# Patient Record
Sex: Male | Born: 1945 | Race: White | Hispanic: No | Marital: Married | Smoking: Former smoker
Health system: Southern US, Community
[De-identification: ages and names within clinical notes are randomized; demographics above are authoritative.]

## PROBLEM LIST (undated history)

## (undated) DIAGNOSIS — J45909 Unspecified asthma, uncomplicated: Secondary | ICD-10-CM

## (undated) DIAGNOSIS — I351 Nonrheumatic aortic (valve) insufficiency: Secondary | ICD-10-CM

## (undated) DIAGNOSIS — I38 Endocarditis, valve unspecified: Secondary | ICD-10-CM

## (undated) DIAGNOSIS — I471 Supraventricular tachycardia, unspecified: Secondary | ICD-10-CM

## (undated) DIAGNOSIS — I340 Nonrheumatic mitral (valve) insufficiency: Secondary | ICD-10-CM

## (undated) DIAGNOSIS — J309 Allergic rhinitis, unspecified: Secondary | ICD-10-CM

## (undated) DIAGNOSIS — K219 Gastro-esophageal reflux disease without esophagitis: Secondary | ICD-10-CM

## (undated) DIAGNOSIS — D649 Anemia, unspecified: Secondary | ICD-10-CM

## (undated) DIAGNOSIS — R011 Cardiac murmur, unspecified: Secondary | ICD-10-CM

## (undated) DIAGNOSIS — I2510 Atherosclerotic heart disease of native coronary artery without angina pectoris: Secondary | ICD-10-CM

## (undated) DIAGNOSIS — N529 Male erectile dysfunction, unspecified: Secondary | ICD-10-CM

## (undated) DIAGNOSIS — I10 Essential (primary) hypertension: Secondary | ICD-10-CM

## (undated) DIAGNOSIS — Z8614 Personal history of Methicillin resistant Staphylococcus aureus infection: Secondary | ICD-10-CM

## (undated) HISTORY — DX: Essential (primary) hypertension: I10

## (undated) HISTORY — DX: Nonrheumatic aortic (valve) insufficiency: I35.1

## (undated) HISTORY — DX: Endocarditis, valve unspecified: I38

## (undated) HISTORY — DX: Anemia, unspecified: D64.9

## (undated) HISTORY — DX: Male erectile dysfunction, unspecified: N52.9

## (undated) HISTORY — PX: CATARACT EXTRACTION W/PHACO: SHX586

## (undated) HISTORY — PX: CARDIAC CATHETERIZATION: SHX172

## (undated) HISTORY — DX: Atherosclerotic heart disease of native coronary artery without angina pectoris: I25.10

## (undated) HISTORY — DX: Supraventricular tachycardia, unspecified: I47.10

## (undated) HISTORY — DX: Allergic rhinitis, unspecified: J30.9

## (undated) HISTORY — PX: ANAL FISSURE REPAIR: SHX2312

## (undated) HISTORY — DX: Unspecified asthma, uncomplicated: J45.909

## (undated) HISTORY — PX: COLONOSCOPY: SHX174

## (undated) HISTORY — PX: TONSILLECTOMY: SUR1361

## (undated) HISTORY — DX: Personal history of Methicillin resistant Staphylococcus aureus infection: Z86.14

## (undated) HISTORY — DX: Nonrheumatic mitral (valve) insufficiency: I34.0

## (undated) HISTORY — PX: OTHER SURGICAL HISTORY: SHX169

---

## 2005-10-03 ENCOUNTER — Emergency Department: Payer: Self-pay | Admitting: Emergency Medicine

## 2010-06-18 ENCOUNTER — Ambulatory Visit: Payer: Self-pay | Admitting: Cardiology

## 2010-07-08 DIAGNOSIS — I2510 Atherosclerotic heart disease of native coronary artery without angina pectoris: Secondary | ICD-10-CM

## 2010-07-08 HISTORY — DX: Atherosclerotic heart disease of native coronary artery without angina pectoris: I25.10

## 2014-01-25 ENCOUNTER — Ambulatory Visit: Payer: Self-pay | Admitting: Unknown Physician Specialty

## 2015-05-28 ENCOUNTER — Telehealth: Payer: Self-pay

## 2015-05-28 NOTE — Telephone Encounter (Signed)
Received notes from Dr. Daniel Nones, pt needs NP appt. .L MOM to return the call.

## 2015-07-12 ENCOUNTER — Ambulatory Visit (INDEPENDENT_AMBULATORY_CARE_PROVIDER_SITE_OTHER): Payer: Medicare Other | Admitting: Cardiovascular Disease

## 2015-07-12 ENCOUNTER — Encounter: Payer: Self-pay | Admitting: Cardiovascular Disease

## 2015-07-12 VITALS — BP 120/70 | HR 66 | Ht 68.0 in | Wt 197.5 lb

## 2015-07-12 DIAGNOSIS — I10 Essential (primary) hypertension: Secondary | ICD-10-CM

## 2015-07-12 DIAGNOSIS — I341 Nonrheumatic mitral (valve) prolapse: Secondary | ICD-10-CM

## 2015-07-12 DIAGNOSIS — I2510 Atherosclerotic heart disease of native coronary artery without angina pectoris: Secondary | ICD-10-CM

## 2015-07-12 DIAGNOSIS — E785 Hyperlipidemia, unspecified: Secondary | ICD-10-CM

## 2015-07-12 DIAGNOSIS — I340 Nonrheumatic mitral (valve) insufficiency: Secondary | ICD-10-CM

## 2015-07-12 NOTE — Assessment & Plan Note (Signed)
Given the presence of mild nonobstructive coronary artery disease, I agree with treatment with a statin with a target LDL of less than 70.

## 2015-07-12 NOTE — Progress Notes (Signed)
Primary care physician: Dr. Daniel Nones  HPI  This is a pleasant 69 year old male who is here today to establish cardiovascular care regarding moderate regurgitation due to mitral valve prolapse. He used to be followed by Dr. Gwen Pounds. He reports prolonged history of mitral regurgitation and a heart murmur. He was evaluated in 2011 at the [facility] clinic. It was concluded that his mitral regurgitation was moderate and not severe and thus surgery was not needed. Cardiac catheterization showed mild three-vessel coronary artery disease at that time. Other medical problems include hypertension. Most recent noninvasive evaluation was in April 2016 which included a stress echocardiogram. The patient exercised for 5 minutes and 41 seconds with no evidence of ischemia and normal ejection fraction. Mitral regurgitation was described as moderate but this was not a full echocardiogram study. The patient overall denies any chest pain, shortness of breath or fatigue. He is physically very active and plays golf on a regular basis. He can walk 5 miles without significant limitations. Family history reveals no premature coronary artery disease. He is not a smoker. 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  No Known Allergies   Current Outpatient Prescriptions on File Prior to Visit  Medication Sig Dispense Refill  . albuterol (PROAIR HFA) 108 (90 BASE) MCG/ACT inhaler Inhale 1 puff into the lungs every 4 (four) hours as needed.     Marland Kitchen amLODipine (NORVASC) 5 MG tablet TAKE 1 TABLET ONE TIME DAILY    . aspirin EC 81 MG tablet Take 81 mg by mouth daily.     . bisoprolol (ZEBETA) 10 MG tablet TAKE 1 TABLET EVERY DAY    . Cetirizine HCl 10 MG CAPS Take 10 mg by mouth daily.     . Coenzyme Q10 Liposomal 100 MG/ML LIQD Take by mouth.    Marland Kitchen lisinopril (PRINIVIL,ZESTRIL) 40 MG tablet TAKE 1 TABLET ONE TIME DAILY    . lovastatin (MEVACOR) 40 MG tablet TAKE 1 TABLET EVERY NIGHT    . mometasone (ASMANEX 60 METERED  DOSES) 220 MCG/INH inhaler Inhale 2 puffs into the lungs daily.     . sodium chloride (OCEAN) 0.65 % nasal spray by Nasal route twice daily.    . tadalafil (CIALIS) 5 MG tablet Take 5 mg by mouth daily as needed.      No current facility-administered medications on file prior to visit.     Past Medical History  Diagnosis Date  . Allergic rhinitis   . Anemia   . Asthma   . ED (erectile dysfunction)   . History of MRSA infection   . Hypertension   . Mitral regurgitation   . Valvular heart disease   . Coronary artery disease 11/11    Diffuse mild 3 vessell CAD by cardiac cath @ the [facility]     Past Surgical History  Procedure Laterality Date  . Colonoscopy    . Anal fissure repair    . Wisdom teeth resected    . Tonsillectomy    . Cardiac catheterization      [facility]     Family History  Problem Relation Age of Onset  . Stroke Mother   . Dementia Mother   . AAA (abdominal aortic aneurysm) Father   . Heart attack Father      Social History   Social History  . Marital Status: Married    Spouse Name: N/A  . Number of Children: N/A  . Years of Education: N/A   Occupational History  . Not on file.  Social History Main Topics  . Smoking status: Former Smoker -- 13 years    Types: Cigarettes  . Smokeless tobacco: Not on file  . Alcohol Use: Yes  . Drug Use: No  . Sexual Activity: Not on file   Other Topics Concern  . Not on file   Social History Narrative     ROS A 10 point review of system was performed. It is negative other than that mentioned in the history of present illness.   PHYSICAL EXAM   BP 120/70 mmHg  Pulse 66  Ht 5\' 8"  (1.727 m)  Wt 197 lb 8 oz (89.585 kg)  BMI 30.04 kg/m2 Constitutional: He is oriented to person, place, and time. He appears well-developed and well-nourished. No distress.  HENT: No nasal discharge.  Head: Normocephalic and atraumatic.  Eyes: Pupils are equal and round.  No discharge. Neck:  Normal range of motion. Neck supple. No JVD present. No thyromegaly present.  Cardiovascular: Normal rate, regular rhythm, normal heart sounds. Exam reveals no gallop and no friction rub. There is a 3/6 holosystolic murmur at the apex radiating to the axilla and the base of the heart.  Pulmonary/Chest: Effort normal and breath sounds normal. No stridor. No respiratory distress. He has no wheezes. He has no rales. He exhibits no tenderness.  Abdominal: Soft. Bowel sounds are normal. He exhibits no distension. There is no tenderness. There is no rebound and no guarding.  Musculoskeletal: Normal range of motion. He exhibits no edema and no tenderness.  Neurological: He is alert and oriented to person, place, and time. Coordination normal.  Skin: Skin is warm and dry. No rash noted. He is not diaphoretic. No erythema. No pallor.  Psychiatric: He has a normal mood and affect. His behavior is normal. Judgment and thought content normal.       EKG: Normal sinus rhythm with PACs.   ASSESSMENT AND PLAN

## 2015-07-12 NOTE — Assessment & Plan Note (Signed)
Blood pressure is controlled

## 2015-07-12 NOTE — Assessment & Plan Note (Signed)
This is due to known mitral valve prolapse. The patient is completely asymptomatic. I recommend an echocardiogram in 6 months to establish a baseline in our office. Infective endocarditis prophylaxis is not recommended in this condition.

## 2015-07-12 NOTE — Patient Instructions (Addendum)
Medication Instructions:  Your physician recommends that you continue on your current medications as directed. Please refer to the Current Medication list given to you today.   Labwork: none  Testing/Procedures: Your physician has requested that you have an echocardiogram in six months. Echocardiography is a painless test that uses sound waves to create images of your heart. It provides your doctor with information about the size and shape of your heart and how well your heart's chambers and valves are working. This procedure takes approximately one hour. There are no restrictions for this procedure.    Follow-Up: Your physician wants you to follow-up in: six months with Dr. Kirke Corin.  You will receive a reminder letter in the mail two months in advance. If you don't receive a letter, please call our office to schedule the follow-up appointment.   Any Other Special Instructions Will Be Listed Below (If Applicable).     If you need a refill on your cardiac medications before your next appointment, please call your pharmacy.  Echocardiogram An echocardiogram, or echocardiography, uses sound waves (ultrasound) to produce an image of your heart. The echocardiogram is simple, painless, obtained within a short period of time, and offers valuable information to your health care provider. The images from an echocardiogram can provide information such as:  Evidence of coronary artery disease (CAD).  Heart size.  Heart muscle function.  Heart valve function.  Aneurysm detection.  Evidence of a past heart attack.  Fluid buildup around the heart.  Heart muscle thickening.  Assess heart valve function. LET [facility] CARE PROVIDER KNOW ABOUT:  Any allergies you have.  All medicines you are taking, including vitamins, herbs, eye drops, creams, and over-the-counter medicines.  Previous problems you or members of your family have had with the use of anesthetics.  Any blood disorders  you have.  Previous surgeries you have had.  Medical conditions you have.  Possibility of pregnancy, if this applies. BEFORE THE PROCEDURE  No special preparation is needed. Eat and drink normally.  PROCEDURE   In order to produce an image of your heart, gel will be applied to your chest and a wand-like tool (transducer) will be moved over your chest. The gel will help transmit the sound waves from the transducer. The sound waves will harmlessly bounce off your heart to allow the heart images to be captured in real-time motion. These images will then be recorded.  You may need an IV to receive a medicine that improves the quality of the pictures. AFTER THE PROCEDURE You may return to your normal schedule including diet, activities, and medicines, unless your health care provider tells you otherwise.   This information is not intended to replace advice given to you by your health care provider. Make sure you discuss any questions you have with your health care provider.   Document Released: 08/21/2000 Document Revised: 09/14/2014 Document Reviewed: 05/01/2013 Elsevier Interactive Patient Education [employer]

## 2015-07-12 NOTE — Assessment & Plan Note (Signed)
He has no anginal symptoms and I agree with current medical therapy.

## 2016-02-11 ENCOUNTER — Other Ambulatory Visit: Payer: Self-pay

## 2016-02-11 ENCOUNTER — Ambulatory Visit (INDEPENDENT_AMBULATORY_CARE_PROVIDER_SITE_OTHER): Payer: Medicare Other

## 2016-02-11 DIAGNOSIS — I341 Nonrheumatic mitral (valve) prolapse: Secondary | ICD-10-CM

## 2016-03-06 DIAGNOSIS — E538 Deficiency of other specified B group vitamins: Secondary | ICD-10-CM | POA: Insufficient documentation

## 2016-03-26 ENCOUNTER — Encounter: Payer: Self-pay | Admitting: Cardiovascular Disease

## 2016-03-26 ENCOUNTER — Ambulatory Visit (INDEPENDENT_AMBULATORY_CARE_PROVIDER_SITE_OTHER): Payer: Medicare Other | Admitting: Cardiovascular Disease

## 2016-03-26 ENCOUNTER — Encounter (INDEPENDENT_AMBULATORY_CARE_PROVIDER_SITE_OTHER): Payer: Self-pay

## 2016-03-26 VITALS — BP 140/80 | HR 61 | Ht 68.0 in | Wt 195.4 lb

## 2016-03-26 DIAGNOSIS — I341 Nonrheumatic mitral (valve) prolapse: Secondary | ICD-10-CM | POA: Diagnosis not present

## 2016-03-26 DIAGNOSIS — E785 Hyperlipidemia, unspecified: Secondary | ICD-10-CM | POA: Diagnosis not present

## 2016-03-26 DIAGNOSIS — I10 Essential (primary) hypertension: Secondary | ICD-10-CM

## 2016-03-26 DIAGNOSIS — I2510 Atherosclerotic heart disease of native coronary artery without angina pectoris: Secondary | ICD-10-CM | POA: Diagnosis not present

## 2016-03-26 NOTE — Patient Instructions (Signed)
Medication Instructions: Continue same medications.   Labwork: None.   Procedures/Testing: None.   Follow-Up: 1 year with Dr. Payton Prinsen.   Any Additional Special Instructions Will Be Listed Below (If Applicable).     If you need a refill on your cardiac medications before your next appointment, please call your pharmacy.   

## 2016-03-26 NOTE — Progress Notes (Signed)
Cardiology Office Note   Date:  03/26/2016   ID:  Connor Tyler, DOB 09-04-1946, MRN [id number]  PCP:  Lynnea Ferrier, MD  Cardiologist:   Lorine Bears, MD   Chief Complaint  Patient presents with  . Follow-up    no sx      History of Present Illness: Connor Tyler is a 70 y.o. male who presents for a follow up visit regarding moderate regurgitation due to mitral valve prolapse.  He reports prolonged history of mitral regurgitation and a heart murmur. He was evaluated in 2011 at the [facility] clinic. It was concluded that his mitral regurgitation was moderate and not severe and thus surgery was not needed. Cardiac catheterization showed mild three-vessel coronary artery disease at that time. Other medical problems include hypertension.  Most recent stress echocardiogram in April 2016 showed no evidence of ischemia.He exercised for 5 minutes and 41 seconds . Most recent echocardiogram in June 2017 showed an ejection fraction of 50-55%, mild mitral and aortic regurgitation with mild pulmonary hypertension. Estimated pulmonary artery systolic pressure was 43 mmHg. He is doing well overall with no chest pain or shortness of breath. He continues to work in Research officer, political party.   Past Medical History  Diagnosis Date  . Allergic rhinitis   . Anemia   . Asthma   . ED (erectile dysfunction)   . History of MRSA infection   . Hypertension   . Mitral regurgitation   . Valvular heart disease   . Coronary artery disease 11/11    Diffuse mild 3 vessell CAD by cardiac cath @ the [facility]    Past Surgical History  Procedure Laterality Date  . Colonoscopy    . Anal fissure repair    . Wisdom teeth resected    . Tonsillectomy    . Cardiac catheterization      [facility]     Current Outpatient Prescriptions  Medication Sig Dispense Refill  . albuterol (PROAIR HFA) 108 (90 BASE) MCG/ACT inhaler Inhale 1 puff into the lungs every 4 (four) hours as needed.     Marland Kitchen  amLODipine (NORVASC) 5 MG tablet TAKE 1 TABLET ONE TIME DAILY    . aspirin EC 81 MG tablet Take 81 mg by mouth daily.     . bisoprolol (ZEBETA) 10 MG tablet TAKE 1 TABLET EVERY DAY    . calcium carbonate (TUMS - DOSED IN MG ELEMENTAL CALCIUM) 500 MG chewable tablet Chew 1 tablet by mouth as needed for indigestion or heartburn.    . Cetirizine HCl 10 MG CAPS Take 10 mg by mouth daily.     . Coenzyme Q10 Liposomal 100 MG/ML LIQD Take by mouth.    Marland Kitchen lisinopril (PRINIVIL,ZESTRIL) 40 MG tablet TAKE 1 TABLET ONE TIME DAILY    . lovastatin (MEVACOR) 40 MG tablet TAKE 1 TABLET EVERY NIGHT    . mometasone (ASMANEX 60 METERED DOSES) 220 MCG/INH inhaler Inhale 2 puffs into the lungs daily.     . sodium chloride (OCEAN) 0.65 % nasal spray by Nasal route twice daily.    . tadalafil (CIALIS) 5 MG tablet Take 5 mg by mouth daily as needed.     . vitamin C (ASCORBIC ACID) 500 MG tablet Take 500 mg by mouth daily.     No current facility-administered medications for this visit.    Allergies:   Review of patient's allergies indicates no known allergies.    Social History:  The patient  reports that he has  quit smoking. His smoking use included Cigarettes. He quit after 13 years of use. He does not have any smokeless tobacco history on file. He reports that he drinks alcohol. He reports that he does not use illicit drugs.   Family History:  The patient's family history includes AAA (abdominal aortic aneurysm) in his father; Dementia in his mother; Heart attack in his father; Stroke in his mother.    ROS:  Please see the history of present illness.   Otherwise, review of systems are positive for none.   All other systems are reviewed and negative.    PHYSICAL EXAM: VS:  BP 140/80 mmHg  Pulse 61  Ht  (1.727 m)  Wt 195 lb 6.4 oz (88.633 kg)  BMI 29.72 kg/m2  SpO2 97% , BMI Body mass index is 29.72 kg/(m^2). GEN: Well nourished, well developed, in no acute distress HEENT: normal Neck: no JVD,  carotid bruits, or masses Cardiac: RRR; no  rubs, or gallops,no edema . There is 2/6 holosystolic murmur at the apex radiating to the axilla Respiratory:  clear to auscultation bilaterally, normal work of breathing GI: soft, nontender, nondistended, + BS MS: no deformity or atrophy Skin: warm and dry, no rash Neuro:  Strength and sensation are intact Psych: euthymic mood, full affect   EKG:  EKG is ordered today. The ekg ordered today demonstrates  normal sinus rhythm with no significant ST or T wave changes.   Recent Labs: No results found for requested labs within last 365 days.    Lipid Panel No results found for: CHOL, TRIG, HDL, CHOLHDL, VLDL, LDLCALC, LDLDIRECT    Wt Readings from Last 3 Encounters:  03/26/16 195 lb 6.4 oz (88.633 kg)  07/12/15 197 lb 8 oz (89.585 kg)        ASSESSMENT AND PLAN:  1.  Mitral regurgitation due to mitral valve prolapse: This has ranged in severity from mild to moderate and most recent echocardiogram was overall reassuring. He is asymptomatic. I am going to follow him on a yearly basis with echocardiogram every 2 years unless there is worsening.  2. Coronary artery disease involving native coronary arteries without angina: Previous cardiac catheterization showed mild nonobstructive coronary artery disease. Continue treatment of risk factors.  3. Essential hypertension: Blood pressure is borderline elevated. Continue treatment with amlodipine, bisoprolol and lisinopril.  5. Hyperlipidemia: Continue treatment with lovastatin. I reviewed most recent lipid profile with him which was done in June 2017. It showed an HDL of 56, triglyceride of 68 and an LDL of 71. No need to change therapy.    Disposition:   FU with me in 1 year  Signed,  Lorine Bears, MD  03/26/2016 10:56 AM    [facility]

## 2017-07-13 ENCOUNTER — Emergency Department
Admission: EM | Admit: 2017-07-13 | Discharge: 2017-07-14 | Disposition: A | Discharge status: Home / Self Care | Payer: Medicare Other | Attending: Emergency Medicine | Admitting: Emergency Medicine

## 2017-07-13 ENCOUNTER — Other Ambulatory Visit: Payer: Self-pay

## 2017-07-13 ENCOUNTER — Encounter: Payer: Self-pay | Admitting: Emergency Medicine

## 2017-07-13 DIAGNOSIS — I10 Essential (primary) hypertension: Secondary | ICD-10-CM | POA: Diagnosis not present

## 2017-07-13 DIAGNOSIS — J45909 Unspecified asthma, uncomplicated: Secondary | ICD-10-CM | POA: Insufficient documentation

## 2017-07-13 DIAGNOSIS — Z87891 Personal history of nicotine dependence: Secondary | ICD-10-CM | POA: Insufficient documentation

## 2017-07-13 DIAGNOSIS — K649 Unspecified hemorrhoids: Secondary | ICD-10-CM | POA: Insufficient documentation

## 2017-07-13 DIAGNOSIS — Z7982 Long term (current) use of aspirin: Secondary | ICD-10-CM | POA: Diagnosis not present

## 2017-07-13 DIAGNOSIS — Z79899 Other long term (current) drug therapy: Secondary | ICD-10-CM | POA: Insufficient documentation

## 2017-07-13 DIAGNOSIS — I2510 Atherosclerotic heart disease of native coronary artery without angina pectoris: Secondary | ICD-10-CM | POA: Diagnosis not present

## 2017-07-13 NOTE — ED Triage Notes (Signed)
Pt states he has a hx of hemorrhoids and has had some recently. States this am had slight bleeding after BM which is not abnormal for him. He has been working the polls all day standing and on the way home he noted heavier bleeding from hemorrhoid. Pt is not on blood thinners other then low dose aspirin. Denies any pain at this time.

## 2017-07-14 DIAGNOSIS — K649 Unspecified hemorrhoids: Secondary | ICD-10-CM | POA: Diagnosis not present

## 2017-07-14 MED ORDER — HYDROCORTISONE ACETATE 25 MG RE SUPP
25.0000 mg | Freq: Once | RECTAL | Status: AC
  Administered 2017-07-14: 25 mg via RECTAL
  Filled 2017-07-14: qty 1

## 2017-07-14 MED ORDER — HYDROCORTISONE ACETATE 25 MG RE SUPP: 25.0000 mg | Freq: Two times a day (BID) | RECTAL | 1 refills | Status: AC | PRN

## 2017-07-14 NOTE — ED Provider Notes (Signed)
[facility] Emergency Department Provider Note    First MD Initiated Contact with Patient 07/13/17 2330     (approximate)  I have reviewed the triage vital signs and the nursing notes.   HISTORY  Chief Complaint Hemorrhoids    HPI Connor Tyler is a 71 y.o. male with below list of medical conditions including hemorrhoids presents emergency department with hemorrhoidal bleeding today. Patient states last BM was this morning at which point he noted some bleeding. Patient became alarmed after noted bleeding tonight. Patient states he took a shower and noted some blood dripping from his anus at that time. Patient denies any pain abdominal or rectal.   Past Medical History:  Diagnosis Date  . Allergic rhinitis   . Anemia   . Asthma   . Coronary artery disease 11/11   Diffuse mild 3 vessell CAD [facility]  . ED (erectile dysfunction)   . History of MRSA infection   . Hypertension   . Mitral regurgitation   . Valvular heart disease     Patient Active Problem List   Diagnosis Date Noted  . Moderate mitral regurgitation 07/12/2015  . Coronary artery disease involving native heart without angina pectoris 07/12/2015  . Essential hypertension 07/12/2015  . Hyperlipidemia 07/12/2015    Past Surgical History:  Procedure Laterality Date  . ANAL FISSURE REPAIR    . CARDIAC CATHETERIZATION     [facility]  . COLONOSCOPY    . TONSILLECTOMY    . wisdom teeth resected      Prior to Admission medications   Medication Sig Start Date End Date Taking? Authorizing Provider  albuterol (PROAIR HFA) 108 (90 BASE) MCG/ACT inhaler Inhale 1 puff into the lungs every 4 (four) hours as needed.     [provider]  amLODipine (NORVASC) 5 MG tablet TAKE 1 TABLET ONE TIME DAILY 07/05/15   [provider]  aspirin EC 81 MG tablet Take 81 mg by mouth daily.     [provider]  bisoprolol (ZEBETA) 10 MG  tablet TAKE 1 TABLET EVERY DAY 07/05/15   [provider]  calcium carbonate (TUMS - DOSED IN MG ELEMENTAL CALCIUM) 500 MG chewable tablet Chew 1 tablet by mouth as needed for indigestion or heartburn.    [provider]  Cetirizine HCl 10 MG CAPS Take 10 mg by mouth daily.     [provider]  Coenzyme Q10 Liposomal 100 MG/ML LIQD Take by mouth.    [provider]  lisinopril (PRINIVIL,ZESTRIL) 40 MG tablet TAKE 1 TABLET ONE TIME DAILY 07/05/15   [provider]  lovastatin (MEVACOR) 40 MG tablet TAKE 1 TABLET EVERY NIGHT 07/05/15   [provider]  mometasone (ASMANEX 60 METERED DOSES) 220 MCG/INH inhaler Inhale 2 puffs into the lungs daily.  03/25/15   [provider]  sodium chloride (OCEAN) 0.65 % nasal spray by Nasal route twice daily.    [provider]  tadalafil (CIALIS) 5 MG tablet Take 5 mg by mouth daily as needed.  05/23/15 05/22/16  [provider]  vitamin C (ASCORBIC ACID) 500 MG tablet Take 500 mg by mouth daily.    [provider]    Allergies No known drug allergies  Family History  Problem Relation Age of Onset  . Stroke Mother   . Dementia Mother   . AAA (abdominal aortic aneurysm) Father   . Heart attack Father     Social History Social  History   Tobacco Use  . Smoking status: Former Smoker    Years: 13.00    Types: Cigarettes  Substance Use Topics  . Alcohol use: Yes  . Drug use: No    Review of Systems Constitutional: No fever/chills Eyes: No visual changes. ENT: No sore throat. Cardiovascular: Denies chest pain. Respiratory: Denies shortness of breath. Gastrointestinal: No abdominal pain.  No nausea, no vomiting.  No diarrhea.  No constipation. Positive for rectal bleeding Genitourinary: Negative for dysuria. Musculoskeletal: Negative for neck pain.  Negative for back pain. Integumentary: Negative for rash. Neurological: Negative for headaches, focal weakness or  numbness.   ____________________________________________   PHYSICAL EXAM:  VITAL SIGNS: ED Triage Vitals  Enc Vitals Group     BP 07/13/17 2016 (!) 170/83     Pulse Rate 07/13/17 2016 81     Resp 07/13/17 2016 20     Temp 07/13/17 2016 98.4 F (36.9 C)     Temp Source 07/13/17 2016 Oral     SpO2 07/13/17 2016 96 %     Weight 07/13/17 2016 86.2 kg (190 lb)     Height 07/13/17 2016 1.727 m (5\' 8" )     Head Circumference --      Peak Flow --      Pain Score 07/13/17 2015 0     Pain Loc --      Pain Edu? --      Excl. in GC? --     Constitutional: Alert and oriented. Well appearing and in no acute distress. Eyes: Conjunctivae are normal.  Head: Atraumatic. Mouth/Throat: Mucous membranes are moist. Neck: No stridor.   Cardiovascular: Normal rate, regular rhythm. Good peripheral circulation. Grossly normal heart sounds. Respiratory: Normal respiratory effort.  No retractions. Lungs CTAB. Gastrointestinal: Soft and nontender. No distention.   Rectal: External hemorrhoid noted with recent evidence of bleeding however no active bleeding at this time. No evidence of thrombosis. Musculoskeletal: No lower extremity tenderness nor edema. No gross deformities of extremities. Neurologic:  Normal speech and language. No gross focal neurologic deficits are appreciated.  Skin:  Skin is warm, dry and intact. No rash noted. Psychiatric: Mood and affect are normal. Speech and behavior are normal.    Procedures   ____________________________________________   INITIAL IMPRESSION / ASSESSMENT AND PLAN / ED COURSE  As part of my medical decision making, I reviewed the following data within the electronic MEDICAL RECORD [id number] year old male presenting with above stated history and physical exam consistent with recent sternal hemorrhoid bleeding which is since resolved. Patient given [facility] emergency department will be prescribed the same for home. Spoke with the patient length regarding  hemorrhoids.     ____________________________________________  FINAL CLINICAL IMPRESSION(S) / ED DIAGNOSES  Final diagnoses:  Hemorrhoids, unspecified hemorrhoid type     MEDICATIONS GIVEN DURING THIS VISIT:  Medications  hydrocortisone (ANUSOL-HC) suppository 25 mg (not administered)     ED Discharge Orders    None       Note:  This document was prepared using Dragon voice recognition software and may include unintentional dictation errors.    Darci Current, MD 07/14/17 Moses Manners

## 2017-07-14 NOTE — ED Notes (Signed)
Pt reports he has hemorrhoids.  Today, pt was on his feet all day at the polls.  This evening pt noticed moisture in his pants and states it got worse.  When he got home from the polls, pt took a shower and noticed more bleeding.  No pain  No abd pain.  Pt uses preparation h with good results daily.  No dizziness.  Pt alert.  Family with pt.

## 2018-01-12 ENCOUNTER — Encounter: Payer: Self-pay | Admitting: *Deleted

## 2018-01-25 ENCOUNTER — Ambulatory Visit: Payer: Medicare Other | Admitting: Certified Registered Nurse Anesthetist

## 2018-01-25 ENCOUNTER — Encounter
Admission: RE | Disposition: A | Discharge status: Home / Self Care | Payer: Self-pay | Source: Ambulatory Visit | Attending: Ophthalmology

## 2018-01-25 ENCOUNTER — Encounter: Payer: Self-pay | Admitting: *Deleted

## 2018-01-25 ENCOUNTER — Ambulatory Visit
Admission: RE | Admit: 2018-01-25 | Discharge: 2018-01-25 | Disposition: A | Discharge status: Home / Self Care | Payer: Medicare Other | Source: Ambulatory Visit | Attending: Ophthalmology | Admitting: Ophthalmology

## 2018-01-25 ENCOUNTER — Other Ambulatory Visit: Payer: Self-pay

## 2018-01-25 DIAGNOSIS — Z87891 Personal history of nicotine dependence: Secondary | ICD-10-CM | POA: Diagnosis not present

## 2018-01-25 DIAGNOSIS — H2512 Age-related nuclear cataract, left eye: Secondary | ICD-10-CM | POA: Insufficient documentation

## 2018-01-25 DIAGNOSIS — I2510 Atherosclerotic heart disease of native coronary artery without angina pectoris: Secondary | ICD-10-CM | POA: Insufficient documentation

## 2018-01-25 DIAGNOSIS — Z79899 Other long term (current) drug therapy: Secondary | ICD-10-CM | POA: Insufficient documentation

## 2018-01-25 DIAGNOSIS — Z7984 Long term (current) use of oral hypoglycemic drugs: Secondary | ICD-10-CM | POA: Diagnosis not present

## 2018-01-25 DIAGNOSIS — E7800 Pure hypercholesterolemia, unspecified: Secondary | ICD-10-CM | POA: Insufficient documentation

## 2018-01-25 DIAGNOSIS — E119 Type 2 diabetes mellitus without complications: Secondary | ICD-10-CM | POA: Diagnosis not present

## 2018-01-25 DIAGNOSIS — I10 Essential (primary) hypertension: Secondary | ICD-10-CM | POA: Insufficient documentation

## 2018-01-25 DIAGNOSIS — J45909 Unspecified asthma, uncomplicated: Secondary | ICD-10-CM | POA: Diagnosis not present

## 2018-01-25 HISTORY — PX: CATARACT EXTRACTION W/PHACO: SHX586

## 2018-01-25 HISTORY — DX: Gastro-esophageal reflux disease without esophagitis: K21.9

## 2018-01-25 SURGERY — PHACOEMULSIFICATION, CATARACT, WITH IOL INSERTION
Anesthesia: Monitor Anesthesia Care | Site: Eye | Laterality: Left | Wound class: "Clean "

## 2018-01-25 MED ORDER — ONDANSETRON HCL 4 MG/2ML IJ SOLN: 4.0000 mg | Freq: Once | INTRAMUSCULAR | Status: DC | PRN

## 2018-01-25 MED ORDER — CARBACHOL 0.01 % IO SOLN
INTRAOCULAR | Status: DC | PRN
  Administered 2018-01-25: .5 mL via INTRAOCULAR

## 2018-01-25 MED ORDER — POVIDONE-IODINE 5 % OP SOLN
OPHTHALMIC | Status: DC | PRN
  Administered 2018-01-25: 1 via OPHTHALMIC

## 2018-01-25 MED ORDER — MOXIFLOXACIN HCL 0.5 % OP SOLN
OPHTHALMIC | Status: DC | PRN
  Administered 2018-01-25: .2 mL via OPHTHALMIC

## 2018-01-25 MED ORDER — SODIUM CHLORIDE 0.9 % IV SOLN
INTRAVENOUS | Status: DC
  Administered 2018-01-25: 07:00:00 via INTRAVENOUS

## 2018-01-25 MED ORDER — MIDAZOLAM HCL 2 MG/2ML IJ SOLN
INTRAMUSCULAR | Status: AC
  Filled 2018-01-25: qty 2

## 2018-01-25 MED ORDER — MIDAZOLAM HCL 2 MG/2ML IJ SOLN
INTRAMUSCULAR | Status: DC | PRN
  Administered 2018-01-25 (×2): 1 mg via INTRAVENOUS

## 2018-01-25 MED ORDER — EPINEPHRINE PF 1 MG/ML IJ SOLN
INTRAOCULAR | Status: DC | PRN
  Administered 2018-01-25: 1 mL via OPHTHALMIC

## 2018-01-25 MED ORDER — LIDOCAINE HCL (PF) 4 % IJ SOLN
INTRAOCULAR | Status: DC | PRN
  Administered 2018-01-25: 2 mL via OPHTHALMIC

## 2018-01-25 MED ORDER — FENTANYL CITRATE (PF) 100 MCG/2ML IJ SOLN: 25.0000 ug | INTRAMUSCULAR | Status: DC | PRN

## 2018-01-25 MED ORDER — MOXIFLOXACIN HCL 0.5 % OP SOLN: 1.0000 [drp] | OPHTHALMIC | Status: DC | PRN

## 2018-01-25 MED ORDER — NA CHONDROIT SULF-NA HYALURON 40-17 MG/ML IO SOLN
INTRAOCULAR | Status: DC | PRN
  Administered 2018-01-25: 1 mL via INTRAOCULAR

## 2018-01-25 MED ORDER — ARMC OPHTHALMIC DILATING DROPS
1.0000 "application " | OPHTHALMIC | Status: AC
  Administered 2018-01-25 (×3): 1 via OPHTHALMIC

## 2018-01-25 SURGICAL SUPPLY — 16 items
GLOVE BIO SURGEON STRL SZ8 (GLOVE) ×3 IMPLANT
GLOVE BIOGEL M 6.5 STRL (GLOVE) ×3 IMPLANT
GLOVE SURG LX 8.0 MICRO (GLOVE) ×2
GLOVE SURG LX STRL 8.0 MICRO (GLOVE) ×1 IMPLANT
GOWN STRL REUS W/ TWL LRG LVL3 (GOWN DISPOSABLE) ×2 IMPLANT
GOWN STRL REUS W/TWL LRG LVL3 (GOWN DISPOSABLE) ×4
LABEL CATARACT MEDS ST (LABEL) ×3 IMPLANT
LENS IOL TECNIS ITEC 17.5 (Intraocular Lens) ×2 IMPLANT
PACK CATARACT (MISCELLANEOUS) ×3 IMPLANT
PACK CATARACT BRASINGTON LX (MISCELLANEOUS) ×3 IMPLANT
PACK EYE AFTER SURG (MISCELLANEOUS) ×3 IMPLANT
SOL BSS BAG (MISCELLANEOUS) ×3
SOLUTION BSS BAG (MISCELLANEOUS) ×1 IMPLANT
SYR 5ML LL (SYRINGE) ×3 IMPLANT
WATER STERILE IRR 250ML POUR (IV SOLUTION) ×3 IMPLANT
WIPE NON LINTING 3.25X3.25 (MISCELLANEOUS) ×3 IMPLANT

## 2018-01-25 NOTE — Anesthesia Preprocedure Evaluation (Signed)
Anesthesia Evaluation  Patient identified by MRN, date of birth, ID band Patient awake    Reviewed: Allergy & Precautions, H&P , NPO status , Patient's Chart, lab work & pertinent test results, reviewed documented beta blocker date and time   Airway Mallampati: II  TM Distance: >3 FB Neck ROM: full    Dental no notable dental hx. (+) Teeth Intact   Pulmonary neg pulmonary ROS, asthma , former smoker,    Pulmonary exam normal breath sounds clear to auscultation       Cardiovascular Exercise Tolerance: Good hypertension, On Medications + CAD  negative cardio ROS   Rhythm:regular Rate:Normal     Neuro/Psych negative neurological ROS  negative psych ROS   GI/Hepatic negative GI ROS, Neg liver ROS, GERD  ,  Endo/Other  negative endocrine ROSdiabetes, Well Controlled, Type 2, Oral Hypoglycemic Agents  Renal/GU      Musculoskeletal   Abdominal   Peds  Hematology negative hematology ROS (+) anemia ,   Anesthesia Other Findings   Reproductive/Obstetrics negative OB ROS                             Anesthesia Physical Anesthesia Plan  ASA: III  Anesthesia Plan: MAC   Post-op Pain Management:    Induction:   PONV Risk Score and Plan:   Airway Management Planned:   Additional Equipment:   Intra-op Plan:   Post-operative Plan:   Informed Consent: I have reviewed the patients History and Physical, chart, labs and discussed the procedure including the risks, benefits and alternatives for the proposed anesthesia with the patient or authorized representative who has indicated his/her understanding and acceptance.     Plan Discussed with: CRNA  Anesthesia Plan Comments:         Anesthesia Quick Evaluation

## 2018-01-25 NOTE — Op Note (Signed)
PREOPERATIVE DIAGNOSIS:  Nuclear sclerotic cataract of the left eye.   POSTOPERATIVE DIAGNOSIS:  Nuclear sclerotic cataract of the left eye.   OPERATIVE PROCEDURE: Procedure(s): CATARACT EXTRACTION PHACO AND INTRAOCULAR LENS PLACEMENT (IOC)   SURGEON:  Galen Manila, MD.   ANESTHESIA:  Anesthesiologist: Yevette Edwards, MD CRNA: Dava Najjar, CRNA; Casey Burkitt, CRNA  1.      Managed anesthesia care. 2.     0.50ml of Shugarcaine was instilled following the paracentesis   COMPLICATIONS:  None.   TECHNIQUE:   Stop and chop   DESCRIPTION OF PROCEDURE:  The patient was examined and consented in the preoperative holding area where the aforementioned topical anesthesia was applied to the left eye and then brought back to the Operating Room where the left eye was prepped and draped in the usual sterile ophthalmic fashion and a lid speculum was placed. A paracentesis was created with the side port blade and the anterior chamber was filled with viscoelastic. A near clear corneal incision was performed with the steel keratome. A continuous curvilinear capsulorrhexis was performed with a cystotome followed by the capsulorrhexis forceps. Hydrodissection and hydrodelineation were carried out with BSS on a blunt cannula. The lens was removed in a stop and chop  technique and the remaining cortical material was removed with the irrigation-aspiration handpiece. The capsular bag was inflated with viscoelastic and the Technis [id number] lens was placed in the capsular bag without complication. The remaining viscoelastic was removed from the eye with the irrigation-aspiration handpiece. The wounds were hydrated. The anterior chamber was flushed with Miostat and the eye was inflated to physiologic pressure. 0.65ml Vigamox was placed in the anterior chamber. The wounds were found to be water tight. The eye was dressed with Vigamox. The patient was given protective glasses to wear throughout the day and a shield with  which to sleep tonight. The patient was also given drops with which to begin a drop regimen today and will follow-up with me in one day. Implant Name Type Inv. Item Serial No. Manufacturer Lot No. LRB No. Used  LENS IOL DIOP 17.5 - [id number] Intraocular Lens LENS IOL DIOP 17.5 [id number]  Left 1    Procedure(s) with comments: CATARACT EXTRACTION PHACO AND INTRAOCULAR LENS PLACEMENT (IOC) (Left) - [place] 00:49 AP% 13.3 CDE 6.60 Fluid pack lot # [id number]  Electronically signed: Galen Manila 01/25/2018 8:50 AM

## 2018-01-25 NOTE — Discharge Instructions (Signed)
Eye Surgery Discharge Instructions  Expect mild scratchy sensation or mild soreness. DO NOT RUB YOUR EYE!  The day of surgery:  Minimal physical activity, but bed rest is not required  No reading, computer work, or close hand work  No bending, lifting, or straining.  May watch TV  For 24 hours:  No driving, legal decisions, or alcoholic beverages  Safety precautions  Eat anything you prefer: It is better to start with liquids, then soup then solid foods.  _____ Eye patch should be worn until postoperative exam tomorrow.  ____ Solar shield eyeglasses should be worn for comfort in the sunlight/patch while sleeping  Resume all regular medications including aspirin or Coumadin if these were discontinued prior to surgery. You may shower, bathe, shave, or wash your hair. Tylenol may be taken for mild discomfort.  Call your doctor if you experience significant pain, nausea, or vomiting, fever > 101 or other signs of infection. [phone] or [phone] Specific instructions:  Follow-up Information    Galen Manila, MD Follow up.   Specialty:  Ophthalmology Why:  May 22 at 1:20pm Contact information: [address] [place] [address] [phone]

## 2018-01-25 NOTE — Anesthesia Procedure Notes (Signed)
Procedure Name: MAC Performed by: Eben Burow, CRNA Pre-anesthesia Checklist: Emergency Drugs available, Suction available, Patient being monitored, Timeout performed and Patient identified Oxygen Delivery Method: Nasal cannula

## 2018-01-25 NOTE — Transfer of Care (Signed)
Immediate Anesthesia Transfer of Care Note  Patient: Connor Tyler  Procedure(s) Performed: CATARACT EXTRACTION PHACO AND INTRAOCULAR LENS PLACEMENT (IOC) (Left Eye)  Patient Location: PACU  Anesthesia Type:MAC  Level of Consciousness: awake, alert , oriented and patient cooperative  Airway & Oxygen Therapy: Patient Spontanous Breathing  Post-op Assessment: Report given to RN and Post -op Vital signs reviewed and stable  Post vital signs: Reviewed and stable  Last Vitals:  Vitals Value Taken Time  BP    Temp    Pulse    Resp    SpO2      Last Pain:  Vitals:   01/25/18 0703  TempSrc: Oral  PainSc: 4          Complications: No apparent anesthesia complications

## 2018-01-25 NOTE — H&P (Signed)
All labs reviewed. Abnormal studies sent to patients PCP when indicated.  Previous H&P reviewed, patient examined, there are NO CHANGES.  Connor Bruss Porfilio5/21/20198:26 AM

## 2018-01-25 NOTE — Anesthesia Post-op Follow-up Note (Signed)
Anesthesia QCDR form completed.        

## 2018-01-25 NOTE — Anesthesia Postprocedure Evaluation (Signed)
Anesthesia Post Note  Patient: Connor Tyler  Procedure(s) Performed: CATARACT EXTRACTION PHACO AND INTRAOCULAR LENS PLACEMENT ([place]) (Left Eye)  Patient location during evaluation: Short Stay Anesthesia Type: MAC Level of consciousness: awake and alert and patient cooperative Pain management: pain level controlled Vital Signs Assessment: post-procedure vital signs reviewed and stable Respiratory status: spontaneous breathing and respiratory function stable Cardiovascular status: blood pressure returned to baseline Postop Assessment: no headache and no apparent nausea or vomiting Anesthetic complications: no     Last Vitals:  Vitals:   01/25/18 0849 01/25/18 0855  BP: 137/79 140/80  Pulse: (!) 59   Resp: 16   Temp: (!) 36.1 C   SpO2: 97%     Last Pain:  Vitals:   01/25/18 0703  TempSrc: Oral  PainSc: 4                  Eben Burow

## 2018-02-23 ENCOUNTER — Encounter: Payer: Self-pay | Admitting: *Deleted

## 2018-03-01 ENCOUNTER — Ambulatory Visit: Admit: 2018-03-01 | Payer: No Typology Code available for payment source | Admitting: Ophthalmology

## 2018-03-01 HISTORY — DX: Cardiac murmur, unspecified: R01.1

## 2018-03-01 SURGERY — PHACOEMULSIFICATION, CATARACT, WITH IOL INSERTION
Anesthesia: Choice | Laterality: Right

## 2018-06-16 ENCOUNTER — Telehealth: Payer: Self-pay | Admitting: Cardiovascular Disease

## 2018-06-16 NOTE — Telephone Encounter (Signed)
Spoke with Dr Kirke Corin who said ok for patient to remain on his schedule for tomorrow.

## 2018-06-16 NOTE — Telephone Encounter (Signed)
Patient has not been seen in our office since 2017. Patient scheduled to see Dr Kirke Corin tomorrow, 06/17/18. Routing to Dr Kirke Corin for review for clearance.

## 2018-06-16 NOTE — Telephone Encounter (Signed)
° °  [facility] Pre-operative Risk Assessment    Request for surgical clearance:  1. What type of surgery is being performed? Colonoscopy and Upper Endoscopy    2. When is this surgery scheduled?   3. What type of clearance is required (medical clearance vs. Pharmacy clearance to hold med vs. Both)?   4. Are there any medications that need to be held prior to surgery and how long?  5. Practice name and name of physician performing surgery?  [facility]   6. What is your office phone number [phone]   7.   What is your office fax number [phone]  8.   Anesthesia type (None, local, MAC, general) ? Monitored   (Patient is scheduled to see Dr Fletcher Anon on 08/25/18)

## 2018-06-16 NOTE — Telephone Encounter (Signed)
He needs repeat echo for mitral regurgitation.

## 2018-06-17 ENCOUNTER — Ambulatory Visit (INDEPENDENT_AMBULATORY_CARE_PROVIDER_SITE_OTHER): Payer: Medicare Other | Admitting: Cardiovascular Disease

## 2018-06-17 VITALS — BP 148/60 | HR 61 | Ht 67.5 in | Wt 197.0 lb

## 2018-06-17 DIAGNOSIS — I10 Essential (primary) hypertension: Secondary | ICD-10-CM | POA: Diagnosis not present

## 2018-06-17 DIAGNOSIS — E785 Hyperlipidemia, unspecified: Secondary | ICD-10-CM | POA: Diagnosis not present

## 2018-06-17 DIAGNOSIS — I340 Nonrheumatic mitral (valve) insufficiency: Secondary | ICD-10-CM | POA: Diagnosis not present

## 2018-06-17 DIAGNOSIS — I2510 Atherosclerotic heart disease of native coronary artery without angina pectoris: Secondary | ICD-10-CM | POA: Diagnosis not present

## 2018-06-17 NOTE — Patient Instructions (Signed)
Medication Instructions:  No changes If you need a refill on your cardiac medications before your next appointment, please call your pharmacy.   Lab work: None at this time. If you have labs (blood work) drawn today and your tests are completely normal, you will receive your results only by: Marland Kitchen MyChart Message (if you have MyChart) OR . A paper copy in the mail If you have any lab test that is abnormal or we need to change your treatment, we will call you to review the results.  Testing/Procedures: Your physician has requested that you have an echocardiogram. Echocardiography is a painless test that uses sound waves to create images of your heart. It provides your doctor with information about the size and shape of your heart and how well your heart's chambers and valves are working. This procedure takes approximately one hour. There are no restrictions for this procedure.    Follow-Up: At [facility], you and your health needs are our priority.  As part of our continuing mission to provide you with exceptional heart care, we have created designated Provider Care Teams.  These Care Teams include your primary Cardiologist (physician) and Advanced Practice Providers (APPs -  Physician Assistants and Nurse Practitioners) who all work together to provide you with the care you need, when you need it. You will need a follow up appointment in 12 months.  Please call our office 2 months in advance to schedule this appointment.  You may see Dr. Kirke Corin or one of the following Advanced Practice Providers on your designated Care Team:   Nicolasa Ducking, NP Eula Listen, PA-C . Marisue Ivan, PA-C  Any Other Special Instructions Will Be Listed Below (If Applicable).

## 2018-06-17 NOTE — Progress Notes (Signed)
Cardiology Office Note   Date:  06/17/2018   ID:  Connor Tyler, DOB [birth year], MRN [id number]  PCP:  Lynnea Ferrier, MD  Cardiologist:   Lorine Bears, MD   Chief Complaint  Patient presents with  . other    1 yr follow up. Medications reviewed verbally.       History of Present Illness: Connor Tyler is a 72 y.o. male who presents for a follow up visit regarding moderate regurgitation due to mitral valve prolapse.  He reports prolonged history of mitral regurgitation and a heart murmur. He was evaluated in 2011 at the [facility] clinic. It was concluded that his mitral regurgitation was moderate and not severe and thus surgery was not needed. Cardiac catheterization showed mild three-vessel coronary artery disease at that time. Other medical problems include hypertension.  Most recent stress echocardiogram in April 2016 showed no evidence of ischemia. He exercised for 5 minutes and 41 seconds . Most recent echocardiogram in June 2017 showed an ejection fraction of 50-55%, mild mitral and aortic regurgitation with mild pulmonary hypertension. Estimated pulmonary artery systolic pressure was 43 mmHg.  He has been doing well with no recent chest pain, shortness of breath or palpitations.  He continues to be very active.  He walked 5 miles yesterday without limitations. 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    Past Medical History:  Diagnosis Date  . Allergic rhinitis   . Anemia   . Asthma   . Coronary artery disease 11/11   Diffuse mild 3 vessell CAD by cardiac cath @ the [facility]  . ED (erectile dysfunction)   . GERD (gastroesophageal reflux disease)   . Heart murmur   . History of MRSA infection   . Hypertension   . Mitral regurgitation   . Valvular heart disease     Past Surgical History:  Procedure Laterality Date  . ANAL FISSURE REPAIR    . CARDIAC CATHETERIZATION     [facility]  . CATARACT EXTRACTION W/PHACO Left  01/25/2018   Procedure: CATARACT EXTRACTION PHACO AND INTRAOCULAR LENS PLACEMENT (IOC);  Surgeon: Galen Manila, MD;  Location: [facility] ORS;  Service: Ophthalmology;  Laterality: Left;  [place] 00:49 AP% 13.3 CDE 6.60 Fluid pack lot # [id number]  . COLONOSCOPY    . TONSILLECTOMY    . wisdom teeth resected       Current Outpatient Medications  Medication Sig Dispense Refill  . albuterol (PROAIR HFA) 108 (90 BASE) MCG/ACT inhaler Inhale 1 puff into the lungs every 4 (four) hours as needed for wheezing or shortness of breath.     Marland Kitchen amLODipine (NORVASC) 5 MG tablet Take 2.5 mg by mouth once daily    . aspirin EC 81 MG tablet Take 81 mg by mouth daily.     . bisoprolol (ZEBETA) 10 MG tablet TAKE 1 TABLET EVERY DAY    . cetirizine (ZYRTEC) 10 MG tablet Take 10 mg by mouth daily.     Marland Kitchen lisinopril (PRINIVIL,ZESTRIL) 40 MG tablet TAKE 1 TABLET ONE TIME DAILY    . lovastatin (MEVACOR) 40 MG tablet TAKE 1 TABLET EVERY NIGHT    . Melatonin 5 MG CAPS Take 5 mg by mouth at bedtime as needed (for sleep).    . mometasone (ASMANEX 60 METERED DOSES) 220 MCG/INH inhaler Inhale 1 puff into the lungs at bedtime.     Marland Kitchen omeprazole (PRILOSEC) 20 MG capsule Take 20 mg by mouth daily.    . vitamin B-12 (  CYANOCOBALAMIN) 1000 MCG tablet Take 1,000 mcg by mouth daily.    . vitamin C (ASCORBIC ACID) 500 MG tablet Take 500 mg by mouth daily.     No current facility-administered medications for this visit.     Allergies:   Patient has no known allergies.    Social History:  The patient  reports that he has quit smoking. His smoking use included cigarettes. He quit after 13.00 years of use. He has never used smokeless tobacco. He reports that he drinks alcohol. He reports that he does not use drugs.   Family History:  The patient's family history includes AAA (abdominal aortic aneurysm) in his father; Dementia in his mother; Heart attack in his father; Stroke in his mother.    ROS:  Please see the history of present  illness.   Otherwise, review of systems are positive for none.   All other systems are reviewed and negative.    PHYSICAL EXAM: VS:  BP (!) 148/60 (BP Location: Left Arm, Patient Position: Sitting, Cuff Size: Normal)   Pulse 61   Ht 5' 7.5" (1.715 m)   Wt 197 lb (89.4 kg)   BMI 30.40 kg/m  , BMI Body mass index is 30.4 kg/m. GEN: Well nourished, well developed, in no acute distress  HEENT: normal  Neck: no JVD, carotid bruits, or masses Cardiac: RRR; no  rubs, or gallops,no edema . There is 2/6 holosystolic murmur at the apex radiating to the axilla Respiratory:  clear to auscultation bilaterally, normal work of breathing GI: soft, nontender, nondistended, + BS MS: no deformity or atrophy  Skin: warm and dry, no rash Neuro:  Strength and sensation are intact Psych: euthymic mood, full affect   EKG:  EKG is ordered today. The ekg ordered today demonstrates  normal sinus rhythm with no significant ST or T wave changes.   Recent Labs: No results found for requested labs within last 8760 hours.    Lipid Panel No results found for: CHOL, TRIG, HDL, CHOLHDL, VLDL, LDLCALC, LDLDIRECT    Wt Readings from Last 3 Encounters:  06/17/18 197 lb (89.4 kg)  01/12/18 195 lb (88.5 kg)  07/13/17 190 lb (86.2 kg)        ASSESSMENT AND PLAN:  1.  Mitral regurgitation due to mitral valve prolapse: Recommend a follow-up echocardiogram which was ordered today.  2. Coronary artery disease involving native coronary arteries without angina: Previous cardiac catheterization showed mild nonobstructive coronary artery disease. Continue treatment of risk factors.  3. Essential hypertension: Blood pressure is borderline elevated. Continue treatment with amlodipine, bisoprolol and lisinopril.  He reports better blood pressure readings at home and in recent office visit.  4. Hyperlipidemia: Continue treatment with lovastatin.  Most recent lipid profile in September showed an LDL of 73.  5.   Preop cardiovascular evaluation for colonoscopy: The patient has no cardiac symptoms and he is very active.  EKG is unremarkable.  He can proceed at low risk.  No need for further testing before the procedure.   Disposition:   FU with me in 1 year  Signed,  Lorine Bears, MD  06/17/2018 3:20 PM    [facility]

## 2018-06-20 NOTE — Telephone Encounter (Signed)
Patient has echo scheduled for 06/30/18. Patient saw Dr Kirke Corin on 06/17/18. Dr Kirke Corin put clearance in his office visit note. Office visit note and this encounter faxed to number provided.

## 2018-06-20 NOTE — Addendum Note (Signed)
Addended by: Aurelio Jew on: 06/20/2018 12:02 PM   Modules accepted: Orders

## 2018-06-30 ENCOUNTER — Other Ambulatory Visit: Payer: Self-pay

## 2018-06-30 ENCOUNTER — Ambulatory Visit (INDEPENDENT_AMBULATORY_CARE_PROVIDER_SITE_OTHER): Payer: Medicare Other

## 2018-06-30 DIAGNOSIS — I340 Nonrheumatic mitral (valve) insufficiency: Secondary | ICD-10-CM

## 2018-08-25 ENCOUNTER — Ambulatory Visit: Payer: No Typology Code available for payment source | Admitting: Cardiovascular Disease

## 2018-09-14 ENCOUNTER — Ambulatory Visit
Admission: RE | Admit: 2018-09-14 | Discharge: 2018-09-14 | Disposition: A | Discharge status: Home / Self Care | Payer: Medicare Other | Attending: Unknown Physician Specialty | Admitting: Unknown Physician Specialty

## 2018-09-14 ENCOUNTER — Encounter: Payer: Self-pay | Admitting: *Deleted

## 2018-09-14 ENCOUNTER — Ambulatory Visit: Payer: Medicare Other | Admitting: Anesthesiology

## 2018-09-14 ENCOUNTER — Encounter
Admission: RE | Disposition: A | Discharge status: Home / Self Care | Payer: Self-pay | Source: Home / Self Care | Attending: Unknown Physician Specialty

## 2018-09-14 DIAGNOSIS — Z8614 Personal history of Methicillin resistant Staphylococcus aureus infection: Secondary | ICD-10-CM | POA: Insufficient documentation

## 2018-09-14 DIAGNOSIS — K210 Gastro-esophageal reflux disease with esophagitis: Secondary | ICD-10-CM | POA: Insufficient documentation

## 2018-09-14 DIAGNOSIS — K259 Gastric ulcer, unspecified as acute or chronic, without hemorrhage or perforation: Secondary | ICD-10-CM | POA: Diagnosis not present

## 2018-09-14 DIAGNOSIS — Z7982 Long term (current) use of aspirin: Secondary | ICD-10-CM | POA: Diagnosis not present

## 2018-09-14 DIAGNOSIS — K319 Disease of stomach and duodenum, unspecified: Secondary | ICD-10-CM | POA: Diagnosis not present

## 2018-09-14 DIAGNOSIS — K219 Gastro-esophageal reflux disease without esophagitis: Secondary | ICD-10-CM | POA: Diagnosis present

## 2018-09-14 DIAGNOSIS — K640 First degree hemorrhoids: Secondary | ICD-10-CM | POA: Diagnosis not present

## 2018-09-14 DIAGNOSIS — J45909 Unspecified asthma, uncomplicated: Secondary | ICD-10-CM | POA: Insufficient documentation

## 2018-09-14 DIAGNOSIS — I10 Essential (primary) hypertension: Secondary | ICD-10-CM | POA: Diagnosis not present

## 2018-09-14 DIAGNOSIS — Z79899 Other long term (current) drug therapy: Secondary | ICD-10-CM | POA: Diagnosis not present

## 2018-09-14 DIAGNOSIS — Z87891 Personal history of nicotine dependence: Secondary | ICD-10-CM | POA: Insufficient documentation

## 2018-09-14 DIAGNOSIS — K5730 Diverticulosis of large intestine without perforation or abscess without bleeding: Secondary | ICD-10-CM | POA: Diagnosis not present

## 2018-09-14 DIAGNOSIS — K625 Hemorrhage of anus and rectum: Secondary | ICD-10-CM | POA: Diagnosis not present

## 2018-09-14 DIAGNOSIS — I2510 Atherosclerotic heart disease of native coronary artery without angina pectoris: Secondary | ICD-10-CM | POA: Insufficient documentation

## 2018-09-14 DIAGNOSIS — K2970 Gastritis, unspecified, without bleeding: Secondary | ICD-10-CM | POA: Diagnosis not present

## 2018-09-14 HISTORY — PX: ESOPHAGOGASTRODUODENOSCOPY (EGD) WITH PROPOFOL: SHX5813

## 2018-09-14 HISTORY — PX: COLONOSCOPY WITH PROPOFOL: SHX5780

## 2018-09-14 SURGERY — COLONOSCOPY WITH PROPOFOL
Anesthesia: General

## 2018-09-14 MED ORDER — LIDOCAINE HCL (CARDIAC) PF 100 MG/5ML IV SOSY
PREFILLED_SYRINGE | INTRAVENOUS | Status: DC | PRN
  Administered 2018-09-14: 80 mg via INTRAVENOUS

## 2018-09-14 MED ORDER — FENTANYL CITRATE (PF) 100 MCG/2ML IJ SOLN
INTRAMUSCULAR | Status: AC
  Filled 2018-09-14: qty 2

## 2018-09-14 MED ORDER — PROPOFOL 10 MG/ML IV BOLUS
INTRAVENOUS | Status: DC | PRN
  Administered 2018-09-14: 60 mg via INTRAVENOUS
  Administered 2018-09-14: 40 mg via INTRAVENOUS

## 2018-09-14 MED ORDER — PROPOFOL 10 MG/ML IV BOLUS
INTRAVENOUS | Status: AC
  Filled 2018-09-14: qty 40

## 2018-09-14 MED ORDER — SODIUM CHLORIDE 0.9 % IV SOLN
INTRAVENOUS | Status: DC
  Administered 2018-09-14: 08:00:00 via INTRAVENOUS

## 2018-09-14 MED ORDER — PROPOFOL 500 MG/50ML IV EMUL
INTRAVENOUS | Status: DC | PRN
  Administered 2018-09-14: 100 ug/kg/min via INTRAVENOUS

## 2018-09-14 MED ORDER — SODIUM CHLORIDE 0.9 % IV SOLN: INTRAVENOUS | Status: DC

## 2018-09-14 MED ORDER — PIPERACILLIN-TAZOBACTAM 3.375 G IVPB 30 MIN
3.3750 g | Freq: Once | INTRAVENOUS | Status: AC
  Administered 2018-09-14: 3.375 g via INTRAVENOUS
  Filled 2018-09-14: qty 50

## 2018-09-14 MED ORDER — FENTANYL CITRATE (PF) 100 MCG/2ML IJ SOLN
INTRAMUSCULAR | Status: DC | PRN
  Administered 2018-09-14: 50 ug via INTRAVENOUS

## 2018-09-14 MED ORDER — PIPERACILLIN-TAZOBACTAM 3.375 G IVPB
INTRAVENOUS | Status: AC
  Filled 2018-09-14: qty 50

## 2018-09-14 NOTE — Op Note (Addendum)
[facility] Gastroenterology Patient Name: Connor Tyler Procedure Date: 09/14/2018 8:01 AM MRN: [id number] Account #: [id number] Date of Birth: [birth year] Admit Type: Outpatient Age: 73 Room: [facility] ENDO ROOM 2 Gender: Male Note Status: Finalized Procedure:            Upper GI endoscopy Indications:          Heartburn, Follow-up of gastro-esophageal reflux disease Providers:            Scot Jun, MD Referring MD:         Daniel Nones, MD (Referring MD) Medicines:            Propofol per Anesthesia Complications:        No immediate complications. Procedure:            Pre-Anesthesia Assessment:                       - After reviewing the risks and benefits, the patient                        was deemed in satisfactory condition to undergo the                        procedure.                       After obtaining informed consent, the endoscope was                        passed under direct vision. Throughout the procedure,                        the patient's blood pressure, pulse, and oxygen                        saturations were monitored continuously. The Endoscope                        was introduced through the mouth, and advanced to the                        second part of duodenum. The upper GI endoscopy was                        accomplished without difficulty. The patient tolerated                        the procedure well. Findings:      LA Grade A (one or more mucosal breaks less than 5 mm, not extending       between tops of 2 mucosal folds) esophagitis with no bleeding was found       45 cm from the incisors. Biopsies were taken with a cold forceps for       histology.      Localized mild inflammation characterized by erythema and granularity       was found in the gastric antrum. Biopsies were taken with a cold forceps       for histology. Biopsies were taken with a cold forceps for Helicobacter       pylori testing. The body of the  stomach looked normal.      The examined duodenum  was normal. Impression:           - LA Grade A reflux esophagitis. Rule out Barrett's                        esophagus. Biopsied.                       - Gastritis. Biopsied.                       - Normal examined duodenum. Recommendation:       - Await pathology results. Scot Jun, MD 09/14/2018 8:31:20 AM This report has been signed electronically. Number of Addenda: 0 Note Initiated On: 09/14/2018 8:01 AM      [facility]

## 2018-09-14 NOTE — Anesthesia Postprocedure Evaluation (Signed)
Anesthesia Post Note  Patient: YOSIF PAULES  Procedure(s) Performed: COLONOSCOPY WITH PROPOFOL (N/A ) ESOPHAGOGASTRODUODENOSCOPY (EGD) WITH PROPOFOL (N/A )  Patient location during evaluation: Endoscopy Anesthesia Type: General Level of consciousness: awake and alert and oriented Pain management: pain level controlled Vital Signs Assessment: post-procedure vital signs reviewed and stable Respiratory status: spontaneous breathing, nonlabored ventilation and respiratory function stable Cardiovascular status: blood pressure returned to baseline and stable Postop Assessment: no signs of nausea or vomiting Anesthetic complications: no     Last Vitals:  Vitals:   09/14/18 0900 09/14/18 0910  BP: 118/61 (!) 164/79  Pulse: 62 61  Resp: 16 10  Temp:    SpO2: 97% 97%    Last Pain:  Vitals:   09/14/18 0840  TempSrc: Tympanic  PainSc:                  Khadeeja Elden

## 2018-09-14 NOTE — Op Note (Addendum)
[facility] Gastroenterology Patient Name: Connor Tyler Procedure Date: 09/14/2018 8:00 AM MRN: [id number] Account #: [id number] Date of Birth: [birth year] Admit Type: Outpatient Age: 73 Room: [facility] ENDO ROOM 2 Gender: Male Note Status: Finalized Procedure:            Colonoscopy Indications:          Rectal bleeding Providers:            Scot Jun, MD Referring MD:         Daniel Nones, MD (Referring MD) Medicines:            Propofol per Anesthesia Complications:        No immediate complications. Procedure:            Pre-Anesthesia Assessment:                       - After reviewing the risks and benefits, the patient                        was deemed in satisfactory condition to undergo the                        procedure.                       After obtaining informed consent, the colonoscope was                        passed under direct vision. Throughout the procedure,                        the patient's blood pressure, pulse, and oxygen                        saturations were monitored continuously. The                        Colonoscope was introduced through the anus and                        advanced to the the cecum, identified by appendiceal                        orifice and ileocecal valve. The colonoscopy was                        performed without difficulty. The patient tolerated the                        procedure well. The quality of the bowel preparation                        was excellent. Findings:      A few small-mouthed diverticula were found in the sigmoid colon.      Internal hemorrhoids were found during endoscopy. The hemorrhoids were       small and Grade I (internal hemorrhoids that do not prolapse).      The exam was otherwise without abnormality. Impression:           - Diverticulosis in the sigmoid colon.                       -  Internal hemorrhoids.                       - The examination was otherwise  normal.                       - No specimens collected. Recommendation:       - The findings and recommendations were discussed with                        the patient's family. Scot Jun, MD 09/14/2018 8:49:51 AM This report has been signed electronically. Number of Addenda: 0 Note Initiated On: 09/14/2018 8:00 AM Scope Withdrawal Time: 0 hours 7 minutes 15 seconds  Total Procedure Duration: 0 hours 12 minutes 19 seconds       [facility]

## 2018-09-14 NOTE — Transfer of Care (Signed)
Immediate Anesthesia Transfer of Care Note  Patient: Connor Tyler  Procedure(s) Performed: COLONOSCOPY WITH PROPOFOL (N/A ) ESOPHAGOGASTRODUODENOSCOPY (EGD) WITH PROPOFOL (N/A )  Patient Location: PACU  Anesthesia Type:General  Level of Consciousness: awake, alert  and oriented  Airway & Oxygen Therapy: Patient Spontanous Breathing and Patient connected to nasal cannula oxygen  Post-op Assessment: Report given to RN and Post -op Vital signs reviewed and stable  Post vital signs: Reviewed and stable  Last Vitals:  Vitals Value Taken Time  BP 101/60 09/14/2018  8:51 AM  Temp 36.2 C 09/14/2018  8:40 AM  Pulse 65 09/14/2018  8:51 AM  Resp 15 09/14/2018  8:51 AM  SpO2 96 % 09/14/2018  8:51 AM  Vitals shown include unvalidated device data.  Last Pain:  Vitals:   09/14/18 0840  TempSrc: Tympanic  PainSc:          Complications: No apparent anesthesia complications

## 2018-09-14 NOTE — H&P (Signed)
Primary Care Physician:  Lynnea Ferrier, MD Primary Gastroenterologist:  Dr. Mechele Collin  Pre-Procedure History & Physical: HPI:  Connor Tyler is a 73 y.o. male is here for an endoscopy and colonoscopy.   Past Medical History:  Diagnosis Date  . Allergic rhinitis   . Anemia   . Asthma   . Coronary artery disease 11/11   Diffuse mild 3 vessell CAD by cardiac cath @ the [facility]  . ED (erectile dysfunction)   . GERD (gastroesophageal reflux disease)   . Heart murmur   . History of MRSA infection   . Hypertension   . Mitral regurgitation   . Valvular heart disease     Past Surgical History:  Procedure Laterality Date  . ANAL FISSURE REPAIR    . CARDIAC CATHETERIZATION     [facility]  . CATARACT EXTRACTION W/PHACO Left 01/25/2018   Procedure: CATARACT EXTRACTION PHACO AND INTRAOCULAR LENS PLACEMENT (IOC);  Surgeon: Galen Manila, MD;  Location: [facility] ORS;  Service: Ophthalmology;  Laterality: Left;  [place] 00:49 AP% 13.3 CDE 6.60 Fluid pack lot # [id number]  . COLONOSCOPY    . TONSILLECTOMY    . wisdom teeth resected      Prior to Admission medications   Medication Sig Start Date End Date Taking? Authorizing Provider  amLODipine (NORVASC) 5 MG tablet Take 5 mg by mouth daily.  07/05/15  Yes [provider]  bisoprolol (ZEBETA) 10 MG tablet TAKE 1 TABLET EVERY DAY 07/05/15  Yes [provider]  Cholecalciferol (D3 ADULT PO) Take 1 tablet by mouth daily.   Yes [provider]  Coenzyme Q10 10 MG capsule Take 30 mg by mouth 3 (three) times daily.   Yes [provider]  lisinopril (PRINIVIL,ZESTRIL) 40 MG tablet TAKE 1 TABLET ONE TIME DAILY 07/05/15  Yes [provider]  lovastatin (MEVACOR) 40 MG tablet TAKE 1 TABLET EVERY NIGHT 07/05/15  Yes [provider]  phenylephrine-shark liver oil-mineral oil-petrolatum (PREPARATION H) 0.25-3-14-71.9 % rectal ointment Place 1 application rectally 2 (two) times  daily as needed for hemorrhoids.   Yes [provider]  albuterol (PROAIR HFA) 108 (90 BASE) MCG/ACT inhaler Inhale 2 puffs into the lungs every 4 (four) hours as needed for wheezing or shortness of breath.     [provider]  aspirin EC 81 MG tablet Take 81 mg by mouth daily.     [provider]  azelastine (ASTELIN) 0.1 % nasal spray Place into both nostrils 2 (two) times daily. Use in each nostril as directed    [provider]  cetirizine (ZYRTEC) 10 MG tablet Take 10 mg by mouth daily.     [provider]  hydrocortisone (ANUSOL-HC) 25 MG suppository Place 25 mg rectally at bedtime as needed for hemorrhoids or anal itching.    [provider]  Melatonin 5 MG CAPS Take 5 mg by mouth at bedtime as needed (for sleep).    [provider]  mometasone (ASMANEX 60 METERED DOSES) 220 MCG/INH inhaler Inhale 1 puff into the lungs 2 (two) times daily.  03/25/15   [provider]  omeprazole (PRILOSEC) 20 MG capsule Take 20 mg by mouth daily.    [provider]  vitamin B-12 (CYANOCOBALAMIN) 1000 MCG tablet Take 1,000 mcg by mouth daily.    [provider]  vitamin C (ASCORBIC ACID) 500 MG tablet Take 500 mg by mouth daily.    [provider]    Allergies as of 06/23/2018  . (  No Known Allergies)    Family History  Problem Relation Age of Onset  . Stroke Mother   . Dementia Mother   . AAA (abdominal aortic aneurysm) Father   . Heart attack Father     Social History   Socioeconomic History  . Marital status: Married    Spouse name: Not on file  . Number of children: Not on file  . Years of education: Not on file  . Highest education level: Not on file  Occupational History  . Not on file  Social Needs  . Financial resource strain: Not on file  . Food insecurity:    Worry: Not on file    Inability: Not on file  . Transportation needs:    Medical: Not on file    Non-medical: Not on file   Tobacco Use  . Smoking status: Former Smoker    Years: 13.00    Types: Cigarettes  . Smokeless tobacco: Never Used  Substance and Sexual Activity  . Alcohol use: Yes    Comment: occassionally   . Drug use: No  . Sexual activity: Not on file  Lifestyle  . Physical activity:    Days per week: Not on file    Minutes per session: Not on file  . Stress: Not on file  Relationships  . Social connections:    Talks on phone: Not on file    Gets together: Not on file    Attends religious service: Not on file    Active member of club or organization: Not on file    Attends meetings of clubs or organizations: Not on file    Relationship status: Not on file  . Intimate partner violence:    Fear of current or ex partner: Not on file    Emotionally abused: Not on file    Physically abused: Not on file    Forced sexual activity: Not on file  Other Topics Concern  . Not on file  Social History Narrative  . Not on file    Review of Systems: See HPI, otherwise negative ROS  Physical Exam: BP (!) 163/83   Pulse 66   Temp (!) 97.5 F (36.4 C) (Tympanic)   Resp 20   Ht 5\' 7"  (1.702 m)   Wt 85.3 kg   SpO2 97%   BMI 29.44 kg/m  General:   Alert,  pleasant and cooperative in NAD Head:  Normocephalic and atraumatic. Neck:  Supple; no masses or thyromegaly. Lungs:  Clear throughout to auscultation.    Heart:  Regular rate and rhythm. Abdomen:  Soft, nontender and nondistended. Normal bowel sounds, without guarding, and without rebound.   Neurologic:  Alert and  oriented x4;  grossly normal neurologically.  Impression/Plan: Connor Tyler is here for an endoscopy and colonoscopy to be performed for GERD and rectal bleeding.  Risks, benefits, limitations, and alternatives regarding  endoscopy and colonoscopy have been reviewed with the patient.  Questions have been answered.  All parties agreeable.   Lynnae Prude, MD  09/14/2018, 8:18 AM

## 2018-09-14 NOTE — Anesthesia Preprocedure Evaluation (Signed)
Anesthesia Evaluation  Patient identified by MRN, date of birth, ID band Patient awake    Reviewed: Allergy & Precautions, NPO status , Patient's Chart, lab work & pertinent test results  History of Anesthesia Complications Negative for: history of anesthetic complications  Airway Mallampati: II  TM Distance: >3 FB Neck ROM: Full    Dental no notable dental hx.    Pulmonary asthma , former smoker,    breath sounds clear to auscultation- rhonchi (-) wheezing      Cardiovascular hypertension, + CAD (nonocclusive)  + Valvular Problems/Murmurs MR  Rhythm:Regular Rate:Normal - Systolic murmurs and - Diastolic murmurs Echo 06/30/18: - Left ventricle: The cavity size was normal. Systolic function was   normal. The estimated ejection fraction was in the range of 55%   to 60%. Wall motion was normal; there were no regional wall   motion abnormalities. Doppler parameters are consistent with   abnormal left ventricular relaxation (grade 1 diastolic   dysfunction). - Aortic valve: There was mild regurgitation. - Mitral valve: There was mild regurgitation. - Left atrium: The atrium was mildly dilated. - Right ventricle: Systolic function was normal. - Pulmonary arteries: Systolic pressure was within the normal   range.   Neuro/Psych neg Seizures negative neurological ROS  negative psych ROS   GI/Hepatic Neg liver ROS, GERD  ,  Endo/Other  negative endocrine ROSneg diabetes  Renal/GU negative Renal ROS     Musculoskeletal negative musculoskeletal ROS (+)   Abdominal (+) - obese,   Peds  Hematology  (+) anemia ,   Anesthesia Other Findings Past Medical History: No date: Allergic rhinitis No date: Anemia No date: Asthma 11/11: Coronary artery disease     Comment:  Diffuse mild 3 vessell CAD by cardiac cath @ the               [facility] No date: ED (erectile dysfunction) No date: GERD (gastroesophageal reflux  disease) No date: Heart murmur No date: History of MRSA infection No date: Hypertension No date: Mitral regurgitation No date: Valvular heart disease   Reproductive/Obstetrics                             Anesthesia Physical Anesthesia Plan  ASA: III  Anesthesia Plan: General   Post-op Pain Management:    Induction: Intravenous  PONV Risk Score and Plan: 1 and Propofol infusion  Airway Management Planned: Natural Airway  Additional Equipment:   Intra-op Plan:   Post-operative Plan:   Informed Consent: I have reviewed the patients History and Physical, chart, labs and discussed the procedure including the risks, benefits and alternatives for the proposed anesthesia with the patient or authorized representative who has indicated his/her understanding and acceptance.   Dental advisory given  Plan Discussed with: CRNA and Anesthesiologist  Anesthesia Plan Comments:         Anesthesia Quick Evaluation

## 2018-09-14 NOTE — Anesthesia Post-op Follow-up Note (Signed)
Anesthesia QCDR form completed.        

## 2018-09-15 ENCOUNTER — Encounter: Payer: Self-pay | Admitting: Unknown Physician Specialty

## 2018-09-15 LAB — SURGICAL PATHOLOGY

## 2019-01-02 DIAGNOSIS — M150 Primary generalized (osteo)arthritis: Secondary | ICD-10-CM | POA: Insufficient documentation

## 2019-01-16 ENCOUNTER — Other Ambulatory Visit
Admission: RE | Admit: 2019-01-16 | Discharge: 2019-01-16 | Disposition: A | Discharge status: Home / Self Care | Payer: Medicare Other | Source: Ambulatory Visit | Attending: Internal Medicine | Admitting: Internal Medicine

## 2019-01-16 ENCOUNTER — Other Ambulatory Visit: Payer: Self-pay

## 2019-01-16 ENCOUNTER — Ambulatory Visit
Admission: RE | Admit: 2019-01-16 | Discharge: 2019-01-16 | Disposition: A | Discharge status: Home / Self Care | Payer: Medicare Other | Source: Ambulatory Visit | Attending: Internal Medicine | Admitting: Internal Medicine

## 2019-01-16 ENCOUNTER — Other Ambulatory Visit: Payer: Self-pay | Admitting: Internal Medicine

## 2019-01-16 ENCOUNTER — Encounter: Payer: Self-pay | Admitting: Cardiovascular Disease

## 2019-01-16 ENCOUNTER — Encounter: Payer: Self-pay | Admitting: Physician Assistant

## 2019-01-16 DIAGNOSIS — R7989 Other specified abnormal findings of blood chemistry: Secondary | ICD-10-CM | POA: Insufficient documentation

## 2019-01-16 DIAGNOSIS — I2510 Atherosclerotic heart disease of native coronary artery without angina pectoris: Secondary | ICD-10-CM | POA: Diagnosis present

## 2019-01-16 DIAGNOSIS — R079 Chest pain, unspecified: Secondary | ICD-10-CM | POA: Diagnosis not present

## 2019-01-16 DIAGNOSIS — R0600 Dyspnea, unspecified: Secondary | ICD-10-CM | POA: Diagnosis present

## 2019-01-16 DIAGNOSIS — R002 Palpitations: Secondary | ICD-10-CM | POA: Insufficient documentation

## 2019-01-16 LAB — TROPONIN I: Troponin I: <0.03 ng/mL (ref <0.03)

## 2019-01-16 LAB — FIBRIN DERIVATIVES D-DIMER (ARMC ONLY): Fibrin derivatives D-dimer (ARMC): 590.99 ng/mL (FEU) — ABNORMAL HIGH (ref 0.00–499.00)

## 2019-01-16 MED ORDER — IOHEXOL 350 MG/ML SOLN
75.0000 mL | Freq: Once | INTRAVENOUS | Status: AC | PRN
  Administered 2019-01-16: 75 mL via INTRAVENOUS

## 2019-01-16 NOTE — Telephone Encounter (Signed)
This encounter was created in error - please disregard.

## 2019-01-17 ENCOUNTER — Telehealth: Payer: Self-pay

## 2019-01-17 NOTE — Telephone Encounter (Signed)
Pending

## 2019-01-18 ENCOUNTER — Inpatient Hospital Stay: Admission: RE | Admit: 2019-01-18 | Payer: Medicare Other | Source: Ambulatory Visit

## 2019-01-18 ENCOUNTER — Telehealth (INDEPENDENT_AMBULATORY_CARE_PROVIDER_SITE_OTHER): Payer: Medicare Other | Admitting: Physician Assistant

## 2019-01-18 ENCOUNTER — Other Ambulatory Visit: Payer: Self-pay

## 2019-01-18 ENCOUNTER — Telehealth: Payer: Self-pay

## 2019-01-18 VITALS — BP 152/88 | HR 61 | Ht 67.5 in | Wt 199.0 lb

## 2019-01-18 DIAGNOSIS — E785 Hyperlipidemia, unspecified: Secondary | ICD-10-CM

## 2019-01-18 DIAGNOSIS — R079 Chest pain, unspecified: Secondary | ICD-10-CM

## 2019-01-18 DIAGNOSIS — R0602 Shortness of breath: Secondary | ICD-10-CM

## 2019-01-18 DIAGNOSIS — Z01810 Encounter for preprocedural cardiovascular examination: Secondary | ICD-10-CM

## 2019-01-18 DIAGNOSIS — I340 Nonrheumatic mitral (valve) insufficiency: Secondary | ICD-10-CM

## 2019-01-18 DIAGNOSIS — I10 Essential (primary) hypertension: Secondary | ICD-10-CM

## 2019-01-18 DIAGNOSIS — R002 Palpitations: Secondary | ICD-10-CM

## 2019-01-18 MED ORDER — AMLODIPINE BESYLATE 5 MG PO TABS: 5.0000 mg | ORAL_TABLET | Freq: Every day | ORAL | 3 refills | Status: DC

## 2019-01-18 NOTE — Telephone Encounter (Signed)
Call to patient, reviewed new information obtained that no covid testing is needed for myoview per management.   Pt verbalized understanding.   Advised pt to call for any further questions or concerns.

## 2019-01-18 NOTE — Progress Notes (Signed)
Order changed per CV testing staff request

## 2019-01-18 NOTE — Addendum Note (Signed)
Addended by: Efrain Sella on: 01/18/2019 10:56 AM   Modules accepted: Orders

## 2019-01-18 NOTE — Addendum Note (Signed)
Addended by: Efrain Sella on: 01/18/2019 10:52 AM   Modules accepted: Orders

## 2019-01-18 NOTE — Progress Notes (Signed)
Virtual Visit via Video Note   This visit type was conducted due to national recommendations for restrictions regarding the COVID-19 Pandemic (e.g. social distancing) in an effort to limit this patient's exposure and mitigate transmission in our community.  Due to his co-morbid illnesses, this patient is at least at moderate risk for complications without adequate follow up.  This format is felt to be most appropriate for this patient at this time.  All issues noted in this document were discussed and addressed.  A limited physical exam was performed with this format.  Please refer to the patient's chart for his consent to telehealth for [facility].   Date:  01/18/2019   ID:  Connor Tyler, DOB February 09, 1946, MRN [id number]  Patient Location: Home Provider Location: Home  PCP:  Lynnea Ferrier, MD  Cardiologist:  Lorine Bears, MD  Electrophysiologist:  None   Evaluation Performed:  Follow-Up Visit  Chief Complaint:  Fatigue, chest tightness, DOE, palpitations  History of Present Illness:    Connor Tyler is a 73 y.o. male with history of mild nonobstructive 3-vessel CAD, moderate mitral regurgitation due to mitral valve prolapse, HTN, HLD, anemia, and asthma who presents for evaluation of fatigue, chest tightness, DOE, and palpitations.   In 2011, he was evaluated at the [facility] and it was felt his mitral regurgitation was moderate and not severe leading to continued medical management. Cardiac cath at that time showed mild nonobstructive CAD. Stress echo in 2016 showed no evidence of ischemia with the patient exercising for 5 minutes and 41 seconds. Prior echo from 2017 showed an EF of 50-55%, mild AI, mild MR with pulmonary hypertension with an estimated PASP of 43 mmHg.   He was last see in our office in 06/2018 for routine follow up and was doing well. Echo performed at that time to trend with mitral valve disease showed an EF of 55-60%, normal wall motion,  Gr1DD, mild AI, mild MR, mildly dilated left atrium, RVSF normal, PASP normal.    He was seen by his PCP on 01/16/2019 with a 2 week history of fatigue, chest tightness, and dyspnea on exertion. He reported if he was able to be active he actually felt better. He also noted intermittent palpitations, though not associated with tachycardia. BP has been running in the 130-160s/70-80s with heart rates in the 50s-70s. Scanned in EKG from that visit is very difficult to interpret though does appear to show sinus bradycardia, 55 bpm, without any apparent st/t changes. CTA chest on 01/16/2019 showed no evidence of PE, mild cardiomegaly, bibasilar scarring, and aortic atherosclerosis. Labs showed a negative troponin, D dimer elevated at 590.99, WBC 4.7, HGB 14.6, PLT 205, K+ 4.2, SCr 0.9, LFT normal, TSH normal. Holter monitor was placed on patient by PCP at that time. We do not have those results.   At baseline, patient lives a very active lifestyle without significant limitation. Over the past 2 weeks he has noted increased fatigue, leg weakness at time, a "heart pounding sensation", chest tightness, and SOB. He reports the chest tightness will come on at random times and is not associated with activity. He does not SOB when he initially starts an activity, though the SOB improves as he continues on with the activity. For example, he walked 18 holes of golf last week and was SOB for the first two holes, then his SOB resolved and he was able to play the remaining 16 holes without issues. He most recently played 9  holes of golf with his wife on 01/17/2019, though rode a golf cart this time. Following this, while sitting watching TV that night he had an episode of "heart pounding" without any associated symptoms. At times, he does feel like his SOB is associated with this heart pounding. He did note he had increased his caffeine intake leading up to the above onset of symptoms, though has since decreased this and has noted  an improvement in his palpitations, though not resolved. He denies any dizziness, presyncope, syncope, lower extremity swelling, abdominal fullness, orthopnea, PND, or early satiety. He is currently symptom free and has 18 holes of golf scheduled for this afternoon.   The patient does not have symptoms concerning for COVID-19 infection (fever, chills, cough, or new shortness of breath).    Past Medical History:  Diagnosis Date  . Allergic rhinitis   . Anemia   . Asthma   . Coronary artery disease 11/11   Diffuse mild 3 vessell CAD [facility]  . ED (erectile dysfunction)   . GERD (gastroesophageal reflux disease)   . Heart murmur   . History of MRSA infection   . Hypertension   . Mitral regurgitation   . Valvular heart disease    Past Surgical History:  Procedure Laterality Date  . ANAL FISSURE REPAIR    . [facility]     [facility]  . CATARACT EXTRACTION W/PHACO Left 01/25/2018   Procedure: CATARACT EXTRACTION PHACO AND INTRAOCULAR LENS PLACEMENT (IOC);  Surgeon: Galen ManilaPorfilio, William, MD;  Location: [facility] ORS;  Service: Ophthalmology;  Laterality: Left;  US 00:49 AP% 13.3 CDE 6.60 Fluid pack lot # [id number]  . COLONOSCOPY    . COLONOSCOPY WITH PROPOFOL N/A 09/14/2018   Procedure: COLONOSCOPY WITH PROPOFOL;  Surgeon: Scot JunElliott, Robert T, MD;  [facility];  Service: Endoscopy;  Laterality: N/A;  . ESOPHAGOGASTRODUODENOSCOPY (EGD) WITH PROPOFOL N/A 09/14/2018   Procedure: ESOPHAGOGASTRODUODENOSCOPY (EGD) WITH PROPOFOL;  Surgeon: Scot JunElliott, Robert T, MD;  [facility];  Service: Endoscopy;  Laterality: N/A;  IV ZOSYN  . TONSILLECTOMY    . wisdom teeth resected       Current Meds  Medication Sig  . albuterol (PROAIR HFA) 108 (90 BASE) MCG/ACT inhaler Inhale 2 puffs into the lungs every 4 (four) hours as needed for wheezing or shortness of breath.   Marland Kitchen. amLODipine (NORVASC) 5 MG tablet Take 5 mg by mouth daily.   Marland Kitchen. aspirin EC  81 MG tablet Take 81 mg by mouth daily.   . bisoprolol (ZEBETA) 10 MG tablet TAKE 1 TABLET EVERY DAY  . cetirizine (ZYRTEC) 10 MG tablet Take 10 mg by mouth daily.   . Cholecalciferol (D3 ADULT PO) Take 1 tablet by mouth daily.  . Coenzyme Q10 10 MG capsule Take 10 mg by mouth daily.   . hydrocortisone (ANUSOL-HC) 25 MG suppository Place 25 mg rectally at bedtime as needed for hemorrhoids or anal itching.  Marland Kitchen. lisinopril (PRINIVIL,ZESTRIL) 40 MG tablet TAKE 1 TABLET ONE TIME DAILY  . lovastatin (MEVACOR) 40 MG tablet TAKE 1 TABLET EVERY NIGHT  . Melatonin 5 MG CAPS Take 10 mg by mouth at bedtime.   . mometasone (ASMANEX 60 METERED DOSES) 220 MCG/INH inhaler Inhale 1 puff into the lungs daily.   Marland Kitchen. omeprazole (PRILOSEC) 20 MG capsule Take 20 mg by mouth daily.  . phenylephrine-shark liver oil-mineral oil-petrolatum (PREPARATION H) 0.25-3-14-71.9 % rectal ointment Place 1 application rectally 2 (two) times daily as needed for hemorrhoids.  .Marland Kitchen  Probiotic Product (PROBIOTIC DAILY PO) Take 4 capsules by mouth daily. Biocomplete 3  . vitamin B-12 (CYANOCOBALAMIN) 1000 MCG tablet Take 1,000 mcg by mouth daily.  . vitamin C (ASCORBIC ACID) 500 MG tablet Take 500 mg by mouth daily.     Allergies:   Patient has no known allergies.   Social History   Tobacco Use  . Smoking status: Former Smoker    Years: 13.00    Types: Cigarettes  . Smokeless tobacco: Never Used  Substance Use Topics  . Alcohol use: Yes    Comment: occassionally   . Drug use: No     Family Hx: The patient's family history includes AAA (abdominal aortic aneurysm) in his father; Dementia in his mother; Heart attack in his father; Stroke in his mother.  ROS:   Please see the history of present illness.     All other systems reviewed and are negative.   Prior CV studies:   The following studies were reviewed today:  2D Echo 06/2018: - Left ventricle: The cavity size was normal. Systolic function was normal. The estimated  ejection fraction was in the range of 55% to 60%. Wall motion was normal; there were no regional wall motion abnormalities. Doppler parameters are consistent with abnormal left ventricular relaxation (grade 1 diastolic dysfunction). - Aortic valve: There was mild regurgitation. - Mitral valve: There was mild regurgitation. - Left atrium: The atrium was mildly dilated. - Right ventricle: Systolic function was normal. - Pulmonary arteries: Systolic pressure was within the normal   range.  Labs/Other Tests and Data Reviewed:    EKG:  An ECG dated 01/16/2019 was personally reviewed today and demonstrated:  sinus bradycardia, 55 bpm, no acute st/t changes (scanned copy difficult to interpret)  Recent Labs: No results found for requested labs within last 8760 hours.   Recent Lipid Panel No results found for: CHOL, TRIG, HDL, CHOLHDL, LDLCALC, LDLDIRECT  Wt Readings from Last 3 Encounters:  01/18/19 199 lb (90.3 kg)  09/14/18 188 lb (85.3 kg)  06/17/18 197 lb (89.4 kg)     Objective:    Vital Signs:  BP (!) 152/88 (BP Location: Left Arm, Patient Position: Sitting) Comment: taken 01/17/2019  Pulse 61   Ht 5' 7.5" (1.715 m)   Wt 199 lb (90.3 kg)   BMI 30.71 kg/m    VITAL SIGNS:  reviewed GEN:  no acute distress EYES:  sclerae anicteric, EOMI - Extraocular Movements Intact  ASSESSMENT & PLAN:    1. Chest pain with moderate risk for cardiac etiology/dyspnea: Symptoms are overall atypical in that they get better with exertion. CTA chest negative for PE, though does appear to be significant coronary artery calcium on images by my read. Currently chest pain and SOB free. Schedule Lexiscan Myoview to evaluate for high-risk ischemia. Recent echo as above. Continue ASA, bisoprolol, lisinopril, and lovastatin. Aggressive risk factor modification. I have asked him to postpone his golf until his stress test is completed and reviewed.   2. Palpitations: Denies any sensation of tachycardia,  dizziness, presyncope, or syncope. "Just a heart pounding" sensation. He is wearing a Holter ordered by PCP and is due to turn this in today. I request the results of this be sent to [place] for review once PCP has reviewed. He did note the monitor came off several times while playing 9 holes of golf on 5/12. If the monitor is nondiagnostic, we may have to repeat with a Zio patch. Recent electrolytes and thyroid function were unrevealing. Palpitations have  improved, though are not resolved following decrease of caffeine (he reported he had increased consumption of caffeine leading up to his onset of symptoms). Continue bisoprolol.    3. Mitral regurgitation: Mild by echo in 06/2018. Continue to monitor with periodic echo.   4. HTN: Blood pressure has been elevated lately into the 150s to 160s systolic. Increase amlodipine to 5 mg daily (previously taking 2.5 mg daily). Continue current doses of bisoprolol and lisinopril. Low sodium diet and minimize caffeine as above.   5. HLD: LDL of 73 from 06/2018. Remains on lovastatin. If he is found to have significant coronary atherosclerosis, consider changing to high-intensity statin.   COVID-19 Education: The signs and symptoms of COVID-19 were discussed with the patient and how to seek care for testing (follow up with PCP or arrange E-visit).  The importance of social distancing was discussed today.  Time:   Today, I have spent 21 minutes with the patient with telehealth technology discussing the above problems.     Medication Adjustments/Labs and Tests Ordered: Current medicines are reviewed at length with the patient today.  Concerns regarding medicines are outlined above.   Tests Ordered: No orders of the defined types were placed in this encounter.   Medication Changes: No orders of the defined types were placed in this encounter.   Disposition:  Follow up in 1 month(s)  Signed, Eula Listen, PA-C  01/18/2019 8:59 AM    [facility]

## 2019-01-18 NOTE — Telephone Encounter (Signed)
Called pt to review preprocedure guidelines for lexiscan/myoview scheduled for 01/20/19.   Order placed for CV19 at [facility].   Reviewed instructions for testing. Pt verbalized understanding.   Written instructions provided in mychart/AVS.   Pt denies any fevers/cough/illness over the past 14 days. Has been in isolation with his wife for the past month. He denies being in contact with anyone testing positive for CV 19.  Advised pt to call for any further questions or concerns.

## 2019-01-18 NOTE — Patient Instructions (Addendum)
It was a pleasure to speak with you on the phone today! Thank you for allowing [place] to continue taking care of your [facility] needs during this time.   Feel free to call as needed for questions and concerns related to your cardiac needs.   Medication Instructions:  Your physician has recommended you make the following change in your medication:  1- INCREASE Amlodipine to 1 tablet (5 mg total) once daily.  If you need a refill on your cardiac medications before your next appointment, please call your pharmacy.   Lab work: None ordered   If you have labs (blood work) drawn today and your tests are completely normal, you will receive your results only by: Marland Kitchen MyChart Message (if you have MyChart) OR . A paper copy in the mail If you have any lab test that is abnormal or we need to change your treatment, we will call you to review the results.  Testing/Procedures: 1- [facility] MYOVIEW  Your caregiver has ordered a Stress Test with nuclear imaging. The purpose of this test is to evaluate the blood supply to your heart muscle. This procedure is referred to as a "Non-Invasive Stress Test." This is because other than having an IV started in your vein, nothing is inserted or "invades" your body. Cardiac stress tests are done to find areas of poor blood flow to the heart by determining the extent of coronary artery disease (CAD). Some patients exercise on a treadmill, which naturally increases the blood flow to your heart, while others who are  unable to walk on a treadmill due to physical limitations have a pharmacologic/chemical stress agent called Lexiscan . This medicine will mimic walking on a treadmill by temporarily increasing your coronary blood flow.   Please note: these test may take anywhere between 2-4 hours to complete  PLEASE REPORT TO [facility] MEDICAL MALL ENTRANCE  THE VOLUNTEERS AT THE FIRST DESK WILL DIRECT YOU WHERE TO GO  Date of Procedure:______5/15/20_________________________  Arrival  Time for Procedure:____7 am______________________  Instructions regarding medication:   __X__:  Hold betablocker(s) night before procedure and morning of procedure (bisoprolol)  PLEASE NOTIFY THE OFFICE AT LEAST 24 HOURS IN ADVANCE IF YOU ARE UNABLE TO KEEP YOUR APPOINTMENT.  [phone] AND  PLEASE NOTIFY NUCLEAR MEDICINE AT [facility] AT LEAST 24 HOURS IN ADVANCE IF YOU ARE UNABLE TO KEEP YOUR APPOINTMENT. [phone]  How to prepare for your Myoview test:  1. Do not eat or drink after midnight 2. No caffeine for 24 hours prior to test 3. No smoking 24 hours prior to test. 4. Your medication may be taken with water.  If your doctor stopped a medication because of this test, do not take that medication. 5. Ladies, please do not wear dresses.  Skirts or pants are appropriate. Please wear a short sleeve shirt. 6. No perfume, cologne or lotion. 7. Wear comfortable walking shoes. No heels!   Follow-Up: At [facility], you and your health needs are our priority.  As part of our continuing mission to provide you with exceptional heart care, we have created designated Provider Care Teams.  These Care Teams include your primary Cardiologist (physician) and Advanced Practice Providers (APPs -  Physician Assistants and Nurse Practitioners) who all work together to provide you with the care you need, when you need it. You will need a follow up appointment in 1 months. You may see Lorine Bears, MD or Eula Listen, PA-C.

## 2019-01-18 NOTE — Telephone Encounter (Signed)
Virtual Visit Pre-Appointment Phone Call  "Connor Tyler, I am calling you today to discuss your upcoming appointment. We are currently trying to limit exposure to the virus that causes COVID-19 by seeing patients at home rather than in the office."  1. "What is the BEST phone number to call the day of the visit?" - include this in appointment notes  2. "Do you have or have access to (through a family member/friend) a smartphone with video capability that we can use for your visit?" a. If yes - list this number in appt notes as "cell" (if different from BEST phone #) and list the appointment type as a VIDEO visit in appointment notes b. If no - list the appointment type as a PHONE visit in appointment notes  3. Confirm consent - "In the setting of the current Covid19 crisis, you are scheduled for a phone visit with your provider on Jan 18, 2019  at 9:00.  Just as we do with many in-office visits, in order for you to participate in this visit, we must obtain consent.  If you'd like, I can send this to your mychart (if signed up) or email for you to review.  Otherwise, I can obtain your verbal consent now.  All virtual visits are billed to your insurance company just like a normal visit would be.  By agreeing to a virtual visit, we'd like you to understand that the technology does not allow for your provider to perform an examination, and thus may limit your provider's ability to fully assess your condition. If your provider identifies any concerns that need to be evaluated in person, we will make arrangements to do so.  Finally, though the technology is pretty good, we cannot assure that it will always work on either your or our end, and in the setting of a video visit, we may have to convert it to a phone-only visit.  In either situation, we cannot ensure that we have a secure connection.  Are you willing to proceed?" STAFF: Did the patient verbally acknowledge consent to telehealth visit? Document YES/NO  here: YES  4. Advise patient to be prepared - "Two hours prior to your appointment, go ahead and check your blood pressure, pulse, oxygen saturation, and your weight (if you have the equipment to check those) and write them all down. When your visit starts, your provider will ask you for this information. If you have an Apple Watch or Kardia device, please plan to have heart rate information ready on the day of your appointment. Please have a pen and paper handy nearby the day of the visit as well."  5. Give patient instructions for MyChart download to smartphone OR Doximity/Doxy.me as below if video visit (depending on what platform provider is using)  6. Inform patient they will receive a phone call 15 minutes prior to their appointment time (may be from unknown caller ID) so they should be prepared to answer    TELEPHONE CALL NOTE  Connor Tyler has been deemed a candidate for a follow-up tele-health visit to limit community exposure during the Covid-19 pandemic. I spoke with the patient via phone to ensure availability of phone/video source, confirm preferred email & phone number, and discuss instructions and expectations.  I reminded Connor Tyler to be prepared with any vital sign and/or heart rhythm information that could potentially be obtained via home monitoring, at the time of his visit. I reminded Connor Tyler to expect a phone call prior  to his visit.  [name] 01/18/2019 8:55 AM   INSTRUCTIONS FOR DOWNLOADING THE MYCHART APP TO SMARTPHONE  - The patient must first make sure to have activated MyChart and know their login information - If Apple, go to [employer] and type in MyChart in the search bar and download the app. If Android, ask patient to go to [employer] and type in Jag[place] in the search bar and download the app. The app is free but as with any other app downloads, their phone may require them to verify saved payment information or  Apple/Android password.  - The patient will need to then log into the app with their MyChart username and password, and select [facility] as their healthcare provider to link the account. When it is time for your visit, go to the MyChart app, find appointments, and click Begin Video Visit. Be sure to Select Allow for your device to access the Microphone and Camera for your visit. You will then be connected, and your provider will be with you shortly.  **If they have any issues connecting, or need assistance please contact MyChart service desk [phone])**  **If using a computer, in order to ensure the best quality for their visit they will need to use either of the following Internet Browsers: [employer] or Google Chrome**  IF USING DOXIMITY or DOXY.ME - The patient will receive a link just prior to their visit by text.     FULL LENGTH CONSENT FOR TELE-HEALTH VISIT   I hereby voluntarily request, consent and authorize [facility] and its employed or contracted physicians, physician assistants, nurse practitioners or other licensed health care professionals (the Practitioner), to provide me with telemedicine health care services (the "Services") as deemed necessary by the treating Practitioner. I acknowledge and consent to receive the Services by the Practitioner via telemedicine. I understand that the telemedicine visit will involve communicating with the Practitioner through live audiovisual communication technology and the disclosure of certain medical information by electronic transmission. I acknowledge that I have been given the opportunity to request an in-person assessment or other available alternative prior to the telemedicine visit and am voluntarily participating in the telemedicine visit.  I understand that I have the right to withhold or withdraw my consent to the use of telemedicine in the course of my care at any time, without affecting my right to future care  or treatment, and that the Practitioner or I may terminate the telemedicine visit at any time. I understand that I have the right to inspect all information obtained and/or recorded in the course of the telemedicine visit and may receive copies of available information for a reasonable fee.  I understand that some of the potential risks of receiving the Services via telemedicine include:  Marland Kitchen Delay or interruption in medical evaluation due to technological equipment failure or disruption; . Information transmitted may not be sufficient (e.g. poor resolution of images) to allow for appropriate medical decision making by the Practitioner; and/or  . In rare instances, security protocols could fail, causing a breach of personal health information.  Furthermore, I acknowledge that it is my responsibility to provide information about my medical history, conditions and care that is complete and accurate to the best of my ability. I acknowledge that Practitioner's advice, recommendations, and/or decision may be based on factors not within their control, such as incomplete or inaccurate data provided by me or distortions of diagnostic images or specimens that may result from electronic  transmissions. I understand that the practice of medicine is not an exact science and that Practitioner makes no warranties or guarantees regarding treatment outcomes. I acknowledge that I will receive a copy of this consent concurrently upon execution via email to the email address I last provided but may also request a printed copy by calling the office of [facility].    I understand that my insurance will be billed for this visit.   I have read or had this consent read to me. . I understand the contents of this consent, which adequately explains the benefits and risks of the Services being provided via telemedicine.  . I have been provided ample opportunity to ask questions regarding this consent and the Services and have had  my questions answered to my satisfaction. . I give my informed consent for the services to be provided through the use of telemedicine in my medical care  By participating in this telemedicine visit I agree to the above.

## 2019-01-18 NOTE — Progress Notes (Signed)
Per management, no CV 19 testing needed for lexiscan.   Order cancelled.

## 2019-01-18 NOTE — Addendum Note (Signed)
Addended by: Efrain Sella on: 01/18/2019 11:34 AM   Modules accepted: Orders

## 2019-01-20 ENCOUNTER — Encounter
Admission: RE | Admit: 2019-01-20 | Discharge: 2019-01-20 | Disposition: A | Discharge status: Home / Self Care | Payer: Medicare Other | Source: Ambulatory Visit | Attending: Physician Assistant | Admitting: Physician Assistant

## 2019-01-20 ENCOUNTER — Other Ambulatory Visit: Payer: Self-pay

## 2019-01-20 DIAGNOSIS — R079 Chest pain, unspecified: Secondary | ICD-10-CM | POA: Insufficient documentation

## 2019-01-20 LAB — NM MYOCAR MULTI W/SPECT W/WALL MOTION / EF
Estimated workload: 1 METS
Exercise duration (min): 0 min
Exercise duration (sec): 0 s
LV dias vol: 144 mL (ref 62–150)
LV sys vol: 63 mL
MPHR: 149 {beats}/min
Peak HR: 86 {beats}/min
Percent HR: 57 %
Rest HR: 57 {beats}/min
SDS: 4
SRS: 6
SSS: 10
TID: 1.05

## 2019-01-20 MED ORDER — REGADENOSON 0.4 MG/5ML IV SOLN
0.4000 mg | Freq: Once | INTRAVENOUS | Status: AC
  Administered 2019-01-20: 09:00:00 0.4 mg via INTRAVENOUS

## 2019-01-20 MED ORDER — TECHNETIUM TC 99M TETROFOSMIN IV KIT
10.5810 | PACK | Freq: Once | INTRAVENOUS | Status: AC | PRN
  Administered 2019-01-20: 08:00:00 10.581 via INTRAVENOUS

## 2019-01-20 MED ORDER — TECHNETIUM TC 99M TETROFOSMIN IV KIT
30.0000 | PACK | Freq: Once | INTRAVENOUS | Status: AC | PRN
  Administered 2019-01-20: 09:00:00 30.62 via INTRAVENOUS

## 2019-02-16 NOTE — Progress Notes (Signed)
Virtual Visit via Telephone Note   This visit type was conducted due to national recommendations for restrictions regarding the COVID-19 Pandemic (e.g. social distancing) in an effort to limit this patient's exposure and mitigate transmission in our community.  Due to his co-morbid illnesses, this patient is at least at moderate risk for complications without adequate follow up.  This format is felt to be most appropriate for this patient at this time.  The patient did not have access to video technology/had technical difficulties with video requiring transitioning to audio format only (telephone).  All issues noted in this document were discussed and addressed.  No physical exam could be performed with this format.  Please refer to the patient's chart for his  consent to telehealth for [facility].   Date:  02/21/2019   ID:  Connor ChessStephen K Goodloe, DOB 23-Sep-1945, MRN [id number]  Patient Location: Home Provider Location: Office  PCP:  Lynnea FerrierKlein, Bert J III, MD  Cardiologist:  Lorine BearsMuhammad Arida, MD  Electrophysiologist:  None   Evaluation Performed:  Follow-Up Visit  Chief Complaint:  Follow up Myoview   History of Present Illness:    Connor Tyler is a 73 y.o. male with history of mild nonobstructive 3-vessel CAD, moderate mitral regurgitation due to mitral valve prolapse, HTN, HLD, anemia, and asthma who presents for follow up of recent Myoview in 01/2019.    In 2011, [facility] and it was felt his mitral regurgitation was moderate and not severe leading to continued medical management. Cardiac cath at that time showed mild nonobstructive CAD. Stress echo in 2016 showed no evidence of ischemia with the patient exercising for 5 minutes and 41 seconds. Prior echo from 2017 showed an EF of 50-55%, mild AI, mild MR with pulmonary hypertension with an estimated PASP of 43 mmHg.   He was seen in our office in 06/2018 for routine follow up and was doing well. Echo  performed at that time to trend with mitral valve disease showed an EF of 55-60%, normal wall motion, Gr1DD, mild AI, mild MR, mildly dilated left atrium, RVSF normal, PASP normal.    He was seen by his PCP on 01/16/2019 with a 2 week history of fatigue, chest tightness, and dyspnea on exertion. He reported if he was able to be active he actually felt better. He also noted intermittent palpitations, though not associated with tachycardia. BP has been running in the 130-160s/70-80s with heart rates in the 50s-70s. Scanned in EKG from that visit was very difficult to interpret though did appear to show sinus bradycardia, 55 bpm, without any apparent st/t changes. CTA chest on 01/16/2019 showed no evidence of PE, mild cardiomegaly, bibasilar scarring, and aortic atherosclerosis. Labs showed a negative troponin, D dimer elevated at 590.99, WBC 4.7, HGB 14.6, PLT 205, K+ 4.2, SCr 0.9, LFT normal, TSH normal. Holter monitor was placed on patient by PCP at that time with results not on file for our review. He was seen in telemedicine follow up by cardiology in 01/2019 and noted increased fatigue, chest tightness, and SOB. His symptoms seemed to improve as his activity level continued. He underwent Myoview on 01/20/2019 that was low risk with an EF of 55-65%.   Patient is seen in telehealth follow-up this morning and is doing well from a cardiac perspective.  He has not had any further chest discomfort and his energy level has returned back to baseline.  He does report being called by PCP office indicating he  had a short run of a fast heart rate though does not know details.  He continues to note intermittent brief episodes where he feels like he needs to take an isolated deep breath.  These episodes are randomly occurring and quite sporadic.  Blood pressure has been running in the 140s to 150s mostly with an occasional reading in the 761Y systolic.  He is tolerating escalation of amlodipine without issue.  He has played  multiple rounds of golf since we last visited without issue.   The patient does not have symptoms concerning for COVID-19 infection (fever, chills, cough, or new shortness of breath).    Past Medical History:  Diagnosis Date   Allergic rhinitis    Anemia    Asthma    Coronary artery disease 11/11   Diffuse mild 3 vessell CAD by cardiac cath @ the [facility]   ED (erectile dysfunction)    GERD (gastroesophageal reflux disease)    Heart murmur    History of MRSA infection    Hypertension    Mitral regurgitation    Valvular heart disease    Past Surgical History:  Procedure Laterality Date   ANAL [facility]   CATARACT EXTRACTION W/PHACO Left 01/25/2018   Procedure: CATARACT EXTRACTION PHACO AND INTRAOCULAR LENS PLACEMENT ([place]);  Surgeon: Birder Robson, MD;  Location: [facility] ORS;  Service: Ophthalmology;  Laterality: Left;  [place] 00:49 AP% 13.3 CDE 6.60 Fluid pack lot # [id number]   COLONOSCOPY     COLONOSCOPY WITH PROPOFOL N/A 09/14/2018   Procedure: COLONOSCOPY WITH PROPOFOL;  Surgeon: Manya Silvas, MD;  Location: [facility] ENDOSCOPY;  Service: Endoscopy;  Laterality: N/A;   ESOPHAGOGASTRODUODENOSCOPY (EGD) WITH PROPOFOL N/A 09/14/2018   Procedure: ESOPHAGOGASTRODUODENOSCOPY (EGD) WITH PROPOFOL;  Surgeon: Manya Silvas, MD;  Location: [facility] ENDOSCOPY;  Service: Endoscopy;  Laterality: N/A;  IV ZOSYN   TONSILLECTOMY     wisdom teeth resected       Current Meds  Medication Sig   albuterol (PROAIR HFA) 108 (90 BASE) MCG/ACT inhaler Inhale 2 puffs into the lungs every 4 (four) hours as needed for wheezing or shortness of breath.    amLODipine (NORVASC) 5 MG tablet Take 1 tablet (5 mg total) by mouth daily.   aspirin EC 81 MG tablet Take 81 mg by mouth daily.    bisoprolol (ZEBETA) 10 MG tablet TAKE 1 TABLET EVERY DAY   cetirizine (ZYRTEC) 10 MG tablet Take 10 mg by mouth daily.    Cholecalciferol  (D3 ADULT PO) Take 1 tablet by mouth daily.   Coenzyme Q10 10 MG capsule Take 10 mg by mouth daily.    hydrocortisone (ANUSOL-HC) 25 MG suppository Place 25 mg rectally at bedtime as needed for hemorrhoids or anal itching.   lisinopril (PRINIVIL,ZESTRIL) 40 MG tablet TAKE 1 TABLET ONE TIME DAILY   lovastatin (MEVACOR) 40 MG tablet TAKE 1 TABLET EVERY NIGHT   Melatonin 5 MG CAPS Take 10 mg by mouth at bedtime.    mometasone (ASMANEX 60 METERED DOSES) 220 MCG/INH inhaler Inhale 1 puff into the lungs daily.    omeprazole (PRILOSEC) 20 MG capsule Take 20 mg by mouth daily.   phenylephrine-shark liver oil-mineral oil-petrolatum (PREPARATION H) 0.25-3-14-71.9 % rectal ointment Place 1 application rectally 2 (two) times daily as needed for hemorrhoids.     Allergies:   Patient has no known allergies.   Social History   Tobacco Use   Smoking status: Former  Smoker    Years: 13.00    Types: Cigarettes   Smokeless tobacco: Never Used  Substance Use Topics   Alcohol use: Yes    Comment: occassionally    Drug use: No     Family Hx: The patient's family history includes AAA (abdominal aortic aneurysm) in his father; Dementia in his mother; Heart attack in his father; Stroke in his mother.  ROS:   Please see the history of present illness.     All other systems reviewed and are negative.   Prior CV studies:   The following studies were reviewed today:  2D Echo 06/2018: - Left ventricle: The cavity size was normal. Systolic function wasnormal. The estimated ejection fraction was in the range of 55%to 60%. Wall motion was normal; there were no regional wallmotion abnormalities. Doppler parameters are consistent withabnormal left ventricular relaxation (grade 1 diastolicdysfunction). - Aortic valve: There was mild regurgitation. - Mitral valve: There was mild regurgitation. - Left atrium: The atrium was mildly dilated. - Right ventricle: Systolic function was normal. -  Pulmonary arteries: Systolic pressure was within the normal range. __________  Myoview 01/2019:  The study is normal.  This is a low risk study.  The left ventricular ejection fraction is normal (55-65%).  Labs/Other Tests and Data Reviewed:    EKG:  An ECG dated 01/16/2019 was personally reviewed today and demonstrated:  sinus bradycardia, 55 bpm, without any apparent st/t changes  Recent Labs: No results found for requested labs within last 8760 hours.   Recent Lipid Panel No results found for: CHOL, TRIG, HDL, CHOLHDL, LDLCALC, LDLDIRECT  Wt Readings from Last 3 Encounters:  02/21/19 190 lb (86.2 kg)  01/18/19 199 lb (90.3 kg)  09/14/18 188 lb (85.3 kg)     Objective:    Vital Signs:  BP (!) 166/89 (BP Location: Left Arm, Patient Position: Sitting, Cuff Size: Normal)    Pulse (!) 57    Ht 5\' 8"  (1.727 m)    Wt 190 lb (86.2 kg)    BMI 28.89 kg/m    VITAL SIGNS:  reviewed Recheck blood pressure 152 systolic  ASSESSMENT & PLAN:    1. Coronary artery disease involving the native coronary arteries without chest pain: Recent episode of chest pain resolved.  Recent CTA chest negative for PE.  Recent Lexiscan Myoview low risk.  Continue current medications including aspirin, bisoprolol, lisinopril, and lovastatin with aggressive risk factor modification.  2. Dyspnea: Patient notes sporadic intermittent episodes where he feels like he needs to take an isolated deep breath and.  These are not associated with activity/exertion.  Referral to pulmonology for PFT and obtain echocardiogram.  Recent ischemic evaluation low risk as outlined above.  Recent CBC demonstrating hemoglobin of 14.6.  3. Palpitations: Much improved to resolved.  Contact PCP office to obtain recent Holter monitor.  Continue bisoprolol.  4. Mitral regurgitation: Mild by echo in 06/2018.  Check echo given continued intermittent dyspnea.  5. Hypertension: Blood pressure is suboptimally controlled.  I did discuss  with him escalation of amlodipine though he prefers to remain on current medication doses.  Following repeat echo as above we may need to revisit escalation of antihypertensive therapy.  6. Hyperlipidemia: LDL of 73 from 06/2018.  Continue statin.  Followed by PCP.  COVID-19 Education: The signs and symptoms of COVID-19 were discussed with the patient and how to seek care for testing (follow up with PCP or arrange E-visit).  The importance of social distancing was discussed today.  Time:  Today, I have spent 10 minutes with the patient with telehealth technology discussing the above problems.     Medication Adjustments/Labs and Tests Ordered: Current medicines are reviewed at length with the patient today.  Concerns regarding medicines are outlined above.   Tests Ordered: No orders of the defined types were placed in this encounter.   Medication Changes: No orders of the defined types were placed in this encounter.   Disposition:  Follow up in 3 month(s)  Signed, Eula Listen, PA-C  02/21/2019 10:27 AM    [facility]

## 2019-02-21 ENCOUNTER — Other Ambulatory Visit: Payer: Self-pay

## 2019-02-21 ENCOUNTER — Encounter: Payer: Self-pay | Admitting: Physician Assistant

## 2019-02-21 ENCOUNTER — Telehealth (INDEPENDENT_AMBULATORY_CARE_PROVIDER_SITE_OTHER): Payer: Medicare Other | Admitting: Physician Assistant

## 2019-02-21 VITALS — BP 166/89 | HR 57 | Ht 68.0 in | Wt 190.0 lb

## 2019-02-21 DIAGNOSIS — I340 Nonrheumatic mitral (valve) insufficiency: Secondary | ICD-10-CM

## 2019-02-21 DIAGNOSIS — R002 Palpitations: Secondary | ICD-10-CM | POA: Diagnosis not present

## 2019-02-21 DIAGNOSIS — I2510 Atherosclerotic heart disease of native coronary artery without angina pectoris: Secondary | ICD-10-CM | POA: Diagnosis not present

## 2019-02-21 DIAGNOSIS — I10 Essential (primary) hypertension: Secondary | ICD-10-CM

## 2019-02-21 DIAGNOSIS — R0600 Dyspnea, unspecified: Secondary | ICD-10-CM

## 2019-02-21 DIAGNOSIS — E785 Hyperlipidemia, unspecified: Secondary | ICD-10-CM

## 2019-02-21 NOTE — Patient Instructions (Signed)
It was a pleasure to speak with you on the phone today! Thank you for allowing [place] to continue taking care of your [facility] needs during this time.   Feel free to call as needed for questions and concerns related to your cardiac needs.   Medication Instructions:  Your physician recommends that you continue on your current medications as directed. Please refer to the Current Medication list given to you today.  If you need a refill on your cardiac medications before your next appointment, please call your pharmacy.   Lab work: None ordered  If you have labs (blood work) drawn today and your tests are completely normal, you will receive your results only by: Marland Kitchen MyChart Message (if you have MyChart) OR . A paper copy in the mail If you have any lab test that is abnormal or we need to change your treatment, we will call you to review the results.  Testing/Procedures: 1- Echo  Please return to [facility] on ______________ at _______________ AM/PM for an Echocardiogram. Your physician has requested that you have an echocardiogram. Echocardiography is a painless test that uses sound waves to create images of your heart. It provides your doctor with information about the size and shape of your heart and how well your heart's chambers and valves are working. This procedure takes approximately one hour. There are no restrictions for this procedure. Please note; depending on visual quality an IV may need to be placed.    Follow-Up: At [facility], you and your health needs are our priority.  As part of our continuing mission to provide you with exceptional heart care, we have created designated Provider Care Teams.  These Care Teams include your primary Cardiologist (physician) and Advanced Practice Providers (APPs -  Physician Assistants and Nurse Practitioners) who all work together to provide you with the care you need, when you need it. You will need a follow up appointment in 3  months.  You may see Kathlyn Sacramento, MD or Christell Faith, PA-C.   Any Other Special Instructions Will Be Listed Below (If Applicable). 1- Referral to pulmonology

## 2019-02-21 NOTE — Progress Notes (Signed)
Holter monitor from PCP received and reviewed. Monitor showed a predominant rhythm of sinus with an average heart rate of 65 bpm with a range of 49-157 bpm. The patient was noted to be bradycardic 30.1% of the time while wearing the heart monitor with the longest bradycardic episode lasting 22 minutes. There were 3,470 PVCs representing a 3.9% burden, ventricular bigeminy and trigeminy were noted with a rare ventricular couplet. 70 PACs representing a < 1% burden. There were 2 runs of SVT with the fastest interval lasting 13 beats and the longest interval lasting 18 beats. No Afib/flutter was noted.   Recent Myoview low risk with preserved EF.  He is scheduled for repeat echo.  With the above noted ectopy and elevated BP, following his echo results we may titrate bisoprolol to 15 or 20 mg daily.

## 2019-02-22 ENCOUNTER — Telehealth: Payer: Self-pay | Admitting: Internal Medicine

## 2019-02-22 NOTE — Telephone Encounter (Signed)
Called patient for COVID-19 pre-screening for in office visit. ° °Have you recently traveled any where out of the local area in the last 2 weeks? No ° °Have you been in close contact with a person diagnosed with COVID-19 within the last 2 weeks? No °Do you currently have any of the following symptoms? If so, when did they start? °Cough     Diarrhea   Joint Pain °Fever      Muscle Pain   Red eyes °Shortness of breath   Abdominal pain  Vomiting °Loss of smell    Rash    Sore Throat °Headache    Weakness   Bruising or bleeding ° °Okay to proceed with visit 02/24/2019 ° °

## 2019-02-23 NOTE — Progress Notes (Signed)
[facility]      Assessment and Plan:  Asthma, suspect allergic in etiology. Dyspnea on exertion, variable. Allergic rhinitis -Patient has good exercise tolerance on most days, but sometimes can have dyspnea on mild exertion. - We will maximize his asthma therapy, he is currently on Asmanex which is not covered by his insurance, will switch to Flovent 220, 2 puffs daily, rinse mouth after use. - Continue Zyrtec, added Rhinocort nasal spray. - If not improved after 1 month he is asked to call us back, and we can consider sending him for a lung function test and other testing.  Mitral valve prolapse. - Currently following with [facility], and [facility].  Meds ordered this encounter  Medications  . fluticasone (FLOVENT HFA) 220 MCG/ACT inhaler    Sig: Inhale 2 puffs into the lungs daily.    Dispense:  1 Inhaler    Refill:  2   Return if symptoms worsen or fail to improve.    Date: 02/24/2019  MRN# [id number] Connor Tyler 07-03-1946    Connor Tyler is a 73 y.o. old male seen in consultation for chief complaint of:    Chief Complaint  Patient presents with  . Consult    Referred by Eula Listenyan Dunn for dyspnea, Slowly sneaks up on him, walked 18 holes of gold yesterday was okay, sometimes sitting has to remind himself to take deep breaths, has an echo next week    HPI:   He notes that he has been having slowly progressive dyspnea over the past 6-12 months. He played 18 holes of golf yesterday pushing the cart and did ok with that. Other times he will be sitting in his chair and when he does that he might be short winded. Other times when he is sitting he might have to take a deep breath when he is sitting.  He uses asmanex inhaler one puff at night before bedtime, he has albuterol which he uses less then once per month. He was originally on asmanex 1 puff twice daily, then cut back to once daily. He tried  stopping it completely about 3 yrs ago, but then he got central chest pressure. He restarted one puff once nightly and the pain went away.  He was diagnosed with asthma about 15 yrs ago, he has had a flare up when he has a cold.  He has no pets.  He has not been tested for allergies, he take zyrtec all year round. If he stops it he gets runny nose. He still gets runny nose in the am.  He sleeps on his stomach because of continual drainage.  His wife says that he snores at night, no witnessed apneas, he is sleepy during the day, he takes a 15 min power nap in the afternoon, and then he is good, has done that most of his life.  He has not been tested for OSA.  He has reflux, controlled with omeprazole.  He has not smoked since 1972. His dad smoked.  He works in Research officer, [employer]. No occupational exposures.   **CT chest 01/16/2019>> imaging personally reviewed, lungs are unremarkable, cardiomegaly. **Echo 06/30/2018>> EF equals 55%, systolic pressure reported as within normal range. **No recent labs.  PMHX:   Past Medical History:  Diagnosis Date  . Allergic rhinitis   . Anemia   . Asthma   . Coronary artery disease 11/11   Diffuse mild 3 vessell CAD by cardiac cath @  [facility]  . ED (erectile dysfunction)   . GERD (gastroesophageal reflux disease)   . Heart murmur   . History of MRSA infection   . Hypertension   . Mitral regurgitation   . Valvular heart disease    Surgical Hx:  Past Surgical History:  Procedure Laterality Date  . ANAL FISSURE REPAIR    . CARDIAC CATHETERIZATION     [facility]  . CATARACT EXTRACTION W/PHACO Left 01/25/2018   Procedure: CATARACT EXTRACTION PHACO AND INTRAOCULAR LENS PLACEMENT (IOC);  Surgeon: Galen Manila, MD;  Location: [facility] ORS;  Service: Ophthalmology;  Laterality: Left;  [place] 00:49 AP% 13.3 CDE 6.60 Fluid pack lot # [id number]  . COLONOSCOPY    . COLONOSCOPY WITH PROPOFOL N/A 09/14/2018   Procedure: COLONOSCOPY WITH PROPOFOL;   Surgeon: Scot Jun, MD;  Location: [facility] ENDOSCOPY;  Service: Endoscopy;  Laterality: N/A;  . ESOPHAGOGASTRODUODENOSCOPY (EGD) WITH PROPOFOL N/A 09/14/2018   Procedure: ESOPHAGOGASTRODUODENOSCOPY (EGD) WITH PROPOFOL;  Surgeon: Scot Jun, MD;  Location: [facility] ENDOSCOPY;  Service: Endoscopy;  Laterality: N/A;  IV ZOSYN  . TONSILLECTOMY    . wisdom teeth resected     Family Hx:  Family History  Problem Relation Age of Onset  . Stroke Mother   . Dementia Mother   . AAA (abdominal aortic aneurysm) Father   . Heart attack Father    Social Hx:   Social History   Tobacco Use  . Smoking status: Former Smoker    Packs/day: 1.00    Years: 13.00    Pack years: 13.00    Types: Cigarettes    Quit date: 1972    Years since quitting: 48.4  . Smokeless tobacco: Never Used  Substance Use Topics  . Alcohol use: Yes    Comment: occassionally   . Drug use: No   Medication:    Current Outpatient Medications:  .  albuterol (PROAIR HFA) 108 (90 BASE) MCG/ACT inhaler, Inhale 2 puffs into the lungs every 4 (four) hours as needed for wheezing or shortness of breath. , Disp: , Rfl:  .  amLODipine (NORVASC) 5 MG tablet, Take 1 tablet (5 mg total) by mouth daily., Disp: 90 tablet, Rfl: 3 .  aspirin EC 81 MG tablet, Take 81 mg by mouth daily. , Disp: , Rfl:  .  bisoprolol (ZEBETA) 10 MG tablet, TAKE 1 TABLET EVERY DAY, Disp: , Rfl:  .  cetirizine (ZYRTEC) 10 MG tablet, Take 10 mg by mouth daily. , Disp: , Rfl:  .  Coenzyme Q10 10 MG capsule, Take 10 mg by mouth daily. , Disp: , Rfl:  .  hydrocortisone (ANUSOL-HC) 25 MG suppository, Place 25 mg rectally at bedtime as needed for hemorrhoids or anal itching., Disp: , Rfl:  .  lisinopril (PRINIVIL,ZESTRIL) 40 MG tablet, TAKE 1 TABLET ONE TIME DAILY, Disp: , Rfl:  .  lovastatin (MEVACOR) 40 MG tablet, TAKE 1 TABLET EVERY NIGHT, Disp: , Rfl:  .  Melatonin 5 MG CAPS, Take 10 mg by mouth at bedtime. , Disp: , Rfl:  .  mometasone (ASMANEX 60 METERED  DOSES) 220 MCG/INH inhaler, Inhale 1 puff into the lungs daily. , Disp: , Rfl:  .  omeprazole (PRILOSEC) 20 MG capsule, Take 20 mg by mouth daily., Disp: , Rfl:  .  vitamin B-12 (CYANOCOBALAMIN) 500 MCG tablet, Take 500 mcg by mouth daily., Disp: , Rfl:  .  vitamin C (ASCORBIC ACID) 500 MG tablet, Take 500 mg by mouth daily., Disp: , Rfl:  .  Cholecalciferol (D3 ADULT PO), Take 1 tablet by mouth daily., Disp: , Rfl:  .  phenylephrine-shark liver oil-mineral oil-petrolatum (PREPARATION H) 0.25-3-14-71.9 % rectal ointment, Place 1 application rectally 2 (two) times daily as needed for hemorrhoids., Disp: , Rfl:    Allergies:  Patient has no known allergies.  Review of Systems: Gen:  Denies  fever, sweats, chills HEENT: Denies blurred vision, double vision. bleeds, sore throat Cvc:  No dizziness, chest pain. Resp:   Denies cough or sputum production, shortness of breath Gi: Denies swallowing difficulty, stomach pain. Gu:  Denies bladder incontinence, burning urine Ext:   No Joint pain, stiffness. Skin: No skin rash,  hives  Endoc:  No polyuria, polydipsia. Psych: No depression, insomnia. Other:  All other systems were reviewed with the patient and were negative other that what is mentioned in the HPI.   Physical Examination:   VS: BP 140/78 (BP Location: Left Arm, Cuff Size: Normal)   Pulse (!) 57   Temp 98.6 F (37 C) (Temporal)   Ht 5\' 8"  (1.727 m)   Wt 199 lb 12.8 oz (90.6 kg)   SpO2 95%   BMI 30.38 kg/m   General Appearance: No distress  Neuro:without focal findings,  speech normal,  HEENT: PERRLA, EOM intact.   Pulmonary: normal breath sounds, No wheezing.  CardiovascularNormal S1,S2.  No m/r/g.   Abdomen: Benign, Soft, non-tender. Renal:  No costovertebral tenderness  GU:  No performed at this time. Endoc: No evident thyromegaly, no signs of acromegaly. Skin:   warm, no rashes, no ecchymosis  Extremities: normal, no cyanosis, clubbing.  Other findings:     LABORATORY PANEL:   CBC No results for input(s): WBC, HGB, HCT, PLT in the last 168 hours. ------------------------------------------------------------------------------------------------------------------  Chemistries  No results for input(s): NA, K, CL, CO2, GLUCOSE, BUN, CREATININE, CALCIUM, MG, AST, ALT, ALKPHOS, BILITOT in the last 168 hours.  Invalid input(s): GFRCGP ------------------------------------------------------------------------------------------------------------------  Cardiac Enzymes No results for input(s): TROPONINI in the last 168 hours. ------------------------------------------------------------  RADIOLOGY:  No results found.     Thank  you for the consultation and for allowing [place] [facility] to assist in the care of your patient. Our recommendations are noted above.  Please contact us if we can be of further service.   Marda Stalker, M.D., F.C.C.P.  Board Certified in Internal Medicine, Pulmonary Medicine, [address], and Sleep Medicine.  [place] [facility] Office Number: [phone]   02/24/2019

## 2019-02-24 ENCOUNTER — Other Ambulatory Visit: Payer: Self-pay

## 2019-02-24 ENCOUNTER — Encounter: Payer: Self-pay | Admitting: Internal Medicine

## 2019-02-24 ENCOUNTER — Ambulatory Visit (INDEPENDENT_AMBULATORY_CARE_PROVIDER_SITE_OTHER): Payer: Medicare Other | Admitting: Internal Medicine

## 2019-02-24 VITALS — BP 140/78 | HR 57 | Temp 98.6°F | Ht 68.0 in | Wt 199.8 lb

## 2019-02-24 DIAGNOSIS — I2510 Atherosclerotic heart disease of native coronary artery without angina pectoris: Secondary | ICD-10-CM | POA: Diagnosis not present

## 2019-02-24 DIAGNOSIS — J4540 Moderate persistent asthma, uncomplicated: Secondary | ICD-10-CM

## 2019-02-24 MED ORDER — FLUTICASONE PROPIONATE HFA 220 MCG/ACT IN AERO: 2.0000 | INHALATION_SPRAY | Freq: Every day | RESPIRATORY_TRACT | 2 refills | Status: DC

## 2019-02-24 NOTE — Patient Instructions (Addendum)
Change asmanex to Flovent inhaler, 2 puffs every morning. Rinse mouth after use.  Start rhinocort (or Nasacort) over-the-counter one spray in each nostril once daily.  Continue Zyrtec.  Call us back in 1 month with progress.

## 2019-03-21 ENCOUNTER — Telehealth: Payer: Self-pay

## 2019-03-21 NOTE — Telephone Encounter (Signed)

## 2019-03-22 ENCOUNTER — Other Ambulatory Visit: Payer: Self-pay

## 2019-03-22 ENCOUNTER — Ambulatory Visit (INDEPENDENT_AMBULATORY_CARE_PROVIDER_SITE_OTHER): Payer: Medicare Other

## 2019-03-22 DIAGNOSIS — I340 Nonrheumatic mitral (valve) insufficiency: Secondary | ICD-10-CM | POA: Diagnosis not present

## 2019-03-24 ENCOUNTER — Telehealth: Payer: Self-pay | Admitting: Physician Assistant

## 2019-03-24 NOTE — Telephone Encounter (Signed)
I spoke with the patient regarding his echo results.  He is aware of Christell Faith, PA's recommendations that he could take a trial of lasix 20 mg once daily x 7 days to see if this improves his SOB.  The patient states he is ok right now and would prefer not to take lasix. He is agreeable with seeing Dr. Fletcher Anon in 2 months. Appt scheduled for 05/11/19 at 4:20 pm.  He is advised to call back prior to that with any worsening SOB. The patient is agreeable and voices understanding.

## 2019-03-24 NOTE — Telephone Encounter (Signed)
Notes recorded by Rise Mu, PA-C on 03/22/2019 at 5:06 PM EDT  Echo showed normal pump function, slightly stiffened heart, mildly elevated pressures along the right side of the heart, mildly to moderately dilated left atrium, mild to moderately leaky aortic valve, mildly leaky mitral valve.   When compared to echo from 2019 pump function remains normal as does the slightly stiffened heart and mildly leaky mitral valve. His aortic valve is slightly more leaky and can be monitored and follow-up. The pressures along the right side of the heart were slightly elevated which may be playing a role in his shortness of breath. If he would like, we can have him undergo a trial of Lasix 20 mg daily for 7 days and see how his breathing response. I would recommend a follow-up BMET after he has been on Lasix for 7 days to ensure stable renal function and potassium. I would like for the patient to follow-up with Dr. Fletcher Anon in 2 months to reassess his symptoms.

## 2019-04-02 NOTE — Progress Notes (Signed)
[place] [facility] Consultation    Virtual Visit via Video Note I connected with patient on 04/03/19 at  9:30 AM EDT by video and verified that I am speaking with the correct person using two identifiers.   I discussed the limitations, risks of performing an evaluation and management service by video and the availability of in person appointments. I also discussed with the patient that there may be a patient responsible charge related to this service.  In light of current covid-19 pandemic, patient also understands that we are trying to protect them by minimizing in office contact if at all possible.  The patient expressed understanding and agreed to proceed. Please see note below for further detail.    The patient was advised to call back or seek an in-person evaluation if the symptoms worsen or if the condition fails to improve as anticipated.   Connor Hobby, MD   Assessment and Plan:  Asthma, suspect allergic in etiology. Dyspnea on exertion. Allergic rhinitis. -He has increased his exercise, and his exercise tolerance has improved significantly.  Recommended to continue his daily walks. - Continue inhaled steroid, Flovent. - Continue Zyrtec, added Rhinocort nasal spray for chronic rhinitis.  Mitral valve prolapse. - Currently following with [facility], and is followed in the past with [facility].  No orders of the defined types were placed in this encounter.  No follow-ups on file.    Date: 04/02/2019  MRN# [id number] Connor Tyler 1946-09-04    Connor Tyler is a 73 y.o. old male seen in consultation for chief complaint of: dyspnea.      HPI:  Connor Tyler is a 73 y.o. male with a history of MVP. At last visit he was noted to have slowly progressive dyspnea over previous one year, and also variable as some days he could be very active and do fine, other days he would get winded with minimal activity. He had also noted that he had  been having central chest pressure. His asmanex was not covered by his insurance so he was prescribed flovent and asked to use it 2 puffs twice daily. He was having a lot of nasal drainage therefore asked that he take nasacort in addition to zyrtec.   He feels that he is doing well, his breathing has been doing well. He has been walking more, he is walking 3 to 5 miles per day, and tolerates it fairly well over 1.5 hours.  He decided to finish the asmanex so he has not yet started the flonase.  He has never been tested for OSA, he has mild snoring.  He continues zyrtex daily, he takes a nasal spray daily.  He has reflux, controlled with omeprazole.   He has not been tested for allergies, he take zyrtec all year round. If he stops it he gets runny nose. He still gets runny nose in the am.  He sleeps on his stomach because of continual drainage.  His wife says that he snores at night, no witnessed apneas, he is sleepy during the day, he takes a 15 min power nap in the afternoon, and then he is good, has done that most of his life.  He has not been tested for OSA.   He has not smoked since 1972. His dad smoked.  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   **CT chest 01/16/2019>> imaging personally reviewed, lungs are unremarkable, cardiomegaly. **Echo 06/30/2018>> EF equals 62%, systolic pressure reported as within normal range. **No  recent labs.  Medication:    Current Outpatient Medications:  .  albuterol (PROAIR HFA) 108 (90 BASE) MCG/ACT inhaler, Inhale 2 puffs into the lungs every 4 (four) hours as needed for wheezing or shortness of breath. , Disp: , Rfl:  .  amLODipine (NORVASC) 5 MG tablet, Take 1 tablet (5 mg total) by mouth daily., Disp: 90 tablet, Rfl: 3 .  aspirin EC 81 MG tablet, Take 81 mg by mouth daily. , Disp: , Rfl:  .  bisoprolol (ZEBETA) 10 MG tablet, TAKE 1 TABLET EVERY DAY, Disp: , Rfl:  .  cetirizine (ZYRTEC) 10 MG tablet, Take 10 mg by mouth daily. , Disp: ,  Rfl:  .  Cholecalciferol (D3 ADULT PO), Take 1 tablet by mouth daily., Disp: , Rfl:  .  Coenzyme Q10 10 MG capsule, Take 10 mg by mouth daily. , Disp: , Rfl:  .  fluticasone (FLOVENT HFA) 220 MCG/ACT inhaler, Inhale 2 puffs into the lungs daily., Disp: 1 Inhaler, Rfl: 2 .  hydrocortisone (ANUSOL-HC) 25 MG suppository, Place 25 mg rectally at bedtime as needed for hemorrhoids or anal itching., Disp: , Rfl:  .  lisinopril (PRINIVIL,ZESTRIL) 40 MG tablet, TAKE 1 TABLET ONE TIME DAILY, Disp: , Rfl:  .  lovastatin (MEVACOR) 40 MG tablet, TAKE 1 TABLET EVERY NIGHT, Disp: , Rfl:  .  Melatonin 5 MG CAPS, Take 10 mg by mouth at bedtime. , Disp: , Rfl:  .  mometasone (ASMANEX 60 METERED DOSES) 220 MCG/INH inhaler, Inhale 1 puff into the lungs daily. , Disp: , Rfl:  .  omeprazole (PRILOSEC) 20 MG capsule, Take 20 mg by mouth daily., Disp: , Rfl:  .  phenylephrine-shark liver oil-mineral oil-petrolatum (PREPARATION H) 0.25-3-14-71.9 % rectal ointment, Place 1 application rectally 2 (two) times daily as needed for hemorrhoids., Disp: , Rfl:  .  vitamin B-12 (CYANOCOBALAMIN) 500 MCG tablet, Take 500 mcg by mouth daily., Disp: , Rfl:  .  vitamin C (ASCORBIC ACID) 500 MG tablet, Take 500 mg by mouth daily., Disp: , Rfl:    Allergies:  Patient has no known allergies.  Review of Systems:  Constitutional: Feels well. Cardiovascular: Denies chest pain, exertional chest pain.  Pulmonary: Denies hemoptysis, pleuritic chest pain.   The remainder of systems were reviewed and were found to be negative other than what is documented in the HPI.        LABORATORY PANEL:   CBC No results for input(s): WBC, HGB, HCT, PLT in the last 168 hours. ------------------------------------------------------------------------------------------------------------------  Chemistries  No results for input(s): NA, K, CL, CO2, GLUCOSE, BUN, CREATININE, CALCIUM, MG, AST, ALT, ALKPHOS, BILITOT in the last 168 hours.  Invalid  input(s): GFRCGP ------------------------------------------------------------------------------------------------------------------  Cardiac Enzymes No results for input(s): TROPONINI in the last 168 hours. ------------------------------------------------------------  RADIOLOGY:  No results found.     Thank  you for the consultation and for allowing [facility] to assist in the care of your patient. Our recommendations are noted above.  Please contact us if we can be of further service.   Wells Guiles, M.D., F.C.C.P.  Board Certified in Internal Medicine, Pulmonary Medicine, Critical Care Medicine, and Sleep Medicine.  [place] [facility] Office Number: [phone]   04/02/2019

## 2019-04-03 ENCOUNTER — Ambulatory Visit (INDEPENDENT_AMBULATORY_CARE_PROVIDER_SITE_OTHER): Payer: Medicare Other | Admitting: Internal Medicine

## 2019-04-03 DIAGNOSIS — J4540 Moderate persistent asthma, uncomplicated: Secondary | ICD-10-CM

## 2019-04-03 DIAGNOSIS — I2510 Atherosclerotic heart disease of native coronary artery without angina pectoris: Secondary | ICD-10-CM | POA: Diagnosis not present

## 2019-04-03 NOTE — Patient Instructions (Signed)
Glad that your breathing is feeling better. Continue your inhaler. Continue nasal spray and Zyrtec. Continue to walking/exercise is the best thing you can do for your lung health.

## 2019-05-11 ENCOUNTER — Ambulatory Visit: Payer: Medicare Other | Admitting: Cardiovascular Disease

## 2019-05-12 ENCOUNTER — Encounter: Payer: Self-pay | Admitting: Cardiovascular Disease

## 2019-05-12 ENCOUNTER — Ambulatory Visit (INDEPENDENT_AMBULATORY_CARE_PROVIDER_SITE_OTHER): Payer: Medicare Other | Admitting: Cardiovascular Disease

## 2019-05-12 ENCOUNTER — Other Ambulatory Visit: Payer: Self-pay

## 2019-05-12 VITALS — BP 140/70 | HR 55 | Temp 98.4°F | Ht 68.0 in | Wt 200.5 lb

## 2019-05-12 DIAGNOSIS — I341 Nonrheumatic mitral (valve) prolapse: Secondary | ICD-10-CM | POA: Diagnosis not present

## 2019-05-12 DIAGNOSIS — I2510 Atherosclerotic heart disease of native coronary artery without angina pectoris: Secondary | ICD-10-CM

## 2019-05-12 DIAGNOSIS — E785 Hyperlipidemia, unspecified: Secondary | ICD-10-CM | POA: Diagnosis not present

## 2019-05-12 DIAGNOSIS — I493 Ventricular premature depolarization: Secondary | ICD-10-CM

## 2019-05-12 DIAGNOSIS — I10 Essential (primary) hypertension: Secondary | ICD-10-CM | POA: Diagnosis not present

## 2019-05-12 NOTE — Progress Notes (Signed)
Cardiology Office Note   Date:  05/12/2019   ID:  Connor Tyler, DOB 1946/04/12, MRN [id number]  PCP:  Lynnea Ferrier, MD  Cardiologist:   Lorine Bears, MD   Chief Complaint  Patient presents with  . other    2 month follow up. Meds reviewed by the pt. verbally. "doing well."       History of Present Illness: Connor Tyler is a 73 y.o. male who presents for a follow up visit regarding moderate regurgitation due to mitral valve prolapse.  He has prolonged history of mitral regurgitation and a heart murmur. He was evaluated in 2011 at the [facility] clinic. It was concluded that his mitral regurgitation was moderate and not severe and thus surgery was not needed. Cardiac catheterization showed mild three-vessel coronary artery disease at that time. Other medical problems include hypertension.  He was seen recently for palpitations, fatigue and shortness of breath.  He underwent a Lexiscan Myoview which showed no evidence of ischemia with normal ejection fraction.  Echocardiogram showed normal LV systolic function with only mild mitral regurgitation. Holter monitor done with his primary care physician showed PVCs and short runs of SVT.  He cut down on caffeine intake at that time with resolution of symptoms.  He is feeling well at the present time.   Past Medical History:  Diagnosis Date  . Allergic rhinitis   . Anemia   . Asthma   . Coronary artery disease 11/11   Diffuse mild 3 vessell CAD by cardiac cath @ the [facility]  . ED (erectile dysfunction)   . GERD (gastroesophageal reflux disease)   . Heart murmur   . History of MRSA infection   . Hypertension   . Mitral regurgitation   . Valvular heart disease     Past Surgical History:  Procedure Laterality Date  . ANAL FISSURE REPAIR    . CARDIAC CATHETERIZATION     [facility]  . CATARACT EXTRACTION W/PHACO Left 01/25/2018   Procedure: CATARACT EXTRACTION PHACO AND INTRAOCULAR LENS PLACEMENT  (IOC);  Surgeon: Galen Manila, MD;  Location: [facility] ORS;  Service: Ophthalmology;  Laterality: Left;  [place] 00:49 AP% 13.3 CDE 6.60 Fluid pack lot # [id number]  . COLONOSCOPY    . COLONOSCOPY WITH PROPOFOL N/A 09/14/2018   Procedure: COLONOSCOPY WITH PROPOFOL;  Surgeon: Scot Jun, MD;  Location: [facility] ENDOSCOPY;  Service: Endoscopy;  Laterality: N/A;  . ESOPHAGOGASTRODUODENOSCOPY (EGD) WITH PROPOFOL N/A 09/14/2018   Procedure: ESOPHAGOGASTRODUODENOSCOPY (EGD) WITH PROPOFOL;  Surgeon: Scot Jun, MD;  Location: [facility] ENDOSCOPY;  Service: Endoscopy;  Laterality: N/A;  IV ZOSYN  . TONSILLECTOMY    . wisdom teeth resected       Current Outpatient Medications  Medication Sig Dispense Refill  . albuterol (PROAIR HFA) 108 (90 BASE) MCG/ACT inhaler Inhale 2 puffs into the lungs every 4 (four) hours as needed for wheezing or shortness of breath.     Marland Kitchen amLODipine (NORVASC) 5 MG tablet Take 1 tablet (5 mg total) by mouth daily. 90 tablet 3  . aspirin EC 81 MG tablet Take 81 mg by mouth daily.     . bisoprolol (ZEBETA) 10 MG tablet TAKE 1 TABLET EVERY DAY    . cetirizine (ZYRTEC) 10 MG tablet Take 10 mg by mouth daily.     . Cholecalciferol (D3 ADULT PO) Take 1 tablet by mouth daily.    . Coenzyme Q10 10 MG capsule Take 10 mg by mouth daily.     Marland Kitchen  fluticasone (FLOVENT HFA) 220 MCG/ACT inhaler Inhale 2 puffs into the lungs daily. 1 Inhaler 2  . lisinopril (PRINIVIL,ZESTRIL) 40 MG tablet TAKE 1 TABLET ONE TIME DAILY    . lovastatin (MEVACOR) 40 MG tablet TAKE 1 TABLET EVERY NIGHT    . Melatonin 5 MG CAPS Take 10 mg by mouth at bedtime.     . mometasone (ASMANEX 60 METERED DOSES) 220 MCG/INH inhaler Inhale 1 puff into the lungs daily.     Marland Kitchen. omeprazole (PRILOSEC) 20 MG capsule Take 20 mg by mouth daily.    . phenylephrine-shark liver oil-mineral oil-petrolatum (PREPARATION H) 0.25-3-14-71.9 % rectal ointment Place 1 application rectally 2 (two) times daily as needed for hemorrhoids.    .  vitamin B-12 (CYANOCOBALAMIN) 500 MCG tablet Take 500 mcg by mouth daily.    . vitamin C (ASCORBIC ACID) 500 MG tablet Take 500 mg by mouth daily.     No current facility-administered medications for this visit.     Allergies:   Patient has no known allergies.    Social History:  The patient  reports that he quit smoking about 48 years ago. His smoking use included cigarettes. He has a 13.00 pack-year smoking history. He has never used smokeless tobacco. He reports current alcohol use. He reports that he does not use drugs.   Family History:  The patient's family history includes AAA (abdominal aortic aneurysm) in his father; Dementia in his mother; Heart attack in his father; Stroke in his mother.    ROS:  Please see the history of present illness.   Otherwise, review of systems are positive for none.   All other systems are reviewed and negative.    PHYSICAL EXAM: VS:  BP 140/70   Pulse (!) 55   Temp 98.4 F (36.9 C)   Ht 5\' 8"  (1.727 m)   Wt 200 lb 8 oz (90.9 kg)   BMI 30.49 kg/m  , BMI Body mass index is 30.49 kg/m. GEN: Well nourished, well developed, in no acute distress  HEENT: normal  Neck: no JVD, carotid bruits, or masses Cardiac: RRR; no  rubs, or gallops,no edema . There is 2/6 holosystolic murmur at the apex radiating to the axilla Respiratory:  clear to auscultation bilaterally, normal work of breathing GI: soft, nontender, nondistended, + BS MS: no deformity or atrophy  Skin: warm and dry, no rash Neuro:  Strength and sensation are intact Psych: euthymic mood, full affect   EKG:  EKG is ordered today. The ekg ordered today demonstrates  normal sinus rhythm with borderline LVH.   Recent Labs: No results found for requested labs within last 8760 hours.    Lipid Panel No results found for: CHOL, TRIG, HDL, CHOLHDL, VLDL, LDLCALC, LDLDIRECT    Wt Readings from Last 3 Encounters:  05/12/19 200 lb 8 oz (90.9 kg)  02/24/19 199 lb 12.8 oz (90.6 kg)   02/21/19 190 lb (86.2 kg)        ASSESSMENT AND PLAN:  1.  Mitral regurgitation due to mitral valve prolapse: This has been only mild on recent echocardiograms.  He is asymptomatic.  2. Coronary artery disease involving native coronary arteries without angina: Previous cardiac catheterization showed mild nonobstructive coronary artery disease.  Recent Lexiscan Myoview was normal.  3. Essential hypertension: Blood pressure is reasonably controlled.  4. Hyperlipidemia: Continue treatment with lovastatin.  Most recent lipid profile in September showed an LDL of 73.  5.  Recent palpitations due to frequent PVCs: Symptoms resolved after cutting  down on caffeine intake.  Disposition:   FU with me in 1 year  Signed,  Kathlyn Sacramento, MD  05/12/2019 8:26 AM    [place]

## 2019-05-12 NOTE — Patient Instructions (Signed)
Medication Instructions:  Your physician recommends that you continue on your current medications as directed. Please refer to the Current Medication list given to you today.  If you need a refill on your cardiac medications before your next appointment, please call your pharmacy.   Lab work: None ordered If you have labs (blood work) drawn today and your tests are completely normal, you will receive your results only by: . MyChart Message (if you have MyChart) OR . A paper copy in the mail If you have any lab test that is abnormal or we need to change your treatment, we will call you to review the results.  Testing/Procedures: None ordered  Follow-Up: At [facility], you and your health needs are our priority.  As part of our continuing mission to provide you with exceptional heart care, we have created designated Provider Care Teams.  These Care Teams include your primary Cardiologist (physician) and Advanced Practice Providers (APPs -  Physician Assistants and Nurse Practitioners) who all work together to provide you with the care you need, when you need it. You will need a follow up appointment in 12 months.  Please call our office 2 months in advance to schedule this appointment.  You may see Muhammad Arida, MD or one of the following Advanced Practice Providers on your designated Care Team:   Christopher Berge, NP Ryan Dunn, PA-C . Jacquelyn Visser, PA-C  Any Other Special Instructions Will Be Listed Below (If Applicable). N/A   

## 2019-09-26 ENCOUNTER — Ambulatory Visit: Payer: Medicare Other | Attending: Internal Medicine

## 2019-09-26 DIAGNOSIS — Z23 Encounter for immunization: Secondary | ICD-10-CM | POA: Insufficient documentation

## 2019-09-26 NOTE — Progress Notes (Signed)
   Covid-19 Vaccination Clinic  Name:  DRAYCEN LEICHTER    MRN: [id number] DOB: 05-Sep-1946  09/26/2019  Mr. Ruhland was observed post Covid-19 immunization for 15 minutes without incidence. He was provided with Vaccine Information Sheet and instruction to access the V-Safe system.   Mr. Chait was instructed to call 911 with any severe reactions post vaccine: Marland Kitchen Difficulty breathing  . Swelling of your face and throat  . A fast heartbeat  . A bad rash all over your body  . Dizziness and weakness    Immunizations Administered    Name Date Dose VIS Date Route   Pfizer COVID-19 Vaccine 09/26/2019  5:49 PM 0.3 mL 08/18/2019 Intramuscular   Manufacturer: [employer], [employer]   Lot: [id number]   NDC: [address]-8

## 2019-10-16 ENCOUNTER — Ambulatory Visit: Payer: Medicare Other | Attending: Internal Medicine

## 2019-10-16 ENCOUNTER — Ambulatory Visit: Payer: Medicare Other

## 2019-10-16 DIAGNOSIS — Z23 Encounter for immunization: Secondary | ICD-10-CM | POA: Insufficient documentation

## 2019-10-16 NOTE — Progress Notes (Signed)
   Covid-19 Vaccination Clinic  Name:  SPURGEON GANCARZ    MRN: [id number] DOB: 1945-10-09  10/16/2019  Mr. Retherford was observed post Covid-19 immunization for 15 minutes without incidence. He was provided with Vaccine Information Sheet and instruction to access the V-Safe system.   Mr. Doane was instructed to call 911 with any severe reactions post vaccine: Marland Kitchen Difficulty breathing  . Swelling of your face and throat  . A fast heartbeat  . A bad rash all over your body  . Dizziness and weakness    Immunizations Administered    Name Date Dose VIS Date Route   Pfizer COVID-19 Vaccine 10/16/2019 11:52 AM 0.3 mL 08/18/2019 Intramuscular   Manufacturer: [employer], [employer]   Lot: [id number]   NDC: [address]-9

## 2019-11-10 DIAGNOSIS — I219 Acute myocardial infarction, unspecified: Secondary | ICD-10-CM

## 2019-11-10 HISTORY — DX: Acute myocardial infarction, unspecified: I21.9

## 2019-11-13 ENCOUNTER — Encounter: Payer: Self-pay | Admitting: Physician Assistant

## 2019-11-13 ENCOUNTER — Other Ambulatory Visit: Payer: Self-pay

## 2019-11-13 ENCOUNTER — Ambulatory Visit (INDEPENDENT_AMBULATORY_CARE_PROVIDER_SITE_OTHER): Payer: Medicare Other | Admitting: Physician Assistant

## 2019-11-13 VITALS — BP 140/78 | HR 56 | Ht 68.0 in | Wt 202.0 lb

## 2019-11-13 DIAGNOSIS — I2510 Atherosclerotic heart disease of native coronary artery without angina pectoris: Secondary | ICD-10-CM

## 2019-11-13 DIAGNOSIS — E785 Hyperlipidemia, unspecified: Secondary | ICD-10-CM

## 2019-11-13 DIAGNOSIS — I340 Nonrheumatic mitral (valve) insufficiency: Secondary | ICD-10-CM | POA: Diagnosis not present

## 2019-11-13 DIAGNOSIS — I252 Old myocardial infarction: Secondary | ICD-10-CM | POA: Diagnosis not present

## 2019-11-13 DIAGNOSIS — I10 Essential (primary) hypertension: Secondary | ICD-10-CM

## 2019-11-13 DIAGNOSIS — I341 Nonrheumatic mitral (valve) prolapse: Secondary | ICD-10-CM

## 2019-11-13 DIAGNOSIS — Z79899 Other long term (current) drug therapy: Secondary | ICD-10-CM | POA: Diagnosis not present

## 2019-11-13 MED ORDER — ATORVASTATIN CALCIUM 80 MG PO TABS: 80.0000 mg | ORAL_TABLET | Freq: Every day | ORAL | 6 refills | Status: DC

## 2019-11-13 MED ORDER — NITROGLYCERIN 0.4 MG SL SUBL: 0.4000 mg | SUBLINGUAL_TABLET | SUBLINGUAL | 3 refills | Status: AC | PRN

## 2019-11-13 MED ORDER — PANTOPRAZOLE SODIUM 40 MG PO TBEC: 40.0000 mg | DELAYED_RELEASE_TABLET | Freq: Every day | ORAL | 6 refills | Status: DC

## 2019-11-13 MED ORDER — OMEPRAZOLE 40 MG PO CPDR: 40.0000 mg | DELAYED_RELEASE_CAPSULE | Freq: Every day | ORAL | 3 refills | Status: DC

## 2019-11-13 MED ORDER — AMLODIPINE BESYLATE 10 MG PO TABS: 10.0000 mg | ORAL_TABLET | Freq: Every day | ORAL | 6 refills | Status: DC

## 2019-11-13 MED ORDER — CARVEDILOL 6.25 MG PO TABS: 6.2500 mg | ORAL_TABLET | Freq: Two times a day (BID) | ORAL | 6 refills | Status: DC

## 2019-11-13 NOTE — Progress Notes (Signed)
Office Visit    Patient Name: Connor Tyler Date of Encounter: 11/13/2019  Primary Care Provider:  Adin Hector, MD Primary Cardiologist:  Kathlyn Sacramento, MD  Chief Complaint    Chief Complaint  Patient presents with  . Other    Pt f/u from ED heartattack. Meds verbally reviewed w/ pt.    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  Past Medical History    Past Medical History:  Diagnosis Date  . Allergic rhinitis   . Anemia   . Asthma   . Coronary artery disease 11/11   Diffuse mild 3 vessell CAD by cardiac cath @ the [facility]  . ED (erectile dysfunction)   . GERD (gastroesophageal reflux disease)   . Heart murmur   . History of MRSA infection   . Hypertension   . Mitral regurgitation   . Valvular heart disease    Past Surgical History:  Procedure Laterality Date  . ANAL FISSURE REPAIR    . [facility]  . CATARACT EXTRACTION W/PHACO Left 01/25/2018   Procedure: CATARACT EXTRACTION PHACO AND INTRAOCULAR LENS PLACEMENT (IOC);  Surgeon: Birder Robson, MD;  Location: [facility] ORS;  Service: Ophthalmology;  Laterality: Left;  [place] 00:49 AP% 13.3 CDE 6.60 Fluid pack lot # [id number]  . COLONOSCOPY    . COLONOSCOPY WITH PROPOFOL N/A 09/14/2018   Procedure: COLONOSCOPY WITH PROPOFOL;  Surgeon: Manya Silvas, MD;  Location: [facility] ENDOSCOPY;  Service: Endoscopy;  Laterality: N/A;  . ESOPHAGOGASTRODUODENOSCOPY (EGD) WITH PROPOFOL N/A 09/14/2018   Procedure: ESOPHAGOGASTRODUODENOSCOPY (EGD) WITH PROPOFOL;  Surgeon: Manya Silvas, MD;  Location: [facility] ENDOSCOPY;  Service: Endoscopy;  Laterality: N/A;  IV ZOSYN  . TONSILLECTOMY      . wisdom teeth resected      Allergies  No Known Allergies  History of Present Illness    Connor Tyler is a 74 y.o. male with PMH as above.  He has a history of moderate regurgitation due to mitral valve prolapse.  He was evaluated in 2011 at the [facility] clinic for his mitral regurgitation.  It was concluded that the mitral regurgitation was moderate not severe and thus surgery was not indicated.  Cardiac catheterization showed mild three-vessel CAD without revascularization indicated he also has a history of hypertension, hyperlipidemia, and previous history of smoking.  He was seen in the office for palpitations, fatigue, and shortness of breath.  He subsequently underwent Lexiscan Myoview that showed no evidence of ischemia and normal ejection fraction echocardiogram showed normal LV systolic function and mild mitral regurgitation.  Holter monitor was done by his PCP and showed PVCs with short runs of SVT.  He subsequently cut down on caffeine intake with resolution of symptoms.  He was last seen in clinic by his primary cardiologist 05/12/2019, at which time he stated that he was feeling well.  His blood pressure was reasonably well controlled.  He was continued on a statin.  On November 10, 2019, he was admitted to [facility] through the emergency department after presenting with chest pain consistent with that of unstable angina.  He reported associated shortness of breath and fatigue, as well as diaphoresis.  He stated all of this began mid  swing while golfing after playing golf the past couple of days.  He reported that the chest discomfort continued; however, then it let up and he went to sleep.  However, when the chest pain was still present/returned the following day, he decided to present to the [facility] emergency department. Vitals showed SBP 153-180 and DBP 83-1 03.  Telemetry cardiac rhythm NSR, HR 63-69.  Labs showed sodium 137, Ca++ 8.7 mg/dL troponin 0 0.050 ng/mL, CK  total 116IU/L cholesterol 143.0, HDL 48 mg/dL, LDL 20m/dL,  triglycerides 76.0, albumin 4.0, alk phos 87, ALT 34, AST 38, WBC 6.2, RBC 4.3, hemoglobin 13.2, hematocrit 38.0, MCV 88, platelets 196 amylase 55, CO2 25 mmol/L, K+ 3.4 mmol/L, BUN 16.0, creatinine 0.7 mg/dL. CXR without acute dz. Coronary angiography was performed and revealed severe preocclusive disease in the dominant RCA and obtuse marginal branches.  He received PCI with DES with intracoronary ultrasound stent deployment guidance.  LVSF was normal by ventriculography and echocardiography. CD was provided for our office with the catheterization and echo images and placed in our EKG box for scanning. He was discharged on 11/11/2019.  Discharge medications included amlodipine 5 mg, ASA 81 mg, bisoprolol 10 mg, lisinopril 40 mg, lovastatin 40 mg, Brilinta 90 mg twice daily.  He did not receive sublingual nitroglycerin.  Discharge instructions were for a low sodium and low saturated fat diet, as well as no alcohol intake.  He was instructed to start daily walking.  The above information [facility] per review of EMR and will be obtained.  Today, he reports that he is feeling well since his cardiac catheterization.  He is joined today by his wife.  He denies any recurrence of chest pain.  He notes he occasionally gets short of breath at rest.  He has not yet started or been enrolled in cardiac rehab with plans to do so today.  He was not discharged with sublingual nitroglycerin with plans to provide the prescription today.  He denies any racing heart rate, palpitations, presyncope, syncope, orthopnea, PND, abdominal distention, lower extremity edema, or early satiety.  He was initially planned to go golfing this weekend with recommendation that he hold off on this trip given his most recent cardiac event.  We discussed his diet with recommendation for  Mediterranean diet and low-salt, as well as to reduce/stop his alcohol intake.  He reports medication compliance.  He was taking his ASA BID with recommendation for qd dosing.  Remainder of medication changes as below and A/P.  His BP was mildly elevated at 140/78 with recommendations as below.  He denies any signs or symptoms of bleeding.  He occasionally takes his blood pressure at home with recommendation to start twice daily checks with patient agreeable.  Home Medications    Prior to Admission medications   Medication Sig Start Date End Date Taking? Authorizing Provider  albuterol (PROAIR HFA) 108 (90 BASE) MCG/ACT inhaler Inhale 2 puffs into the lungs every 4 (four) hours as needed for wheezing or shortness of breath.    Yes [provider]  amLODipine (NORVASC) 5 MG tablet Take 1 tablet (5 mg total) by mouth daily. 01/18/19  Yes Dunn, RAreta Haber PA-C  aspirin EC 81 MG tablet Take 81 mg by mouth daily.    Yes [provider]  bisoprolol (ZEBETA) 10 MG tablet TAKE 1 TABLET EVERY DAY 07/05/15  Yes [provider]  cetirizine (ZYRTEC) 10  MG tablet Take 10 mg by mouth daily.    Yes [provider]  Coenzyme Q10 10 MG capsule Take 10 mg by mouth daily.    Yes [provider]  lisinopril (PRINIVIL,ZESTRIL) 40 MG tablet TAKE 1 TABLET ONE TIME DAILY 07/05/15  Yes [provider]  lovastatin (MEVACOR) 40 MG tablet TAKE 1 TABLET EVERY NIGHT 07/05/15  Yes [provider]  Melatonin 5 MG CAPS Take 10 mg by mouth at bedtime.    Yes [provider]  Multiple Vitamins-Minerals (ZINC PO) Take by mouth. Taking 1 capsule daily   Yes [provider]  omeprazole (PRILOSEC) 20 MG capsule Take 20 mg by mouth daily.   Yes [provider]  phenylephrine-shark liver oil-mineral oil-petrolatum (PREPARATION H) 0.25-3-14-71.9 % rectal ointment Place 1 application rectally 2 (two) times daily as needed for hemorrhoids.   Yes [provider]  vitamin B-12 (CYANOCOBALAMIN) 500 MCG tablet Take 500 mcg by mouth daily.   Yes [provider]  vitamin C (ASCORBIC ACID) 500 MG tablet Take 500 mg by mouth daily.   Yes [provider]    Review of Systems    He denies chest pain, palpitations, dyspnea, pnd, orthopnea, n, v, dizziness, syncope, edema, weight gain, or early satiety. He reports occasional and very brief SOB at rest.   All other systems reviewed and are otherwise negative except as noted above.  Physical Exam    VS:  BP 140/78 (BP Location: Left Arm, Patient Position: Sitting, Cuff Size: Normal)   Pulse (!) 56   Ht 5' 8" (1.727 m)   Wt 202 lb (91.6 kg)   SpO2 98%   BMI 30.71 kg/m  , BMI Body mass index is 30.71 kg/m. GEN: Well nourished, well developed, in no acute distress. HEENT: normal. Neck: Supple, no JVD, carotid bruits, or masses. Cardiac: RRR, 3/6 holosystolic murmur at the apex and radiating to the axilla, rubs, or gallops. No clubbing, cyanosis. Trace bilateral LEE.  Right radial arteriotomy site inspected and with clean and dry dressing removed and site without signs of edema, hematoma, or infection. radials/DP/PT 2+ and equal bilaterally.   Respiratory:  Respirations regular and unlabored, clear to auscultation bilaterally. GI: Soft, nontender, nondistended, BS + x 4. MS: no deformity or atrophy. Skin: warm and dry, no rash. Neuro:  Strength and sensation are intact. Psych: Normal affect.  Accessory Clinical Findings    ECG personally reviewed by me today - SB with sinus arrhythmia, 56bpm, QRS 102 ms, prolonged PRi 188ms - no acute changes.  VITALS Reviewed today   Temp Readings from Last 3 Encounters:  05/12/19 98.4 F (36.9 C)  02/24/19 98.6 F (37 C) (Temporal)  09/14/18 (!) 97.2 F (36.2 C) (Tympanic)   BP Readings from Last 3 Encounters:  11/13/19 140/78  05/12/19 140/70  02/24/19 140/78   Pulse Readings from Last 3 Encounters:  11/13/19 (!) 56    05/12/19 (!) 55  02/24/19 (!) 57    Wt Readings from Last 3 Encounters:  11/13/19 202 lb (91.6 kg)  05/12/19 200 lb 8 oz (90.9 kg)  02/24/19 199 lb 12.8 oz (90.6 kg)     LABS  reviewed today   [facility] Labs present? Yes/No: Yes, downloaded after request from [facility]: Labs showed sodium 137, Ca++ 8.7 mg/dL troponin 0 0.050 ng/mL, CK total 116IU/L cholesterol 143.0, HDL 48 mg/dL, LDL 80mg/dL,  triglycerides 76.0, albumin 4.0, alk phos 87, ALT 34, AST 38, WBC 6.2, RBC 4.3,   hemoglobin 13.2, hematocrit 38.0, MCV 88, platelets 196 amylase 55, CO2 25 mmol/L, K+ 3.4 mmol/L, BUN 16.0, creatinine 0.7 mg/dL. CXR without acute dz.   No results found for: WBC, HGB, HCT, MCV, PLT No results found for: CREATININE, BUN, NA, K, CL, CO2 No results found for: ALT, AST, GGT, ALKPHOS, BILITOT No results found for: CHOL, HDL, LDLCALC, LDLDIRECT, TRIG, CHOLHDL  No results found for: HGBA1C No results found for: TSH   STUDIES/PROCEDURES reviewed today   Pending record receipt from [facility]: 11/2019 LHC with PCI to RCA - [facility] 11/2019 Echo with nl LVSF- [facility]  Echo 03/22/2019 1. The left ventricle has normal systolic function with an ejection  fraction of 60-65%. The cavity size was normal. There is mildly increased  left ventricular wall thickness. Left ventricular diastolic Doppler  parameters are consistent with  pseudonormalization.  2. The right ventricle has normal systolic function. The cavity was  normal. There is no increase in right ventricular wall thickness. Right  ventricular systolic pressure is mildly elevated with an estimated  pressure of 38.8 mmHg.  3. Left atrial size was mild-moderately dilated.  4. Aortic valve regurgitation is mild to moderate   NM Lexiscan Study 01/20/2019  The study is normal.  This is a low risk study. The left ventricular ejection fraction is normal (55-65%).  Assessment & Plan    CAD with PCI / DES to  RCA x2 11/2019 History of Non-ST Elevation Myocardial infarction --No current chest pain.  As above in HPI, he had a non-ST elevation myocardial infarction with PCI/DES x2 to the RCA after admission 3/5 to [facility] with records of cath and echo to be scanned into the EMR.  Troponin before cath 0.05. --Continue DAPT for at least 1 year. Continue Brilinta 90mg BID, prescribed at time of discharge from [facility], and with refills provided if needed. Continue  ASA 81 mg; however, he had been instructed to take twice daily and thus reduced the dosing to only once daily.  No current signs or symptoms consistent with bleeding. Changed his omeprazole to Protonix 40mg daily. He is bradycardic today on bisoprolol 10mg with poorly controlled BP and report of history of asthma / breathing issues. Thus, changed his bisoprolol 10mg to to Coreg 6.25 mg twice daily and to be escalated at RTC as tolerated. This may prove a better fit for him, given his need for BP control and his current bradycardia, as well as given his recent NSTEMI. Changed his lovastatin to atorvastatin 80 mg for high intensity statin therapy. His most recent LDL is not at goal as below. We will recheck his lipid and liver function in 6-8 weeks and consider Zetia verus PCSK9i at that time if indicated. Continue current lisinopril 40mg daily. He is also on amlodipine, which I elected to increase for BP support today as outlined below. Provided him with PRN SL nitro, as this was not provided for him at discharge. We reviewed the indications in which he should use this medication and that he should call 911 if taking his 3rd dose and still having CP. He is not on a PTA diuretic but euvolemic on exam. We reviewed the s/sx of volume overload and recommend daily weights and BP checks to monitor. He will call the office if he notices any of the s/sx of volume overload reviewed today. Enrolled him in cardiac rehab, as this was not done at discharge.  Scheduled him for an echo to reassess pump   function and MV in 6 months s/p NSTEMI.  Most recent [facility] echo as above with EF nl and [facility] cath and echo provided on CD to be scanned into EMR. Recommend aggressive risk factor modification. Discussed dietary and lifestyle changes.    MR 2/2 MVP --As above, echo in 6 months. He is reportedly asx with this murmur. Continue to monitor with periodic echo (and also to monitor pulmonary pressure with most recent [facility] echo as above and [facility] Echo pending scanning into EMR). Continue HR and BP control.   HTN --BP poorly controlled today. Discussed checking his BP twice daily with BP goal 130/60 or less. He is agreeable. Increased his amlodipine to 10mg daily for additional BP support. Continue with current lisinopril 40mg daily. Changed bisoprolol to Coreg 6.25mg BID as above for more BP support and given bradycardic rate. Low salt diet recommended. Discussed alcohol intake and need to reduce / cessation and low salt diet.   HLD --LDLc 80 with goal LDL <70. Will change lovastatin to atorvastatin 80mg with lipid and liver check in 6-8 weeks. Please refer to above labs for most recent [facility] lipid and liver function. If LDL still not at goal on lab recheck, consider addition of Zetia versus PCSK9i for lipid control and risk factor control. Reviewed Mediterranean diet recommendations.   Palpitations due to PVCs --Continue to limit caffeine intake. He did not report any continued palpitations today.   Bradycardia --Asx in bradycardia. Bisoprolol changed to Coreg 6.25mg BID as above.   Medication changes: ASA 81 mg dosing to qd (as he had been told BID).  Changed omeprazole to Protonix 40mg daily.  Changed bisoprolol 10mg to to Coreg 6.25 mg twice daily (given asthma, recent NSTEMI, bradycardia/HTN). Lovastatin changed to atorvastatin 80 mg for high intensity statin therapy. Provided him with PRN SL nitro. Increased amlodipine to 10mg qd  given BP today.  Provide refills if needed on any and all medications, including Brilinta 90mg BID.  Labs ordered: BMET, Mg, CBC, Lipid and liver function now and in 6-8 weeks Studies / Imaging ordered: Cardiac rehab referral provided Future considerations: Echo in 6 months, Lipid and liver function now and in 6-8 weeks Disposition: RTC 2 weeks  Total time spent with patient today 45 minutes. This includes reviewing records, evaluating the patient, and coordinating care. Face-to-face time >50%.     D , PA-C 11/13/2019   

## 2019-11-13 NOTE — Patient Instructions (Addendum)
Medication Instructions:  - Your physician has recommended you make the following change in your medication:   1) Stop bisoprolol  2) Start coreg (carvedilol) 6.25 mg- take 1 tablet by mouth twice a day (every 12 hours)  3) Stop lovastatin  4) Start lipitor (atorvastatin) 80 mg- take 1 tablet by mouth once a day at bedtime  5) Increase amlodipine to 10 mg- take 1 tablet by mouth once daily  6) Start nitroglycerin 0.4 mg tablets- take 1 tablet every 5 minutes up to 3 doses as needed for chest pain  7) Stop prilosec  8) Start protonix 40 mg- take 1 tablet by mouth once daily   *If you need a refill on your cardiac medications before your next appointment, please call your pharmacy*   Lab Work: - Your physician recommends that you today lab work today: BMP/ CBC  - Your physician recommends that you return for FASTING lab work in: 6-8 weeks(4/19-5/3) (Lipid/ Liver)  - Please come to the Medical Mall at [facility], 1st desk on the right (registration) once you pass the screening table - No appointment needed: Monday -Friday (7:30 am- 5:30 pm)    If you have labs (blood work) drawn today and your tests are completely normal, you will receive your results only by: Marland Kitchen MyChart Message (if you have MyChart) OR . A paper copy in the mail If you have any lab test that is abnormal or we need to change your treatment, we will call you to review the results.   Testing/Procedures: - You have been referred to : Cardiac Rehab at [facility]- they will call you to schedule an orientation  - Your physician has requested that you have an echocardiogram in 6 months. Echocardiography is a painless test that uses sound waves to create images of your heart. It provides your doctor with information about the size and shape of your heart and how well your heart's chambers and valves are working. This procedure takes approximately one hour. There are no restrictions for this procedure.   Follow-Up: At [facility], you and your health needs are our priority.  As part of our continuing mission to provide you with exceptional heart care, we have created designated Provider Care Teams.  These Care Teams include your primary Cardiologist (physician) and Advanced Practice Providers (APPs -  Physician Assistants and Nurse Practitioners) who all work together to provide you with the care you need, when you need it.  We recommend signing up for the patient portal called "MyChart".  Sign up information is provided on this After Visit Summary.  MyChart is used to connect with patients for Virtual Visits (Telemedicine).  Patients are able to view lab/test results, encounter notes, upcoming appointments, etc.  Non-urgent messages can be sent to your provider as well.   To learn more about what you can do with MyChart, go to [email or web address].    Your next appointment:   2 week(s)  The format for your next appointment:   In Person  Provider:    You may see Lorine Bears, MD or one of the following Advanced Practice Providers on your designated Care Team:    Nicolasa Ducking, NP  Eula Listen, PA-C  Marisue Ivan, PA-C    Other Instructions: - call the office to let us know what antiplatelet medication you were placed on (brilinta/ effient/ clopidegrel are the most common)  - monitor you blood pressure and heart rate at home twice a day  - call the office if  you are having blood pressure readings of < 90 on the top # or heart rates < 50 bpm and you are not feeling well (dizzy/ lightheaded)  - Your chart is being forwarded to Dr. Fletcher Anon to review as well

## 2019-11-14 ENCOUNTER — Telehealth: Payer: Self-pay | Admitting: Physician Assistant

## 2019-11-14 ENCOUNTER — Other Ambulatory Visit: Payer: Self-pay | Admitting: *Deleted

## 2019-11-14 DIAGNOSIS — Z955 Presence of coronary angioplasty implant and graft: Secondary | ICD-10-CM

## 2019-11-14 DIAGNOSIS — I214 Non-ST elevation (NSTEMI) myocardial infarction: Secondary | ICD-10-CM

## 2019-11-14 LAB — CBC WITH DIFFERENTIAL/PLATELET
Basophils Absolute: 0 10*3/uL (ref 0.0–0.2)
Basos: 1 %
EOS (ABSOLUTE): 0.2 10*3/uL (ref 0.0–0.4)
Eos: 3 %
Hematocrit: 39.5 % (ref 37.5–51.0)
Hemoglobin: 14 g/dL (ref 13.0–17.7)
Immature Grans (Abs): 0 10*3/uL (ref 0.0–0.1)
Immature Granulocytes: 0 %
Lymphocytes Absolute: 1 10*3/uL (ref 0.7–3.1)
Lymphs: 18 %
MCH: 31.5 pg (ref 26.6–33.0)
MCHC: 35.4 g/dL (ref 31.5–35.7)
MCV: 89 fL (ref 79–97)
Monocytes Absolute: 0.7 10*3/uL (ref 0.1–0.9)
Monocytes: 13 %
Neutrophils Absolute: 3.3 10*3/uL (ref 1.4–7.0)
Neutrophils: 65 %
Platelets: 215 10*3/uL (ref 150–450)
RBC: 4.45 x10E6/uL (ref 4.14–5.80)
RDW: 13.1 % (ref 11.6–15.4)
WBC: 5.2 10*3/uL (ref 3.4–10.8)

## 2019-11-14 LAB — BASIC METABOLIC PANEL
BUN/Creatinine Ratio: 20 (ref 10–24)
BUN: 18 mg/dL (ref 8–27)
CO2: 21 mmol/L (ref 20–29)
Calcium: 9.9 mg/dL (ref 8.6–10.2)
Chloride: 105 mmol/L (ref 96–106)
Creatinine, Ser: 0.91 mg/dL (ref 0.76–1.27)
GFR calc Af Amer: 97 mL/min/{1.73_m2} (ref >59)
GFR calc non Af Amer: 84 mL/min/{1.73_m2} (ref >59)
Glucose: 101 mg/dL — ABNORMAL HIGH (ref 65–99)
Potassium: 4.4 mmol/L (ref 3.5–5.2)
Sodium: 143 mmol/L (ref 134–144)

## 2019-11-14 MED ORDER — TICAGRELOR 90 MG PO TABS: 90.0000 mg | ORAL_TABLET | Freq: Two times a day (BID) | ORAL | 6 refills | Status: DC

## 2019-11-14 NOTE — Telephone Encounter (Signed)
Patient was told to call back after ov when he remembered he takes Brilinta 90 mg BID

## 2019-11-14 NOTE — Telephone Encounter (Signed)
Spoke with patient. States the doctor at [facility] gave him Brilinta 90 mg two times a day after his stent was placed. He will need refill in a few weeks. Routing to [place] to make her aware and to verify ok to send in the refill.

## 2019-11-14 NOTE — Telephone Encounter (Signed)
Yes, please make sure that he has refills of his Brilinta 90mg  BID. We can prescribe 3 months with 3 refills, if that is easiest for him. Thank you!

## 2019-11-20 ENCOUNTER — Telehealth: Payer: Self-pay

## 2019-11-20 NOTE — Telephone Encounter (Signed)
Spoke with patient regarding results and recommendation.  Patient verbalizes understanding and is agreeable to plan of care. Advised patient to call back with any issues or concerns.  

## 2019-11-20 NOTE — Telephone Encounter (Signed)
-----   Message from Jacquelyn D Visser, PA-C sent at 11/16/2019  3:58 PM EST ----- Good news! Please let Mr. Dykes know that his labs all came back OK. His hemoglobin is stable following initiation of his Brilinta and when compared with the labs from [facility]. His renal function and electrolytes are also stable from previous labs. No changes to current treatment plan or medications.  

## 2019-11-20 NOTE — Telephone Encounter (Signed)
-----   Message from Lennon Alstrom, PA-C sent at 11/16/2019  3:58 PM EST ----- Good news! Please let Mr. Connor Tyler know that his labs all came back OK. His hemoglobin is stable following initiation of his Brilinta and when compared with the labs from [facility]. His renal function and electrolytes are also stable from previous labs. No changes to current treatment plan or medications.

## 2019-11-20 NOTE — Telephone Encounter (Signed)
Left message on patients voicemail to please return our call.   

## 2019-11-21 ENCOUNTER — Other Ambulatory Visit: Payer: Self-pay

## 2019-11-21 ENCOUNTER — Encounter: Payer: Medicare Other | Attending: Cardiovascular Disease

## 2019-11-21 DIAGNOSIS — I214 Non-ST elevation (NSTEMI) myocardial infarction: Secondary | ICD-10-CM

## 2019-11-21 DIAGNOSIS — Z955 Presence of coronary angioplasty implant and graft: Secondary | ICD-10-CM | POA: Insufficient documentation

## 2019-11-21 NOTE — Progress Notes (Signed)
Virtual Orientation performed. Patient informed when to come in for RD and EP orientation. Diagnosis can be found in Care Everywhere 11/10/2019.

## 2019-11-22 VITALS — Ht 68.5 in | Wt 198.9 lb

## 2019-11-22 DIAGNOSIS — Z955 Presence of coronary angioplasty implant and graft: Secondary | ICD-10-CM

## 2019-11-22 DIAGNOSIS — I214 Non-ST elevation (NSTEMI) myocardial infarction: Secondary | ICD-10-CM

## 2019-11-22 NOTE — Patient Instructions (Signed)
Patient Instructions  Patient Details  Name: Connor Tyler MRN: [id number] Date of Birth: 1946/08/28 Referring Provider:  Iran Ouch, MD  Below are your personal goals for exercise, nutrition, and risk factors. Our goal is to help you stay on track towards obtaining and maintaining these goals. We will be discussing your progress on these goals with you throughout the program.  Initial Exercise Prescription: Initial Exercise Prescription - 11/22/19 1300      Date of Initial Exercise RX and Referring Provider   Date  11/22/19    Referring Provider  Arida      Treadmill   MPH  3    Grade  0    Minutes  15    METs  3.33      Recumbant Bike   Level  3    RPM  60    Watts  38    Minutes  15    METs  3.33      Elliptical   Level  1    Speed  3    Minutes  15      REL-XR   Level  3    Speed  50    Minutes  15    METs  3.3      Prescription Details   Frequency (times per week)  3    Duration  Progress to 30 minutes of continuous aerobic without signs/symptoms of physical distress      Intensity   THRR 40-80% of Max Heartrate  106-134    Ratings of Perceived Exertion  11-15    Perceived Dyspnea  0-4      Resistance Training   Training Prescription  Yes    Weight  6 lb    Reps  10-15       Exercise Goals: Frequency: Be able to perform aerobic exercise two to three times per week in program working toward 2-5 days per week of home exercise.  Intensity: Work with a perceived exertion of 11 (fairly light) - 15 (hard) while following your exercise prescription.  We will make changes to your prescription with you as you progress through the program.   Duration: Be able to do 30 to 45 minutes of continuous aerobic exercise in addition to a 5 minute warm-up and a 5 minute cool-down routine.   Nutrition Goals: Your personal nutrition goals will be established when you do your nutrition analysis with the dietician.  The following are general nutrition  guidelines to follow: Cholesterol < 200mg /day Sodium < 1500mg /day Fiber: Men over 50 yrs - 30 grams per day  Personal Goals: Personal Goals and Risk Factors at Admission - 11/22/19 1318      Core Components/Risk Factors/Patient Goals on Admission    Weight Management  Yes    Intervention  Weight Management: Develop a combined nutrition and exercise program designed to reach desired caloric intake, while maintaining appropriate intake of nutrient and fiber, sodium and fats, and appropriate energy expenditure required for the weight goal.;Weight Management/Obesity: Establish reasonable short term and long term weight goals.;Weight Management: Provide education and appropriate resources to help participant work on and attain dietary goals.    Admit Weight  198 lb 14.4 oz (90.2 kg)    Expected Outcomes  Short Term: Continue to assess and modify interventions until short term weight is achieved;Weight Maintenance: Understanding of the daily nutrition guidelines, which includes 25-35% calories from fat, 7% or less cal from saturated fats, less than 200mg  cholesterol, less than  1.5gm of sodium, & 5 or more servings of fruits and vegetables daily;Long Term: Adherence to nutrition and physical activity/exercise program aimed toward attainment of established weight goal;Weight Loss: Understanding of general recommendations for a balanced deficit meal plan, which promotes 1-2 lb weight loss per week and includes a negative energy balance of [phone] kcal/d;Understanding recommendations for meals to include 15-35% energy as protein, 25-35% energy from fat, 35-60% energy from carbohydrates, less than 200mg  of dietary cholesterol, 20-35 gm of total fiber daily    Hypertension  Yes    Intervention  Provide education on lifestyle modifcations including regular physical activity/exercise, weight management, moderate sodium restriction and increased consumption of fresh fruit, vegetables, and low fat dairy, alcohol  moderation, and smoking cessation.;Monitor prescription use compliance.    Expected Outcomes  Short Term: Continued assessment and intervention until BP is < 140/59mm HG in hypertensive participants. < 130/13mm HG in hypertensive participants with diabetes, heart failure or chronic kidney disease.;Long Term: Maintenance of blood pressure at goal levels.    Intervention  Provide education and support for participant on nutrition & aerobic/resistive exercise along with prescribed medications to achieve LDL 70mg , HDL >40mg .    Expected Outcomes  Short Term: Participant states understanding of desired cholesterol values and is compliant with medications prescribed. Participant is following exercise prescription and nutrition guidelines.;Long Term: Cholesterol controlled with medications as prescribed, with individualized exercise RX and with personalized nutrition plan. Value goals: LDL < 70mg , HDL > 40 mg.       Tobacco Use Initial Evaluation: Social History   Tobacco Use  Smoking Status Former Smoker  . Packs/day: 1.00  . Years: 13.00  . Pack years: 13.00  . Types: Cigarettes  . Quit date: 82  . Years since quitting: 49.2  Smokeless Tobacco Never Used    Exercise Goals and Review: Exercise Goals    Row Name 11/22/19 1317             Exercise Goals   Increase Physical Activity  Yes       Intervention  Provide advice, education, support and counseling about physical activity/exercise needs.;Develop an individualized exercise prescription for aerobic and resistive training based on initial evaluation findings, risk stratification, comorbidities and participant's personal goals.       Expected Outcomes  Short Term: Attend rehab on a regular basis to increase amount of physical activity.;Long Term: Add in home exercise to make exercise part of routine and to increase amount of physical activity.;Long Term: Exercising regularly at least 3-5 days a week.       Increase Strength and Stamina   Yes       Intervention  Provide advice, education, support and counseling about physical activity/exercise needs.;Develop an individualized exercise prescription for aerobic and resistive training based on initial evaluation findings, risk stratification, comorbidities and participant's personal goals.       Expected Outcomes  Short Term: Increase workloads from initial exercise prescription for resistance, speed, and METs.;Short Term: Perform resistance training exercises routinely during rehab and add in resistance training at home;Long Term: Improve cardiorespiratory fitness, muscular endurance and strength as measured by increased METs and functional capacity (6MWT)       Able to understand and use rate of perceived exertion (RPE) scale  Yes       Intervention  Provide education and explanation on how to use RPE scale       Expected Outcomes  Short Term: Able to use RPE daily in rehab to express subjective intensity level;Long Term:  Able to use RPE to guide intensity level when exercising independently       Able to understand and use Dyspnea scale  Yes       Intervention  Provide education and explanation on how to use Dyspnea scale       Expected Outcomes  Short Term: Able to use Dyspnea scale daily in rehab to express subjective sense of shortness of breath during exertion;Long Term: Able to use Dyspnea scale to guide intensity level when exercising independently       Knowledge and understanding of Target Heart Rate Range (THRR)  Yes       Intervention  Provide education and explanation of THRR including how the numbers were predicted and where they are located for reference       Expected Outcomes  Short Term: Able to state/look up THRR;Short Term: Able to use daily as guideline for intensity in rehab;Long Term: Able to use THRR to govern intensity when exercising independently       Able to check pulse independently  Yes       Intervention  Provide education and demonstration on how to check  pulse in carotid and radial arteries.;Review the importance of being able to check your own pulse for safety during independent exercise       Expected Outcomes  Short Term: Able to explain why pulse checking is important during independent exercise;Long Term: Able to check pulse independently and accurately       Understanding of Exercise Prescription  Yes       Intervention  Provide education, explanation, and written materials on patient's individual exercise prescription       Expected Outcomes  Short Term: Able to explain program exercise prescription;Long Term: Able to explain home exercise prescription to exercise independently          Copy of goals given to participant.

## 2019-11-22 NOTE — Progress Notes (Signed)
Cardiac Individual Treatment Plan  Patient Details  Name: Connor Tyler MRN: [id number] Date of Birth: [birth year] Referring Provider:     Cardiac Rehab from 11/22/2019 in [facility] Cardiac and Pulmonary Rehab  Referring Provider  Arida      Initial Encounter Date:    Cardiac Rehab from 11/22/2019 in [facility] Cardiac and Pulmonary Rehab  Date  11/22/19      Visit Diagnosis: NSTEMI (non-ST elevation myocardial infarction) [facility])  Status post coronary artery stent placement  Patient's Home Medications on Admission:  Current Outpatient Medications:  .  albuterol (PROAIR HFA) 108 (90 BASE) MCG/ACT inhaler, Inhale 2 puffs into the lungs every 4 (four) hours as needed for wheezing or shortness of breath. , Disp: , Rfl:  .  amLODipine (NORVASC) 10 MG tablet, Take 1 tablet (10 mg total) by mouth daily., Disp: 30 tablet, Rfl: 6 .  aspirin EC 81 MG tablet, Take 81 mg by mouth daily. , Disp: , Rfl:  .  atorvastatin (LIPITOR) 80 MG tablet, Take 1 tablet (80 mg total) by mouth daily., Disp: 30 tablet, Rfl: 6 .  carvedilol (COREG) 6.25 MG tablet, Take 1 tablet (6.25 mg total) by mouth 2 (two) times daily., Disp: 60 tablet, Rfl: 6 .  cetirizine (ZYRTEC) 10 MG tablet, Take 10 mg by mouth daily. , Disp: , Rfl:  .  Coenzyme Q10 10 MG capsule, Take 10 mg by mouth daily. , Disp: , Rfl:  .  lisinopril (PRINIVIL,ZESTRIL) 40 MG tablet, TAKE 1 TABLET ONE TIME DAILY, Disp: , Rfl:  .  Melatonin 5 MG CAPS, Take 10 mg by mouth at bedtime. , Disp: , Rfl:  .  Multiple Vitamins-Minerals (ZINC PO), Take by mouth. Taking 1 capsule daily, Disp: , Rfl:  .  nitroGLYCERIN (NITROSTAT) 0.4 MG SL tablet, Place 1 tablet (0.4 mg total) under the tongue every 5 (five) minutes as needed for chest pain., Disp: 25 tablet, Rfl: 3 .  pantoprazole (PROTONIX) 40 MG tablet, Take 1 tablet (40 mg total) by mouth daily., Disp: 30 tablet, Rfl: 6 .  phenylephrine-shark liver oil-mineral oil-petrolatum (PREPARATION H) 0.25-3-14-71.9 % rectal  ointment, Place 1 application rectally 2 (two) times daily as needed for hemorrhoids., Disp: , Rfl:  .  ticagrelor (BRILINTA) 90 MG TABS tablet, Take 1 tablet (90 mg total) by mouth 2 (two) times daily., Disp: 60 tablet, Rfl: 6 .  vitamin B-12 (CYANOCOBALAMIN) 500 MCG tablet, Take 500 mcg by mouth daily., Disp: , Rfl:  .  vitamin C (ASCORBIC ACID) 500 MG tablet, Take 500 mg by mouth daily., Disp: , Rfl:   Past Medical History: Past Medical History:  Diagnosis Date  . Allergic rhinitis   . Anemia   . Asthma   . Coronary artery disease 11/11   Diffuse mild 3 vessell CAD by cardiac cath @ the [facility]  . ED (erectile dysfunction)   . GERD (gastroesophageal reflux disease)   . Heart murmur   . History of MRSA infection   . Hypertension   . Mitral regurgitation   . Valvular heart disease     Tobacco Use: Social History   Tobacco Use  Smoking Status Former Smoker  . Packs/day: 1.00  . Years: 13.00  . Pack years: 13.00  . Types: Cigarettes  . Quit date: 39  . Years since quitting: 49.2  Smokeless Tobacco Never Used    Labs: Recent Review Flowsheet Data    There is no flowsheet data to display.       Exercise  Target Goals: Exercise Program Goal: Individual exercise prescription set using results from initial 6 min walk test and THRR while considering  patient's activity barriers and safety.   Exercise Prescription Goal: Initial exercise prescription builds to 30-45 minutes a day of aerobic activity, 2-3 days per week.  Home exercise guidelines will be given to patient during program as part of exercise prescription that the participant will acknowledge.  Activity Barriers & Risk Stratification:   6 Minute Walk: 6 Minute Walk    Row Name 11/22/19 1311         6 Minute Walk   Phase  Initial     Distance  1698 feet     Walk Time  6 minutes     # of Rest Breaks  0     MPH  3.2     METS  3.33     RPE  9     Perceived Dyspnea   0     VO2 Peak  11.7      Symptoms  No     Resting HR  78 bpm     Resting BP  134/66     Resting Oxygen Saturation   96 %     Exercise Oxygen Saturation  during 6 min walk  98 %     Max Ex. HR  104 bpm     Max Ex. BP  122/56     2 Minute Post BP  116/60        Oxygen Initial Assessment:   Oxygen Re-Evaluation:   Oxygen Discharge (Final Oxygen Re-Evaluation):   Initial Exercise Prescription: Initial Exercise Prescription - 11/22/19 1300      Date of Initial Exercise RX and Referring Provider   Date  11/22/19    Referring Provider  Arida      Treadmill   MPH  3    Grade  0    Minutes  15    METs  3.33      Recumbant Bike   Level  3    RPM  60    Watts  38    Minutes  15    METs  3.33      Elliptical   Level  1    Speed  3    Minutes  15      REL-XR   Level  3    Speed  50    Minutes  15    METs  3.3      Prescription Details   Frequency (times per week)  3    Duration  Progress to 30 minutes of continuous aerobic without signs/symptoms of physical distress      Intensity   THRR 40-80% of Max Heartrate  106-134    Ratings of Perceived Exertion  11-15    Perceived Dyspnea  0-4      Resistance Training   Training Prescription  Yes    Weight  6 lb    Reps  10-15       Perform Capillary Blood Glucose checks as needed.  Exercise Prescription Changes: Exercise Prescription Changes    Row Name 11/22/19 1300             Response to Exercise   Blood Pressure (Admit)  134/66       Blood Pressure (Exercise)  122/56       Blood Pressure (Exit)  116/60       Heart Rate (Admit)  78 bpm  Heart Rate (Exercise)  104 bpm       Heart Rate (Exit)  86 bpm       Oxygen Saturation (Admit)  96 %       Oxygen Saturation (Exercise)  98 %       Rating of Perceived Exertion (Exercise)  9       Perceived Dyspnea (Exercise)  0       Symptoms  none          Exercise Comments:   Exercise Goals and Review: Exercise Goals    Row Name 11/22/19 1317             Exercise  Goals   Increase Physical Activity  Yes       Intervention  Provide advice, education, support and counseling about physical activity/exercise needs.;Develop an individualized exercise prescription for aerobic and resistive training based on initial evaluation findings, risk stratification, comorbidities and participant's personal goals.       Expected Outcomes  Short Term: Attend rehab on a regular basis to increase amount of physical activity.;Long Term: Add in home exercise to make exercise part of routine and to increase amount of physical activity.;Long Term: Exercising regularly at least 3-5 days a week.       Increase Strength and Stamina  Yes       Intervention  Provide advice, education, support and counseling about physical activity/exercise needs.;Develop an individualized exercise prescription for aerobic and resistive training based on initial evaluation findings, risk stratification, comorbidities and participant's personal goals.       Expected Outcomes  Short Term: Increase workloads from initial exercise prescription for resistance, speed, and METs.;Short Term: Perform resistance training exercises routinely during rehab and add in resistance training at home;Long Term: Improve cardiorespiratory fitness, muscular endurance and strength as measured by increased METs and functional capacity (6MWT)       Able to understand and use rate of perceived exertion (RPE) scale  Yes       Intervention  Provide education and explanation on how to use RPE scale       Expected Outcomes  Short Term: Able to use RPE daily in rehab to express subjective intensity level;Long Term:  Able to use RPE to guide intensity level when exercising independently       Able to understand and use Dyspnea scale  Yes       Intervention  Provide education and explanation on how to use Dyspnea scale       Expected Outcomes  Short Term: Able to use Dyspnea scale daily in rehab to express subjective sense of shortness of  breath during exertion;Long Term: Able to use Dyspnea scale to guide intensity level when exercising independently       Knowledge and understanding of Target Heart Rate Range (THRR)  Yes       Intervention  Provide education and explanation of THRR including how the numbers were predicted and where they are located for reference       Expected Outcomes  Short Term: Able to state/look up THRR;Short Term: Able to use daily as guideline for intensity in rehab;Long Term: Able to use THRR to govern intensity when exercising independently       Able to check pulse independently  Yes       Intervention  Provide education and demonstration on how to check pulse in carotid and radial arteries.;Review the importance of being able to check your own pulse for safety during independent exercise  Expected Outcomes  Short Term: Able to explain why pulse checking is important during independent exercise;Long Term: Able to check pulse independently and accurately       Understanding of Exercise Prescription  Yes       Intervention  Provide education, explanation, and written materials on patient's individual exercise prescription       Expected Outcomes  Short Term: Able to explain program exercise prescription;Long Term: Able to explain home exercise prescription to exercise independently          Exercise Goals Re-Evaluation :   Discharge Exercise Prescription (Final Exercise Prescription Changes): Exercise Prescription Changes - 11/22/19 1300      Response to Exercise   Blood Pressure (Admit)  134/66    Blood Pressure (Exercise)  122/56    Blood Pressure (Exit)  116/60    Heart Rate (Admit)  78 bpm    Heart Rate (Exercise)  104 bpm    Heart Rate (Exit)  86 bpm    Oxygen Saturation (Admit)  96 %    Oxygen Saturation (Exercise)  98 %    Rating of Perceived Exertion (Exercise)  9    Perceived Dyspnea (Exercise)  0    Symptoms  none       Nutrition:  Target Goals: Understanding of nutrition  guidelines, daily intake of sodium <1573m, cholesterol <2041m calories 30% from fat and 7% or less from saturated fats, daily to have 5 or more servings of fruits and vegetables.  Biometrics: Pre Biometrics - 11/22/19 1317      Pre Biometrics   Height  5' 8.5" (1.74 m)    Weight  198 lb 14.4 oz (90.2 kg)    BMI (Calculated)  29.8    Single Leg Stand  30 seconds        Nutrition Therapy Plan and Nutrition Goals:   Nutrition Assessments: Nutrition Assessments - 11/22/19 1253      MEDFICTS Scores   Pre Score  100       Nutrition Goals Re-Evaluation:   Nutrition Goals Discharge (Final Nutrition Goals Re-Evaluation):   Psychosocial: Target Goals: Acknowledge presence or absence of significant depression and/or stress, maximize coping skills, provide positive support system. Participant is able to verbalize types and ability to use techniques and skills needed for reducing stress and depression.   Initial Review & Psychosocial Screening: Initial Psych Review & Screening - 11/21/19 1310      Initial Review   Current issues with  Current Sleep Concerns;Current Stress Concerns    Source of Stress Concerns  Financial    Comments  He has cut back on work from re[employer]nd his stress levels have been lower.      Family Dynamics   Good Support System?  Yes    Comments  He can look to his wife and son.      Barriers   Psychosocial barriers to participate in program  There are no identifiable barriers or psychosocial needs.;The patient should benefit from training in stress management and relaxation.      Screening Interventions   Interventions  Encouraged to exercise;To provide support and resources with identified psychosocial needs;Provide feedback about the scores to participant    Expected Outcomes  Short Term goal: Utilizing psychosocial counselor, staff and physician to assist with identification of specific Stressors or current issues interfering with healing process.  Setting desired goal for each stressor or current issue identified.;Long Term Goal: Stressors or current issues are controlled or eliminated.;Short Term goal: Identification and  review with participant of any Quality of Life or Depression concerns found by scoring the questionnaire.;Long Term goal: The participant improves quality of Life and PHQ9 Scores as seen by post scores and/or verbalization of changes       Quality of Life Scores:   Scores of 19 and below usually indicate a poorer quality of life in these areas.  A difference of  2-3 points is a clinically meaningful difference.  A difference of 2-3 points in the total score of the Quality of Life Index has been associated with significant improvement in overall quality of life, self-image, physical symptoms, and general health in studies assessing change in quality of life.  PHQ-9: Recent Review Flowsheet Data    There is no flowsheet data to display.     Interpretation of Total Score  Total Score Depression Severity:  1-4 = Minimal depression, 5-9 = Mild depression, 10-14 = Moderate depression, 15-19 = Moderately severe depression, 20-27 = Severe depression   Psychosocial Evaluation and Intervention: Psychosocial Evaluation - 11/21/19 1312      Psychosocial Evaluation & Interventions   Interventions  Encouraged to exercise with the program and follow exercise prescription    Comments  He has cut back on work from [employer] and his stress levels have been lower.    Expected Outcomes  Short: Attend [facility] stress management education to decrease stress. Long: Maintain exercise Post HeartTrack to keep stress at a minimum.    Continue Psychosocial Services   Follow up required by staff       Psychosocial Re-Evaluation:   Psychosocial Discharge (Final Psychosocial Re-Evaluation):   Vocational Rehabilitation: Provide vocational rehab assistance to qualifying candidates.   Vocational Rehab Evaluation &  Intervention:   Education: Education Goals: Education classes will be provided on a variety of topics geared toward better understanding of heart health and risk factor modification. Participant will state understanding/return demonstration of topics presented as noted by education test scores.  Learning Barriers/Preferences: Learning Barriers/Preferences - 11/21/19 1313      Learning Barriers/Preferences   Learning Barriers  Sight    Learning Preferences  None       Education Topics:  AED/CPR: - Group verbal and written instruction with the use of models to demonstrate the basic use of the AED with the basic ABC's of resuscitation.   General Nutrition Guidelines/Fats and Fiber: -Group instruction provided by verbal, written material, models and posters to present the general guidelines for heart healthy nutrition. Gives an explanation and review of dietary fats and fiber.   Controlling Sodium/Reading Food Labels: -Group verbal and written material supporting the discussion of sodium use in heart healthy nutrition. Review and explanation with models, verbal and written materials for utilization of the food label.   Exercise Physiology & General Exercise Guidelines: - Group verbal and written instruction with models to review the exercise physiology of the cardiovascular system and associated critical values. Provides general exercise guidelines with specific guidelines to those with heart or lung disease.    Aerobic Exercise & Resistance Training: - Gives group verbal and written instruction on the various components of exercise. Focuses on aerobic and resistive training programs and the benefits of this training and how to safely progress through these programs..   Flexibility, Balance, Mind/Body Relaxation: Provides group verbal/written instruction on the benefits of flexibility and balance training, including mind/body exercise modes such as yoga, pilates and tai chi.   Demonstration and skill practice provided.   Stress and Anxiety: - Provides group verbal and  written instruction about the health risks of elevated stress and causes of high stress.  Discuss the correlation between heart/lung disease and anxiety and treatment options. Review healthy ways to manage with stress and anxiety.   Depression: - Provides group verbal and written instruction on the correlation between heart/lung disease and depressed mood, treatment options, and the stigmas associated with seeking treatment.   Anatomy & Physiology of the Heart: - Group verbal and written instruction and models provide basic cardiac anatomy and physiology, with the coronary electrical and arterial systems. Review of Valvular disease and Heart Failure   Cardiac Procedures: - Group verbal and written instruction to review commonly prescribed medications for heart disease. Reviews the medication, class of the drug, and side effects. Includes the steps to properly store meds and maintain the prescription regimen. (beta blockers and nitrates)   Cardiac Medications I: - Group verbal and written instruction to review commonly prescribed medications for heart disease. Reviews the medication, class of the drug, and side effects. Includes the steps to properly store meds and maintain the prescription regimen.   Cardiac Medications II: -Group verbal and written instruction to review commonly prescribed medications for heart disease. Reviews the medication, class of the drug, and side effects. (all other drug classes)    Go Sex-Intimacy & Heart Disease, Get SMART - Goal Setting: - Group verbal and written instruction through game format to discuss heart disease and the return to sexual intimacy. Provides group verbal and written material to discuss and apply goal setting through the application of the S.M.A.R.T. Method.   Other Matters of the Heart: - Provides group verbal, written materials and models to  describe Stable Angina and Peripheral Artery. Includes description of the disease process and treatment options available to the cardiac patient.   Exercise & Equipment Safety: - Individual verbal instruction and demonstration of equipment use and safety with use of the equipment.   Cardiac Rehab from 11/22/2019 in [facility] Cardiac and Pulmonary Rehab  Date  11/22/19  Educator  AS  Instruction Review Code  1- Verbalizes Understanding      Infection Prevention: - Provides verbal and written material to individual with discussion of infection control including proper hand washing and proper equipment cleaning during exercise session.   Cardiac Rehab from 11/22/2019 in [facility] Cardiac and Pulmonary Rehab  Date  11/22/19  Educator  AS  Instruction Review Code  1- Verbalizes Understanding      Falls Prevention: - Provides verbal and written material to individual with discussion of falls prevention and safety.   Cardiac Rehab from 11/22/2019 in [facility] Cardiac and Pulmonary Rehab  Date  11/22/19  Educator  AS  Instruction Review Code  1- Verbalizes Understanding      Diabetes: - Individual verbal and written instruction to review signs/symptoms of diabetes, desired ranges of glucose level fasting, after meals and with exercise. Acknowledge that pre and post exercise glucose checks will be done for 3 sessions at entry of program.   Know Your Numbers and Risk Factors: -Group verbal and written instruction about important numbers in your health.  Discussion of what are risk factors and how they play a role in the disease process.  Review of Cholesterol, Blood Pressure, Diabetes, and BMI and the role they play in your overall health.   Sleep Hygiene: -Provides group verbal and written instruction about how sleep can affect your health.  Define sleep hygiene, discuss sleep cycles and impact of sleep habits. Review good sleep hygiene tips.  Other: -Provides group and verbal instruction on  various topics (see comments)   Knowledge Questionnaire Score:   Core Components/Risk Factors/Patient Goals at Admission: Personal Goals and Risk Factors at Admission - 11/22/19 1318      Core Components/Risk Factors/Patient Goals on Admission    Weight Management  Yes    Intervention  Weight Management: Develop a combined nutrition and exercise program designed to reach desired caloric intake, while maintaining appropriate intake of nutrient and fiber, sodium and fats, and appropriate energy expenditure required for the weight goal.;Weight Management/Obesity: Establish reasonable short term and long term weight goals.;Weight Management: Provide education and appropriate resources to help participant work on and attain dietary goals.    Admit Weight  198 lb 14.4 oz (90.2 kg)    Expected Outcomes  Short Term: Continue to assess and modify interventions until short term weight is achieved;Weight Maintenance: Understanding of the daily nutrition guidelines, which includes 25-35% calories from fat, 7% or less cal from saturated fats, less than 259m cholesterol, less than 1.5gm of sodium, & 5 or more servings of fruits and vegetables daily;Long Term: Adherence to nutrition and physical activity/exercise program aimed toward attainment of established weight goal;Weight Loss: Understanding of general recommendations for a balanced deficit meal plan, which promotes 1-2 lb weight loss per week and includes a negative energy balance of [phone] kcal/d;Understanding recommendations for meals to include 15-35% energy as protein, 25-35% energy from fat, 35-60% energy from carbohydrates, less than 2054mof dietary cholesterol, 20-35 gm of total fiber daily    Hypertension  Yes    Intervention  Provide education on lifestyle modifcations including regular physical activity/exercise, weight management, moderate sodium restriction and increased consumption of fresh fruit, vegetables, and low fat dairy, alcohol  moderation, and smoking cessation.;Monitor prescription use compliance.    Expected Outcomes  Short Term: Continued assessment and intervention until BP is < 140/9035mG in hypertensive participants. < 130/63m74m in hypertensive participants with diabetes, heart failure or chronic kidney disease.;Long Term: Maintenance of blood pressure at goal levels.    Intervention  Provide education and support for participant on nutrition & aerobic/resistive exercise along with prescribed medications to achieve LDL <70mg31mL >40mg.42mExpected Outcomes  Short Term: Participant states understanding of desired cholesterol values and is compliant with medications prescribed. Participant is following exercise prescription and nutrition guidelines.;Long Term: Cholesterol controlled with medications as prescribed, with individualized exercise RX and with personalized nutrition plan. Value goals: LDL < 70mg, 55m> 40 mg.       Core Components/Risk Factors/Patient Goals Review:    Core Components/Risk Factors/Patient Goals at Discharge (Final Review):    ITP Comments: ITP Comments    Row Name 11/21/19 1324           ITP Comments  Virtual Orientation performed. Patient informed when to come in for RD and EP orientation. Diagnosis can be found in Care Everywhere 11/10/2019.          Comments: initial ITP

## 2019-11-22 NOTE — Progress Notes (Signed)
Office Visit    Patient Name: Connor Tyler Date of Encounter: 11/27/2019  Primary Care Provider:  Lynnea Ferrier, MD Primary Cardiologist:  Lorine Bears, MD  Chief Complaint    Chief Complaint  Patient presents with  . office visit    2 week F/U-Patient would like to discuss getting cataract surgery; Meds verbally reviewed with patient.    74 year old male with history of CAD with NSTEMI 11/10/2019 s/p PCI/DES x2 to RCA, moderate regurgitation due to mitral valve prolapse, HTN, HLD, palpitations, and past history of smoking, and who presents today for 2 week follow-up s/p NSTEMI and for discussion of possible upcoming cataract surgery given recent NSTEMI.  Past Medical History    Past Medical History:  Diagnosis Date  . Allergic rhinitis   . Anemia   . Asthma   . Coronary artery disease 11/11   Diffuse mild 3 vessell CAD by cardiac cath @ the [facility]  . ED (erectile dysfunction)   . GERD (gastroesophageal reflux disease)   . Heart murmur   . History of MRSA infection   . Hypertension   . Mitral regurgitation   . Valvular heart disease    Past Surgical History:  Procedure Laterality Date  . ANAL FISSURE REPAIR    . CARDIAC CATHETERIZATION     [facility]  . CATARACT EXTRACTION W/PHACO Left 01/25/2018   Procedure: CATARACT EXTRACTION PHACO AND INTRAOCULAR LENS PLACEMENT (IOC);  Surgeon: Galen Manila, MD;  Location: [facility] ORS;  Service: Ophthalmology;  Laterality: Left;  [place] 00:49 AP% 13.3 CDE 6.60 Fluid pack lot # [id number]  . COLONOSCOPY    . COLONOSCOPY WITH PROPOFOL N/A 09/14/2018   Procedure: COLONOSCOPY WITH PROPOFOL;  Surgeon: Scot Jun, MD;  Location: [facility] ENDOSCOPY;  Service: Endoscopy;  Laterality: N/A;  . ESOPHAGOGASTRODUODENOSCOPY (EGD) WITH PROPOFOL N/A 09/14/2018   Procedure: ESOPHAGOGASTRODUODENOSCOPY (EGD) WITH PROPOFOL;  Surgeon: Scot Jun, MD;  Location: [facility] ENDOSCOPY;  Service: Endoscopy;  Laterality: N/A;  IV  ZOSYN  . TONSILLECTOMY    . wisdom teeth resected      Allergies  No Known Allergies  History of Present Illness    Connor Tyler is a 74 y.o. male with PMH as above. He has a history of moderate regurgitation due to mitral valve prolapse.  He was evaluated in 2011 at the [facility] clinic for his mitral regurgitation.  It was concluded that the mitral regurgitation was moderate not severe and thus surgery was not indicated.  Cardiac catheterization showed mild three-vessel CAD without revascularization indicated he also has a history of hypertension, hyperlipidemia, and previous history of smoking.  He was seen in the office for palpitations, fatigue, and shortness of breath.  He subsequently underwent Lexiscan Myoview that showed no evidence of ischemia and normal ejection fraction echocardiogram showed normal LV systolic function and mild mitral regurgitation.  Holter monitor was done by his PCP and showed PVCs with short runs of SVT.  He subsequently cut down on caffeine intake with resolution of symptoms.  He was last seen in clinic by his primary cardiologist 05/12/2019, at which time he stated that he was feeling well.  His blood pressure was reasonably well controlled.  He was continued on a statin. On November 10, 2019, he suffered an NSTEMI at [facility].  He reported angina with associated shortness of breath and fatigue, as well as diaphoresis while playing golf.  Troponin was 0.050 ng/mL. Coronary angiography showed severe preocclusive disease in the  dominant RCA and obtuse marginal branches.  He received PCI with DES with intracoronary ultrasound stent deployment guidance.  LVSF was normal by ventriculography and echocardiography. When seen at follow-up in the office 11/13/19, he denied any recurrence of chest pain.  He occasionally felt short of breath at rest.  He has not yet started (since enrolled and started).  He reported medication compliance.  His medications were changed to  those listed below as per GDMT.  His BP was mildly elevated at 140/78 and he was mildly bradycardic with bisoprolol changed to Coreg and amlodipine escalated. He was continued on his ACE. His ASA 81mg  was reduced down to once daily dosing and he ws continued on DAPT with Brilinta. No signs or symptoms of bleeding.  Lovastatin was changed to atorvastatin. He occasionally took his blood pressure at home with recommendation to start twice daily checks with patient agreeable.  Today, he reports he is doing very well. No CP or need for SL nitro. No racing HR or palpitations. He reports increasing energy and successful start of cardiac rehab. No presyncope or syncope. He is tolerating all medications and medication changes well (refer to 11/13/19 visit documentation) as detailed at time of previous visit. He does note loose stools in comparison to his usual stools but no diarrhea. He will continue to monitor and notify 01/13/20 if he experiences ongoing diarrhea, given risk of electrolyte abnormality with ongoing diarrhea. No melena, hematochezia, or hematuria.  No other s/sx of bleeding, including hemoptysis. No reported orthopnea, abdominal distention, PND, SOB/DOE, or early satiety. He reports recent weight loss and increased energy with ongoing lifestyle modifications. He avoids salt and alcohol with 1 small glass of wine reported recently but otherwise no alcohol since his cardiac event. He has been logging his BP since his last visit with SBP 119-130s and higher Bps noted in the afternoon. On review of medications, he has been taking his Coreg once daily and agreeable to trial twice daily dosing. He will let us know if he does not tolerate twice daily dosing, at which time we can consider Coreg 3.125mg  in the AM and Coreg 6.25mg  in the PM if better tolerated. We will reassess at RTC as below. He denies presyncope or concerning sx with lower SBP in the 110s. On review of logs, he has had less bradycardia with transition of  BB to Coreg. He will continue to monitor his BP and get daily weights. Discussed medications today and reviewed the indication and dosing for each. Overall, he is very satisfied with his health and lifestyle changes and feels he is doing well from a cardiac standpoint.  Home Medications    Prior to Admission medications   Medication Sig Start Date End Date Taking? Authorizing Provider  albuterol (PROAIR HFA) 108 (90 BASE) MCG/ACT inhaler Inhale 2 puffs into the lungs every 4 (four) hours as needed for wheezing or shortness of breath.     [provider]  amLODipine (NORVASC) 10 MG tablet Take 1 tablet (10 mg total) by mouth daily. 11/13/19 02/11/20  04/12/20 D, PA-C  aspirin EC 81 MG tablet Take 81 mg by mouth daily.     [provider]  atorvastatin (LIPITOR) 80 MG tablet Take 1 tablet (80 mg total) by mouth daily. 11/13/19 02/11/20  04/12/20 D, PA-C  carvedilol (COREG) 6.25 MG tablet Take 1 tablet (6.25 mg total) by mouth 2 (two) times daily. 11/13/19   01/13/20 D, PA-C  cetirizine (ZYRTEC) 10 MG tablet Take  10 mg by mouth daily.     [provider]  Coenzyme Q10 10 MG capsule Take 10 mg by mouth daily.     [provider]  lisinopril (PRINIVIL,ZESTRIL) 40 MG tablet TAKE 1 TABLET ONE TIME DAILY 07/05/15   [provider]  Melatonin 5 MG CAPS Take 10 mg by mouth at bedtime.     [provider]  Multiple Vitamins-Minerals (ZINC PO) Take by mouth. Taking 1 capsule daily    [provider]  nitroGLYCERIN (NITROSTAT) 0.4 MG SL tablet Place 1 tablet (0.4 mg total) under the tongue every 5 (five) minutes as needed for chest pain. 11/13/19 02/11/20  Marisue Ivan D, PA-C  pantoprazole (PROTONIX) 40 MG tablet Take 1 tablet (40 mg total) by mouth daily. 11/13/19   Marisue Ivan D, PA-C  phenylephrine-shark liver oil-mineral oil-petrolatum (PREPARATION H) 0.25-3-14-71.9 % rectal ointment Place 1 application rectally 2 (two)  times daily as needed for hemorrhoids.    [provider]  ticagrelor (BRILINTA) 90 MG TABS tablet Take 1 tablet (90 mg total) by mouth 2 (two) times daily. 11/14/19   Marisue Ivan D, PA-C  vitamin B-12 (CYANOCOBALAMIN) 500 MCG tablet Take 500 mcg by mouth daily.    [provider]  vitamin C (ASCORBIC ACID) 500 MG tablet Take 500 mg by mouth daily.    [provider]    Review of Systems    He denies chest pain, palpitations, dyspnea, pnd, orthopnea, n, v, dizziness, syncope, edema, weight gain, or early satiety. He reports increased energy and weight loss.   All other systems reviewed and are otherwise negative except as noted above.  Physical Exam    VS:  BP 130/70 (BP Location: Left Arm, Patient Position: Sitting, Cuff Size: Normal)   Pulse 62   Ht 5\' 8"  (1.727 m)   Wt 197 lb 4 oz (89.5 kg)   SpO2 99%   BMI 29.99 kg/m  , BMI Body mass index is 29.99 kg/m. GEN: Well nourished, well developed, in no acute distress. Joined by his wife. HEENT: normal. Neck: Supple, no JVD, carotid bruits, or masses. Cardiac: RRR, 3/6 holosystolic murmur best heard at the apex but with radiation to the axilla. No rubs, or gallops. No clubbing, cyanosis. Trace to bilateral LEE and noted as relatively unchanged from previous exam.  Radials/DP/PT 2+ and equal bilaterally.  Respiratory:  Respirations regular and unlabored, clear to auscultation bilaterally. GI: Soft, nontender, nondistended, BS + x 4. MS: no deformity or atrophy. Skin: warm and dry, no rash. Neuro:  Strength and sensation are intact. Psych: Normal affect. Increased energy.  Accessory Clinical Findings    ECG personally reviewed by me today - 62bpm, PRi 180, IVCD with QRS , NSR with sinus arrhythmia and artifact, nonspecific ST/T wave abnormality, no acute changes from previous  - no acute changes.  VITALS Reviewed today   Temp Readings from Last 3 Encounters:  05/12/19 98.4 F (36.9 C)  02/24/19  98.6 F (37 C) (Temporal)  09/14/18 (!) 97.2 F (36.2 C) (Tympanic)   BP Readings from Last 3 Encounters:  11/27/19 130/70  11/13/19 140/78  05/12/19 140/70   Pulse Readings from Last 3 Encounters:  11/27/19 62  11/13/19 (!) 56  05/12/19 (!) 55    Wt Readings from Last 3 Encounters:  11/27/19 197 lb 4 oz (89.5 kg)  11/22/19 198 lb 14.4 oz (90.2 kg)  11/13/19 202 lb (91.6 kg)     LABS  reviewed today   CareEverwhere  Labs present? Yes/No: Yes    Lab Results  Component Value Date   WBC 5.2 11/13/2019   HGB 14.0 11/13/2019   HCT 39.5 11/13/2019   MCV 89 11/13/2019   PLT 215 11/13/2019   Lab Results  Component Value Date   CREATININE 0.91 11/13/2019   BUN 18 11/13/2019   NA 143 11/13/2019   K 4.4 11/13/2019   CL 105 11/13/2019   CO2 21 11/13/2019   No results found for: ALT, AST, GGT, ALKPHOS, BILITOT No results found for: CHOL, HDL, LDLCALC, LDLDIRECT, TRIG, CHOLHDL  No results found for: HGBA1C No results found for: TSH   STUDIES/PROCEDURES reviewed today   Per CareEverywhere and CD / Media scanned: 11/2019 LHC with PCI to RCA - [facility] 11/2019 Echo with nl LVSF- [place] Coronary angiography was performed and revealed severe preocclusive disease in the dominant RCA and obtuse marginal branches.  He received PCI with DES with intracoronary ultrasound stent deployment guidance.  LVSF was normal by ventriculography and echocardiography. CD was provided for our office with the catheterization and echo images and placed in our EKG box for scanning (still in box on check this AM 3/22 and needs scanned into EMR still).   Echo 03/22/2019 1. The left ventricle has normal systolic function with an ejection  fraction of 60-65%. The cavity size was normal. There is mildly increased  left ventricular wall thickness. Left ventricular diastolic Doppler  parameters are consistent with  pseudonormalization.  2. The right ventricle has normal systolic function. The  cavity was  normal. There is no increase in right ventricular wall thickness. Right  ventricular systolic pressure is mildly elevated with an estimated  pressure of 38.8 mmHg.  3. Left atrial size was mild-moderately dilated.  4. Aortic valve regurgitation is mild to moderate. Mitral valve  regurgitation is mild by color flow Doppler.   NM Lexiscan Study 01/20/2019  The study is normal.  This is a low risk study. The left ventricular ejection fraction is normal (55-65%).  Assessment & Plan    CAD s/p PCI DES x2 to RCA History of NSTEMI 11/10/19 --No recent angina or concerning sx. Significant improvement in energy and doing well in cardiac rehab. No CP and has not needed his SL nitro. No SOB at rest and he has not needed his inhaler. No s/sx of bleeding on DAPT for at least 1 year with ASA 81mg  qd and Brilinta 90mg  BID. He is tolerating the PPI / protonix 40mg  qd well. At previous visit, bisoprolol changed to Coreg for BP support. He has been taking this only once daily as outlined below. Increase Coreg to twice daily dosing with room in BP (130/70) today and monitor HR at home given HR 62bpm today. If he does not tolerate twice daily dosing, he will call the office with his sx. Continue lisinopril and amlodipine. He is euvolemic on exam and no indication for start of diuretic at this time but will continue to reassess at RTC and he will monitor home weights and call the office if any concerning sx. Continue recently changed stain therapy (started on atorvastatin 80mg  3/8) with recommendation to recheck lipids/LFTs at RTC given started on high intensity statin at last office visit. Recommendations as below if repeat labs show LDL still not at goal. Continue PRN SL nitro for CP. Ensure adequate refills. Consider repeat echo in ~6 months to reassess / monitor EF and valvular dz. Continue cardiac rehab as scheduled. Continue with lifestyle changes as noted  in HPI.   HTN --BP relatively well  controlled today with goal BP less than or equal to 130/80 and home SBP 119-130s. No report of dizziness if SBP 110s at home. He was taking his Coreg once daily (in the evenings) and will start twice daily dosing and call the office if not tolerated. Continue ACE and recently increased dose amlodipine for additional BP support. Continue to monitor BP at home. Continue to monitor fluids and keep under 2L. Reduce / eliminate salt / alcohol in diet and continue to escalate activity as tolerated.   MVP --No s/sx of worsening valvular dz. No further palpitations. No presyncope or syncope with MVP. No SOB. Tolerating cardiac rehab well. Recommend ongoing HR and BP control with periodic echos to monitor and repeat echo in ~6 months as above to monitor pulmonary pressures, EF, and valvular dz.   HLD --Continue statin therapy, which was recently changed to atorvastatin 80mg  daily. Repeat lipid and liver function in 6-8 weeks or at RTC. LDL goal below 70. If LDL still not at goal at recheck, consider addition of Zetia +/- PCSK9i or referral to lipid clinic if needed. Continue dietary and lifestyle changes.   Preoperative evaluation doumentation for possible cataract surgery --He may undergo cardiac surgery but is still currently deciding if he wishes to proceed. Cataract extractions are recognized in guidelines as low risk surgeries that do not typically require specific preoperative testing or holding of blood thinner therapy. Based on visit today, functional capacity 4-10 METS s/p revascularization for known CAD with PCI. Based on ACC/AHA guidelines, he would be at acceptable risk for the planned procedure without further cardiovascular testing.     Medication management --Request for consolidation of medications / less frequent dosing. Reports that medications now less overwhelming with AM/PM medication box; however, per wife, simplified medication regimien would still be preferred. Reviewed need for DAPT for  at least 1 year and twice daily dosing of Brilinta. Discussed SL nitro and refills today. On review of medications and doses per day, discovered Coreg has been taken once daily with recommendation to increase to twice daily dosing with patient agreement. If Coreg 6.25mg  BID poorly tolerated, will try Coreg 3.125mg  BID and escalate as tolerated at future visits. Reassess HR/BP at home and at RTC.    Medication changes: Coreg 6.25mg  BID. If twice daily dosing poorly tolerated, will try Coreg 3.125mg  BID and escalate as tolerated at future. BID dosing recommended. Labs ordered: None. Lipid and LFTs at RTC (~6-8 weeks of recent change to high intensity statin on 11/13/19) with recent LDLc 80 and not at goal <70. Consider Zetia/PCKS9i/lipid clinic if needed based on labs. Studies / Imaging ordered: None. Consider repeat echo in 6 months to monitor valvular dz.  Future considerations: Lipids and LFTs, repeat / periodic echo  Disposition: RTC 1 month or sooner if needed   01/13/20, PA-C 11/27/2019

## 2019-11-23 ENCOUNTER — Encounter: Payer: Medicare Other | Admitting: *Deleted

## 2019-11-23 ENCOUNTER — Other Ambulatory Visit: Payer: Self-pay

## 2019-11-23 DIAGNOSIS — I214 Non-ST elevation (NSTEMI) myocardial infarction: Secondary | ICD-10-CM

## 2019-11-23 DIAGNOSIS — Z955 Presence of coronary angioplasty implant and graft: Secondary | ICD-10-CM

## 2019-11-23 NOTE — Progress Notes (Signed)
Daily Session Note  Patient Details  Name: Connor Tyler MRN: [id number] Date of Birth: 10-Dec-1945 Referring Provider:     Cardiac Rehab from 11/22/2019 in [facility] Cardiac and Pulmonary Rehab  Referring Provider  Fletcher Anon      Encounter Date: 11/23/2019  Check In: Session Check In - 11/23/19 1156      Check-In   Supervising physician immediately available to respond to emergencies  See telemetry face sheet for immediately available ER MD    Location  [facility]-Cardiac & Pulmonary Rehab    Staff Present  Renita Papa, RN BSN;Mary Kellie Shropshire, RN, BSN, [place], BS, RRT, CPFT;Amanda Oletta Darter, BA, ACSM CEP, Exercise Physiologist    Virtual Visit  No    Medication changes reported      No    Fall or balance concerns reported     No    Warm-up and Cool-down  Performed on first and last piece of equipment    Resistance Training Performed  Yes    VAD Patient?  No    PAD/SET Patient?  No      Pain Assessment   Currently in Pain?  No/denies          Social History   Tobacco Use  Smoking Status Former Smoker  . Packs/day: 1.00  . Years: 13.00  . Pack years: 13.00  . Types: Cigarettes  . Quit date: 17  . Years since quitting: 49.2  Smokeless Tobacco Never Used    Goals Met:  Independence with exercise equipment Exercise tolerated well No report of cardiac concerns or symptoms Strength training completed today  Goals Unmet:  Not Applicable  Comments: First full day of exercise!  Patient was oriented to gym and equipment including functions, settings, policies, and procedures.  Patient's individual exercise prescription and treatment plan were reviewed.  All starting workloads were established based on the results of the 6 minute walk test done at initial orientation visit.  The plan for exercise progression was also introduced and progression will be customized based on patient's performance and goals.'   Dr. Emily Filbert is Medical Director for [place] and [name].

## 2019-11-27 ENCOUNTER — Encounter: Payer: Self-pay | Admitting: Physician Assistant

## 2019-11-27 ENCOUNTER — Ambulatory Visit (INDEPENDENT_AMBULATORY_CARE_PROVIDER_SITE_OTHER): Payer: Medicare Other | Admitting: Physician Assistant

## 2019-11-27 ENCOUNTER — Other Ambulatory Visit: Payer: Self-pay

## 2019-11-27 VITALS — BP 130/70 | HR 62 | Ht 68.0 in | Wt 197.2 lb

## 2019-11-27 DIAGNOSIS — Z01810 Encounter for preprocedural cardiovascular examination: Secondary | ICD-10-CM

## 2019-11-27 DIAGNOSIS — E785 Hyperlipidemia, unspecified: Secondary | ICD-10-CM | POA: Diagnosis not present

## 2019-11-27 DIAGNOSIS — I2510 Atherosclerotic heart disease of native coronary artery without angina pectoris: Secondary | ICD-10-CM

## 2019-11-27 DIAGNOSIS — Z79899 Other long term (current) drug therapy: Secondary | ICD-10-CM

## 2019-11-27 DIAGNOSIS — I10 Essential (primary) hypertension: Secondary | ICD-10-CM

## 2019-11-27 DIAGNOSIS — I340 Nonrheumatic mitral (valve) insufficiency: Secondary | ICD-10-CM | POA: Diagnosis not present

## 2019-11-27 DIAGNOSIS — I341 Nonrheumatic mitral (valve) prolapse: Secondary | ICD-10-CM

## 2019-11-27 DIAGNOSIS — I252 Old myocardial infarction: Secondary | ICD-10-CM

## 2019-11-27 NOTE — Patient Instructions (Signed)
Medication Instructions:  - Your physician has recommended you make the following change in your medication:   1) Make sure you are taking your coreg (carvedilol) 6.25 mg - 1 tablet TWICE daily  *If you need a refill on your cardiac medications before your next appointment, please call your pharmacy*   Lab Work: - none ordered  If you have labs (blood work) drawn today and your tests are completely normal, you will receive your results only by: Marland Kitchen MyChart Message (if you have MyChart) OR . A paper copy in the mail If you have any lab test that is abnormal or we need to change your treatment, we will call you to review the results.   Testing/Procedures: - none ordered   Follow-Up: At [facility], you and your health needs are our priority.  As part of our continuing mission to provide you with exceptional heart care, we have created designated Provider Care Teams.  These Care Teams include your primary Cardiologist (physician) and Advanced Practice Providers (APPs -  Physician Assistants and Nurse Practitioners) who all work together to provide you with the care you need, when you need it.  We recommend signing up for the patient portal called "MyChart".  Sign up information is provided on this After Visit Summary.  MyChart is used to connect with patients for Virtual Visits (Telemedicine).  Patients are able to view lab/test results, encounter notes, upcoming appointments, etc.  Non-urgent messages can be sent to your provider as well.   To learn more about what you can do with MyChart, go to [email or web address].    Your next appointment:   1 month(s)  The format for your next appointment:   In Person  Provider:   Lorine Bears, MD (only)   Other Instructions - n/a

## 2019-11-28 ENCOUNTER — Encounter: Payer: Medicare Other | Admitting: *Deleted

## 2019-11-28 DIAGNOSIS — I214 Non-ST elevation (NSTEMI) myocardial infarction: Secondary | ICD-10-CM | POA: Diagnosis not present

## 2019-11-28 DIAGNOSIS — Z955 Presence of coronary angioplasty implant and graft: Secondary | ICD-10-CM

## 2019-11-28 NOTE — Progress Notes (Signed)
Daily Session Note  Patient Details  Name: Connor Tyler MRN: [id number] Date of Birth: 1945-12-12 Referring Provider:     Cardiac Rehab from 11/22/2019 in [facility] Cardiac and Pulmonary Rehab  Referring Provider  Connor Tyler      Encounter Date: 11/28/2019  Check In: Session Check In - 11/28/19 1122      Check-In   Supervising physician immediately available to respond to emergencies  See telemetry face sheet for immediately available ER MD    Location  [facility]-Cardiac & Pulmonary Rehab    Staff Present  Connor Papa, RN BSN;Connor Tyler, [place], ACSM CEP, Exercise Physiologist    Virtual Visit  No    Medication changes reported      No    Fall or balance concerns reported     No    Warm-up and Cool-down  Performed on first and last piece of equipment    Resistance Training Performed  Yes    VAD Patient?  No    PAD/SET Patient?  No      Pain Assessment   Currently in Pain?  No/denies          Social History   Tobacco Use  Smoking Status Former Smoker  . Packs/day: 1.00  . Years: 13.00  . Pack years: 13.00  . Types: Cigarettes  . Quit date: 17  . Years since quitting: 49.2  Smokeless Tobacco Never Used    Goals Met:  Independence with exercise equipment Exercise tolerated well No report of cardiac concerns or symptoms Strength training completed today  Goals Unmet:  Not Applicable  Comments: Pt able to follow exercise prescription today without complaint.  Will continue to monitor for progression.    Dr. Emily Tyler is Medical Director for [facility].

## 2019-11-29 ENCOUNTER — Encounter: Payer: Self-pay | Admitting: *Deleted

## 2019-11-29 DIAGNOSIS — Z955 Presence of coronary angioplasty implant and graft: Secondary | ICD-10-CM

## 2019-11-29 DIAGNOSIS — I214 Non-ST elevation (NSTEMI) myocardial infarction: Secondary | ICD-10-CM

## 2019-11-29 NOTE — Progress Notes (Signed)
Cardiac Individual Treatment Plan  Patient Details  Name: Connor Tyler MRN: [id number] Date of Birth: [birth year] Referring Provider:     Cardiac Rehab from 11/22/2019 in [facility] Cardiac and Pulmonary Rehab  Referring Provider  Arida      Initial Encounter Date:    Cardiac Rehab from 11/22/2019 in [facility] Cardiac and Pulmonary Rehab  Date  11/22/19      Visit Diagnosis: NSTEMI (non-ST elevation myocardial infarction) [facility])  Status post coronary artery stent placement  Patient's Home Medications on Admission:  Current Outpatient Medications:  .  albuterol (PROAIR HFA) 108 (90 BASE) MCG/ACT inhaler, Inhale 2 puffs into the lungs every 4 (four) hours as needed for wheezing or shortness of breath. , Disp: , Rfl:  .  amLODipine (NORVASC) 10 MG tablet, Take 1 tablet (10 mg total) by mouth daily., Disp: 30 tablet, Rfl: 6 .  aspirin EC 81 MG tablet, Take 81 mg by mouth daily. , Disp: , Rfl:  .  atorvastatin (LIPITOR) 80 MG tablet, Take 1 tablet (80 mg total) by mouth daily., Disp: 30 tablet, Rfl: 6 .  carvedilol (COREG) 6.25 MG tablet, Take 1 tablet (6.25 mg total) by mouth 2 (two) times daily., Disp: 60 tablet, Rfl: 6 .  cetirizine (ZYRTEC) 10 MG tablet, Take 10 mg by mouth daily. , Disp: , Rfl:  .  Coenzyme Q10 10 MG capsule, Take 10 mg by mouth daily. , Disp: , Rfl:  .  lisinopril (PRINIVIL,ZESTRIL) 40 MG tablet, TAKE 1 TABLET ONE TIME DAILY, Disp: , Rfl:  .  Melatonin 5 MG CAPS, Take 10 mg by mouth at bedtime. , Disp: , Rfl:  .  Multiple Vitamins-Minerals (ZINC PO), Take by mouth. Taking 1 capsule daily, Disp: , Rfl:  .  nitroGLYCERIN (NITROSTAT) 0.4 MG SL tablet, Place 1 tablet (0.4 mg total) under the tongue every 5 (five) minutes as needed for chest pain., Disp: 25 tablet, Rfl: 3 .  pantoprazole (PROTONIX) 40 MG tablet, Take 1 tablet (40 mg total) by mouth daily., Disp: 30 tablet, Rfl: 6 .  phenylephrine-shark liver oil-mineral oil-petrolatum (PREPARATION H) 0.25-3-14-71.9 % rectal  ointment, Place 1 application rectally 2 (two) times daily as needed for hemorrhoids., Disp: , Rfl:  .  ticagrelor (BRILINTA) 90 MG TABS tablet, Take 1 tablet (90 mg total) by mouth 2 (two) times daily., Disp: 60 tablet, Rfl: 6 .  vitamin B-12 (CYANOCOBALAMIN) 500 MCG tablet, Take 500 mcg by mouth daily., Disp: , Rfl:  .  vitamin C (ASCORBIC ACID) 500 MG tablet, Take 500 mg by mouth daily., Disp: , Rfl:   Past Medical History: Past Medical History:  Diagnosis Date  . Allergic rhinitis   . Anemia   . Asthma   . Coronary artery disease 11/11   Diffuse mild 3 vessell CAD by cardiac cath @ the [facility]  . ED (erectile dysfunction)   . GERD (gastroesophageal reflux disease)   . Heart murmur   . History of MRSA infection   . Hypertension   . Mitral regurgitation   . Valvular heart disease     Tobacco Use: Social History   Tobacco Use  Smoking Status Former Smoker  . Packs/day: 1.00  . Years: 13.00  . Pack years: 13.00  . Types: Cigarettes  . Quit date: 39  . Years since quitting: 49.2  Smokeless Tobacco Never Used    Labs: Recent Review Flowsheet Data    There is no flowsheet data to display.       Exercise  Target Goals: Exercise Program Goal: Individual exercise prescription set using results from initial 6 min walk test and THRR while considering  patient's activity barriers and safety.   Exercise Prescription Goal: Initial exercise prescription builds to 30-45 minutes a day of aerobic activity, 2-3 days per week.  Home exercise guidelines will be given to patient during program as part of exercise prescription that the participant will acknowledge.  Activity Barriers & Risk Stratification:   6 Minute Walk: 6 Minute Walk    Row Name 11/22/19 1311         6 Minute Walk   Phase  Initial     Distance  1698 feet     Walk Time  6 minutes     # of Rest Breaks  0     MPH  3.2     METS  3.33     RPE  9     Perceived Dyspnea   0     VO2 Peak  11.7      Symptoms  No     Resting HR  78 bpm     Resting BP  134/66     Resting Oxygen Saturation   96 %     Exercise Oxygen Saturation  during 6 min walk  98 %     Max Ex. HR  104 bpm     Max Ex. BP  122/56     2 Minute Post BP  116/60        Oxygen Initial Assessment:   Oxygen Re-Evaluation:   Oxygen Discharge (Final Oxygen Re-Evaluation):   Initial Exercise Prescription: Initial Exercise Prescription - 11/22/19 1300      Date of Initial Exercise RX and Referring Provider   Date  11/22/19    Referring Provider  Arida      Treadmill   MPH  3    Grade  0    Minutes  15    METs  3.33      Recumbant Bike   Level  3    RPM  60    Watts  38    Minutes  15    METs  3.33      Elliptical   Level  1    Speed  3    Minutes  15      REL-XR   Level  3    Speed  50    Minutes  15    METs  3.3      Prescription Details   Frequency (times per week)  3    Duration  Progress to 30 minutes of continuous aerobic without signs/symptoms of physical distress      Intensity   THRR 40-80% of Max Heartrate  106-134    Ratings of Perceived Exertion  11-15    Perceived Dyspnea  0-4      Resistance Training   Training Prescription  Yes    Weight  6 lb    Reps  10-15       Perform Capillary Blood Glucose checks as needed.  Exercise Prescription Changes: Exercise Prescription Changes    Row Name 11/22/19 1300             Response to Exercise   Blood Pressure (Admit)  134/66       Blood Pressure (Exercise)  122/56       Blood Pressure (Exit)  116/60       Heart Rate (Admit)  78 bpm  Heart Rate (Exercise)  104 bpm       Heart Rate (Exit)  86 bpm       Oxygen Saturation (Admit)  96 %       Oxygen Saturation (Exercise)  98 %       Rating of Perceived Exertion (Exercise)  9       Perceived Dyspnea (Exercise)  0       Symptoms  none          Exercise Comments:   Exercise Goals and Review: Exercise Goals    Row Name 11/22/19 1317             Exercise  Goals   Increase Physical Activity  Yes       Intervention  Provide advice, education, support and counseling about physical activity/exercise needs.;Develop an individualized exercise prescription for aerobic and resistive training based on initial evaluation findings, risk stratification, comorbidities and participant's personal goals.       Expected Outcomes  Short Term: Attend rehab on a regular basis to increase amount of physical activity.;Long Term: Add in home exercise to make exercise part of routine and to increase amount of physical activity.;Long Term: Exercising regularly at least 3-5 days a week.       Increase Strength and Stamina  Yes       Intervention  Provide advice, education, support and counseling about physical activity/exercise needs.;Develop an individualized exercise prescription for aerobic and resistive training based on initial evaluation findings, risk stratification, comorbidities and participant's personal goals.       Expected Outcomes  Short Term: Increase workloads from initial exercise prescription for resistance, speed, and METs.;Short Term: Perform resistance training exercises routinely during rehab and add in resistance training at home;Long Term: Improve cardiorespiratory fitness, muscular endurance and strength as measured by increased METs and functional capacity (6MWT)       Able to understand and use rate of perceived exertion (RPE) scale  Yes       Intervention  Provide education and explanation on how to use RPE scale       Expected Outcomes  Short Term: Able to use RPE daily in rehab to express subjective intensity level;Long Term:  Able to use RPE to guide intensity level when exercising independently       Able to understand and use Dyspnea scale  Yes       Intervention  Provide education and explanation on how to use Dyspnea scale       Expected Outcomes  Short Term: Able to use Dyspnea scale daily in rehab to express subjective sense of shortness of  breath during exertion;Long Term: Able to use Dyspnea scale to guide intensity level when exercising independently       Knowledge and understanding of Target Heart Rate Range (THRR)  Yes       Intervention  Provide education and explanation of THRR including how the numbers were predicted and where they are located for reference       Expected Outcomes  Short Term: Able to state/look up THRR;Short Term: Able to use daily as guideline for intensity in rehab;Long Term: Able to use THRR to govern intensity when exercising independently       Able to check pulse independently  Yes       Intervention  Provide education and demonstration on how to check pulse in carotid and radial arteries.;Review the importance of being able to check your own pulse for safety during independent exercise  Expected Outcomes  Short Term: Able to explain why pulse checking is important during independent exercise;Long Term: Able to check pulse independently and accurately       Understanding of Exercise Prescription  Yes       Intervention  Provide education, explanation, and written materials on patient's individual exercise prescription       Expected Outcomes  Short Term: Able to explain program exercise prescription;Long Term: Able to explain home exercise prescription to exercise independently          Exercise Goals Re-Evaluation : Exercise Goals Re-Evaluation    Row Name 11/23/19 1201             Exercise Goal Re-Evaluation   Exercise Goals Review  Increase Physical Activity;Able to understand and use rate of perceived exertion (RPE) scale;Knowledge and understanding of Target Heart Rate Range (THRR);Understanding of Exercise Prescription;Increase Strength and Stamina;Able to check pulse independently       Comments  Reviewed RPE scale, THR and program prescription with pt today.  Pt voiced understanding and was given a copy of goals to take home.       Expected Outcomes  Short: Use RPE daily to regulate  intensity. Long: Follow program prescription in THR.          Discharge Exercise Prescription (Final Exercise Prescription Changes): Exercise Prescription Changes - 11/22/19 1300      Response to Exercise   Blood Pressure (Admit)  134/66    Blood Pressure (Exercise)  122/56    Blood Pressure (Exit)  116/60    Heart Rate (Admit)  78 bpm    Heart Rate (Exercise)  104 bpm    Heart Rate (Exit)  86 bpm    Oxygen Saturation (Admit)  96 %    Oxygen Saturation (Exercise)  98 %    Rating of Perceived Exertion (Exercise)  9    Perceived Dyspnea (Exercise)  0    Symptoms  none       Nutrition:  Target Goals: Understanding of nutrition guidelines, daily intake of sodium '1500mg'$ , cholesterol '200mg'$ , calories 30% from fat and 7% or less from saturated fats, daily to have 5 or more servings of fruits and vegetables.  Biometrics: Pre Biometrics - 11/22/19 1317      Pre Biometrics   Height  5' 8.5" (1.74 m)    Weight  198 lb 14.4 oz (90.2 kg)    BMI (Calculated)  29.8    Single Leg Stand  30 seconds        Nutrition Therapy Plan and Nutrition Goals:   Nutrition Assessments: Nutrition Assessments - 11/22/19 1253      MEDFICTS Scores   Pre Score  100       Nutrition Goals Re-Evaluation:   Nutrition Goals Discharge (Final Nutrition Goals Re-Evaluation):   Psychosocial: Target Goals: Acknowledge presence or absence of significant depression and/or stress, maximize coping skills, provide positive support system. Participant is able to verbalize types and ability to use techniques and skills needed for reducing stress and depression.   Initial Review & Psychosocial Screening: Initial Psych Review & Screening - 11/21/19 1310      Initial Review   Current issues with  Current Sleep Concerns;Current Stress Concerns    Source of Stress Concerns  Financial    Comments  He has cut back on work from [employer] and his stress levels have been lower.      Family Dynamics   Good  Support System?  Yes    Comments  He can look to his wife and son.      Barriers   Psychosocial barriers to participate in program  There are no identifiable barriers or psychosocial needs.;The patient should benefit from training in stress management and relaxation.      Screening Interventions   Interventions  Encouraged to exercise;To provide support and resources with identified psychosocial needs;Provide feedback about the scores to participant    Expected Outcomes  Short Term goal: Utilizing psychosocial counselor, staff and physician to assist with identification of specific Stressors or current issues interfering with healing process. Setting desired goal for each stressor or current issue identified.;Long Term Goal: Stressors or current issues are controlled or eliminated.;Short Term goal: Identification and review with participant of any Quality of Life or Depression concerns found by scoring the questionnaire.;Long Term goal: The participant improves quality of Life and PHQ9 Scores as seen by post scores and/or verbalization of changes       Quality of Life Scores:  Quality of Life - 11/22/19 1321      Quality of Life   Select  Quality of Life      Quality of Life Scores   Health/Function Pre  27.2 %    Socioeconomic Pre  29.29 %    Psych/Spiritual Pre  29.14 %    Family Pre  29.38 %    GLOBAL Pre  28.32 %      Scores of 19 and below usually indicate a poorer quality of life in these areas.  A difference of  2-3 points is a clinically meaningful difference.  A difference of 2-3 points in the total score of the Quality of Life Index has been associated with significant improvement in overall quality of life, self-image, physical symptoms, and general health in studies assessing change in quality of life.  PHQ-9: Recent Review Flowsheet Data    Depression screen [facility] 2/9 11/22/2019   Decreased Interest 0   Down, Depressed, Hopeless 0   PHQ - 2 Score 0   Altered sleeping 0    Tired, decreased energy 1   Feeling bad or failure about yourself  0   Trouble concentrating 0   Moving slowly or fidgety/restless 0   Suicidal thoughts 0   PHQ-9 Score 1   Difficult doing work/chores Not difficult at all     Interpretation of Total Score  Total Score Depression Severity:  1-4 = Minimal depression, 5-9 = Mild depression, 10-14 = Moderate depression, 15-19 = Moderately severe depression, 20-27 = Severe depression   Psychosocial Evaluation and Intervention: Psychosocial Evaluation - 11/21/19 1312      Psychosocial Evaluation & Interventions   Interventions  Encouraged to exercise with the program and follow exercise prescription    Comments  He has cut back on work from [employer] and his stress levels have been lower.    Expected Outcomes  Short: Attend [facility] stress management education to decrease stress. Long: Maintain exercise Post HeartTrack to keep stress at a minimum.    Continue Psychosocial Services   Follow up required by staff       Psychosocial Re-Evaluation:   Psychosocial Discharge (Final Psychosocial Re-Evaluation):   Vocational Rehabilitation: Provide vocational rehab assistance to qualifying candidates.   Vocational Rehab Evaluation & Intervention:   Education: Education Goals: Education classes will be provided on a variety of topics geared toward better understanding of heart health and risk factor modification. Participant will state understanding/return demonstration of topics presented as noted by education test scores.  Learning Barriers/Preferences:  Learning Barriers/Preferences - 11/21/19 1313      Learning Barriers/Preferences   Learning Barriers  Sight    Learning Preferences  None       Education Topics:  AED/CPR: - Group verbal and written instruction with the use of models to demonstrate the basic use of the AED with the basic ABC's of resuscitation.   General Nutrition Guidelines/Fats and Fiber: -Group  instruction provided by verbal, written material, models and posters to present the general guidelines for heart healthy nutrition. Gives an explanation and review of dietary fats and fiber.   Controlling Sodium/Reading Food Labels: -Group verbal and written material supporting the discussion of sodium use in heart healthy nutrition. Review and explanation with models, verbal and written materials for utilization of the food label.   Exercise Physiology & General Exercise Guidelines: - Group verbal and written instruction with models to review the exercise physiology of the cardiovascular system and associated critical values. Provides general exercise guidelines with specific guidelines to those with heart or lung disease.    Aerobic Exercise & Resistance Training: - Gives group verbal and written instruction on the various components of exercise. Focuses on aerobic and resistive training programs and the benefits of this training and how to safely progress through these programs..   Flexibility, Balance, Mind/Body Relaxation: Provides group verbal/written instruction on the benefits of flexibility and balance training, including mind/body exercise modes such as yoga, pilates and tai chi.  Demonstration and skill practice provided.   Stress and Anxiety: - Provides group verbal and written instruction about the health risks of elevated stress and causes of high stress.  Discuss the correlation between heart/lung disease and anxiety and treatment options. Review healthy ways to manage with stress and anxiety.   Depression: - Provides group verbal and written instruction on the correlation between heart/lung disease and depressed mood, treatment options, and the stigmas associated with seeking treatment.   Anatomy & Physiology of the Heart: - Group verbal and written instruction and models provide basic cardiac anatomy and physiology, with the coronary electrical and arterial systems.  Review of Valvular disease and Heart Failure   Cardiac Procedures: - Group verbal and written instruction to review commonly prescribed medications for heart disease. Reviews the medication, class of the drug, and side effects. Includes the steps to properly store meds and maintain the prescription regimen. (beta blockers and nitrates)   Cardiac Medications I: - Group verbal and written instruction to review commonly prescribed medications for heart disease. Reviews the medication, class of the drug, and side effects. Includes the steps to properly store meds and maintain the prescription regimen.   Cardiac Medications II: -Group verbal and written instruction to review commonly prescribed medications for heart disease. Reviews the medication, class of the drug, and side effects. (all other drug classes)    Go Sex-Intimacy & Heart Disease, Get SMART - Goal Setting: - Group verbal and written instruction through game format to discuss heart disease and the return to sexual intimacy. Provides group verbal and written material to discuss and apply goal setting through the application of the S.M.A.R.T. Method.   Other Matters of the Heart: - Provides group verbal, written materials and models to describe Stable Angina and Peripheral Artery. Includes description of the disease process and treatment options available to the cardiac patient.   Exercise & Equipment Safety: - Individual verbal instruction and demonstration of equipment use and safety with use of the equipment.   Cardiac Rehab from 11/22/2019 in [facility] Cardiac and Pulmonary  Rehab  Date  11/22/19  Educator  AS  Instruction Review Code  1- Verbalizes Understanding      Infection Prevention: - Provides verbal and written material to individual with discussion of infection control including proper hand washing and proper equipment cleaning during exercise session.   Cardiac Rehab from 11/22/2019 in [facility] Cardiac and Pulmonary Rehab   Date  11/22/19  Educator  AS  Instruction Review Code  1- Verbalizes Understanding      Falls Prevention: - Provides verbal and written material to individual with discussion of falls prevention and safety.   Cardiac Rehab from 11/22/2019 in [facility] Cardiac and Pulmonary Rehab  Date  11/22/19  Educator  AS  Instruction Review Code  1- Verbalizes Understanding      Diabetes: - Individual verbal and written instruction to review signs/symptoms of diabetes, desired ranges of glucose level fasting, after meals and with exercise. Acknowledge that pre and post exercise glucose checks will be done for 3 sessions at entry of program.   Know Your Numbers and Risk Factors: -Group verbal and written instruction about important numbers in your health.  Discussion of what are risk factors and how they play a role in the disease process.  Review of Cholesterol, Blood Pressure, Diabetes, and BMI and the role they play in your overall health.   Sleep Hygiene: -Provides group verbal and written instruction about how sleep can affect your health.  Define sleep hygiene, discuss sleep cycles and impact of sleep habits. Review good sleep hygiene tips.    Other: -Provides group and verbal instruction on various topics (see comments)   Knowledge Questionnaire Score: Knowledge Questionnaire Score - 11/22/19 1321      Knowledge Questionnaire Score   Pre Score  24/28       Core Components/Risk Factors/Patient Goals at Admission: Personal Goals and Risk Factors at Admission - 11/22/19 1318      Core Components/Risk Factors/Patient Goals on Admission    Weight Management  Yes    Intervention  Weight Management: Develop a combined nutrition and exercise program designed to reach desired caloric intake, while maintaining appropriate intake of nutrient and fiber, sodium and fats, and appropriate energy expenditure required for the weight goal.;Weight Management/Obesity: Establish reasonable short term and  long term weight goals.;Weight Management: Provide education and appropriate resources to help participant work on and attain dietary goals.    Admit Weight  198 lb 14.4 oz (90.2 kg)    Expected Outcomes  Short Term: Continue to assess and modify interventions until short term weight is achieved;Weight Maintenance: Understanding of the daily nutrition guidelines, which includes 25-35% calories from fat, 7% or less cal from saturated fats, less than '200mg'$  cholesterol, less than 1.5gm of sodium, & 5 or more servings of fruits and vegetables daily;Long Term: Adherence to nutrition and physical activity/exercise program aimed toward attainment of established weight goal;Weight Loss: Understanding of general recommendations for a balanced deficit meal plan, which promotes 1-2 lb weight loss per week and includes a negative energy balance of [phone] kcal/d;Understanding recommendations for meals to include 15-35% energy as protein, 25-35% energy from fat, 35-60% energy from carbohydrates, less than '200mg'$  of dietary cholesterol, 20-35 gm of total fiber daily    Hypertension  Yes    Intervention  Provide education on lifestyle modifcations including regular physical activity/exercise, weight management, moderate sodium restriction and increased consumption of fresh fruit, vegetables, and low fat dairy, alcohol moderation, and smoking cessation.;Monitor prescription use compliance.    Expected Outcomes  Short  Term: Continued assessment and intervention until BP is < 140/69m HG in hypertensive participants. < 130/820mHG in hypertensive participants with diabetes, heart failure or chronic kidney disease.;Long Term: Maintenance of blood pressure at goal levels.    Intervention  Provide education and support for participant on nutrition & aerobic/resistive exercise along with prescribed medications to achieve LDL '70mg'$ , HDL >'40mg'$ .    Expected Outcomes  Short Term: Participant states understanding of desired cholesterol  values and is compliant with medications prescribed. Participant is following exercise prescription and nutrition guidelines.;Long Term: Cholesterol controlled with medications as prescribed, with individualized exercise RX and with personalized nutrition plan. Value goals: LDL < '70mg'$ , HDL > 40 mg.       Core Components/Risk Factors/Patient Goals Review:    Core Components/Risk Factors/Patient Goals at Discharge (Final Review):    ITP Comments: ITP Comments    Row Name 11/21/19 1324 11/23/19 1200 11/29/19 1051       ITP Comments  Virtual Orientation performed. Patient informed when to come in for RD and EP orientation. Diagnosis can be found in Care Everywhere 11/10/2019.  First full day of exercise!  Patient was oriented to gym and equipment including functions, settings, policies, and procedures.  Patient's individual exercise prescription and treatment plan were reviewed.  All starting workloads were established based on the results of the 6 minute walk test done at initial orientation visit.  The plan for exercise progression was also introduced and progression will be customized based on patient's performance and goals  30 day chart review completed. ITP sent to Dr M Zachery Dakinsedical Director, for review,changes as needed and signature.  New to program        Comments:

## 2019-11-30 ENCOUNTER — Encounter: Payer: Medicare Other | Admitting: *Deleted

## 2019-11-30 ENCOUNTER — Other Ambulatory Visit: Payer: Self-pay

## 2019-11-30 DIAGNOSIS — I214 Non-ST elevation (NSTEMI) myocardial infarction: Secondary | ICD-10-CM | POA: Diagnosis not present

## 2019-11-30 DIAGNOSIS — Z955 Presence of coronary angioplasty implant and graft: Secondary | ICD-10-CM

## 2019-11-30 NOTE — Progress Notes (Signed)
Daily Session Note  Patient Details  Name: Connor Tyler MRN: [id number] Date of Birth: 12/19/1945 Referring Provider:     Cardiac Rehab from 11/22/2019 in [facility] Cardiac and Pulmonary Rehab  Referring Provider  Fletcher Anon      Encounter Date: 11/30/2019  Check In:      Social History   Tobacco Use  Smoking Status Former Smoker  . Packs/day: 1.00  . Years: 13.00  . Pack years: 13.00  . Types: Cigarettes  . Quit date: 91  . Years since quitting: 49.2  Smokeless Tobacco Never Used    Goals Met:  Independence with exercise equipment Exercise tolerated well No report of cardiac concerns or symptoms Strength training completed today  Goals Unmet:  Not Applicable  Comments: Pt able to follow exercise prescription today without complaint.  Will continue to monitor for progression.    Dr. Emily Filbert is Medical Director for [place] and [name].

## 2019-12-04 ENCOUNTER — Encounter: Payer: Medicare Other | Admitting: *Deleted

## 2019-12-04 ENCOUNTER — Other Ambulatory Visit: Payer: Self-pay

## 2019-12-04 DIAGNOSIS — Z955 Presence of coronary angioplasty implant and graft: Secondary | ICD-10-CM

## 2019-12-04 DIAGNOSIS — I214 Non-ST elevation (NSTEMI) myocardial infarction: Secondary | ICD-10-CM

## 2019-12-04 NOTE — Progress Notes (Signed)
Daily Session Note  Patient Details  Name: CAPRICE WASKO MRN: [id number] Date of Birth: 1946-07-14 Referring Provider:     Cardiac Rehab from 11/22/2019 in [facility] Cardiac and Pulmonary Rehab  Referring Provider  Fletcher Anon      Encounter Date: 12/04/2019  Check In: Session Check In - 12/04/19 1123      Check-In   Supervising physician immediately available to respond to emergencies  See telemetry face sheet for immediately available ER MD    Location  [facility]-Cardiac & Pulmonary Rehab    Staff Present  Renita Papa, RN Moises Blood, BS, ACSM CEP, Exercise Physiologist;Joseph Tessie Fass RCP,RRT,BSRT    Virtual Visit  No    Medication changes reported      No    Fall or balance concerns reported     No    Warm-up and Cool-down  Performed on first and last piece of equipment    Resistance Training Performed  Yes    VAD Patient?  No    PAD/SET Patient?  No      Pain Assessment   Currently in Pain?  No/denies          Social History   Tobacco Use  Smoking Status Former Smoker  . Packs/day: 1.00  . Years: 13.00  . Pack years: 13.00  . Types: Cigarettes  . Quit date: 12  . Years since quitting: 49.2  Smokeless Tobacco Never Used    Goals Met:  Independence with exercise equipment Exercise tolerated well No report of cardiac concerns or symptoms Strength training completed today  Goals Unmet:  Not Applicable  Comments: Pt able to follow exercise prescription today without complaint.  Will continue to monitor for progression.    Dr. Emily Filbert is Medical Director for [place] and [name].

## 2019-12-06 ENCOUNTER — Other Ambulatory Visit: Payer: Self-pay

## 2019-12-06 ENCOUNTER — Encounter: Payer: Medicare Other | Admitting: *Deleted

## 2019-12-06 DIAGNOSIS — I214 Non-ST elevation (NSTEMI) myocardial infarction: Secondary | ICD-10-CM

## 2019-12-06 DIAGNOSIS — Z955 Presence of coronary angioplasty implant and graft: Secondary | ICD-10-CM

## 2019-12-06 NOTE — Progress Notes (Signed)
Daily Session Note  Patient Details  Name: Connor Tyler MRN: [id number] Date of Birth: Nov 12, 1945 Referring Provider:     Cardiac Rehab from 11/22/2019 in [facility] Cardiac and Pulmonary Rehab  Referring Provider  Fletcher Anon      Encounter Date: 12/06/2019  Check In: Session Check In - 12/06/19 1134      Check-In   Supervising physician immediately available to respond to emergencies  See telemetry face sheet for immediately available ER MD    Location  [facility]-Cardiac & Pulmonary Rehab    Staff Present  Renita Papa, RN BSN;Joseph Hood RCP,RRT,BSRT;Melissa [place] RDN, LDN    Virtual Visit  No    Medication changes reported      No    Fall or balance concerns reported     No    Warm-up and Cool-down  Performed on first and last piece of equipment    Resistance Training Performed  Yes    VAD Patient?  No    PAD/SET Patient?  No      Pain Assessment   Currently in Pain?  No/denies          Social History   Tobacco Use  Smoking Status Former Smoker  . Packs/day: 1.00  . Years: 13.00  . Pack years: 13.00  . Types: Cigarettes  . Quit date: 46  . Years since quitting: 49.2  Smokeless Tobacco Never Used    Goals Met:  Independence with exercise equipment Exercise tolerated well No report of cardiac concerns or symptoms Strength training completed today  Goals Unmet:  Not Applicable  Comments: Pt able to follow exercise prescription today without complaint.  Will continue to monitor for progression.    Dr. Emily Filbert is Medical Director for [place] and [name].

## 2019-12-08 ENCOUNTER — Other Ambulatory Visit: Payer: Self-pay

## 2019-12-08 ENCOUNTER — Encounter: Payer: Medicare Other | Attending: Cardiovascular Disease | Admitting: *Deleted

## 2019-12-08 DIAGNOSIS — I214 Non-ST elevation (NSTEMI) myocardial infarction: Secondary | ICD-10-CM | POA: Diagnosis present

## 2019-12-08 DIAGNOSIS — Z955 Presence of coronary angioplasty implant and graft: Secondary | ICD-10-CM | POA: Diagnosis not present

## 2019-12-08 NOTE — Progress Notes (Signed)
Daily Session Note  Patient Details  Name: Connor Tyler MRN: [id number] Date of Birth: Oct 21, 1945 Referring Provider:     Cardiac Rehab from 11/22/2019 in [facility] Cardiac and Pulmonary Rehab  Referring Provider  Fletcher Anon      Encounter Date: 12/08/2019  Check In: Session Check In - 12/08/19 1259      Check-In   Supervising physician immediately available to respond to emergencies  See telemetry face sheet for immediately available ER MD    Location  [facility]-Cardiac & Pulmonary Rehab    Staff Present  Renita Papa, RN BSN;Joseph Hood RCP,RRT,BSRT;Heath Lark, RN, BSN, CCRP    Virtual Visit  No    Medication changes reported      No    Fall or balance concerns reported     No    Warm-up and Cool-down  Performed on first and last piece of equipment    Resistance Training Performed  Yes    VAD Patient?  No    PAD/SET Patient?  No      Pain Assessment   Currently in Pain?  No/denies          Social History   Tobacco Use  Smoking Status Former Smoker  . Packs/day: 1.00  . Years: 13.00  . Pack years: 13.00  . Types: Cigarettes  . Quit date: 27  . Years since quitting: 49.2  Smokeless Tobacco Never Used    Goals Met:  Independence with exercise equipment Exercise tolerated well No report of cardiac concerns or symptoms Strength training completed today  Goals Unmet:  Not Applicable  Comments: Pt able to follow exercise prescription today without complaint.  Will continue to monitor for progression.    Dr. Emily Filbert is Medical Director for [place] and [name].

## 2019-12-11 ENCOUNTER — Encounter: Payer: Medicare Other | Admitting: *Deleted

## 2019-12-11 ENCOUNTER — Other Ambulatory Visit: Payer: Self-pay

## 2019-12-11 DIAGNOSIS — Z955 Presence of coronary angioplasty implant and graft: Secondary | ICD-10-CM

## 2019-12-11 DIAGNOSIS — I214 Non-ST elevation (NSTEMI) myocardial infarction: Secondary | ICD-10-CM | POA: Diagnosis not present

## 2019-12-11 NOTE — Progress Notes (Signed)
Daily Session Note  Patient Details  Name: Connor Tyler MRN: [id number] Date of Birth: 04/26/1946 Referring Provider:     Cardiac Rehab from 11/22/2019 in [facility] Cardiac and Pulmonary Rehab  Referring Provider  Fletcher Anon      Encounter Date: 12/11/2019  Check In: Session Check In - 12/11/19 1117      Check-In   Supervising physician immediately available to respond to emergencies  See telemetry face sheet for immediately available ER MD    Location  [facility]-Cardiac & Pulmonary Rehab    Staff Present  Renita Papa, RN BSN;Joseph [address] [place], [place], ACSM CEP, Exercise Physiologist    Virtual Visit  No    Medication changes reported      No    Fall or balance concerns reported     No    Warm-up and Cool-down  Performed on first and last piece of equipment    Resistance Training Performed  Yes    VAD Patient?  No    PAD/SET Patient?  No      Pain Assessment   Currently in Pain?  No/denies          Social History   Tobacco Use  Smoking Status Former Smoker  . Packs/day: 1.00  . Years: 13.00  . Pack years: 13.00  . Types: Cigarettes  . Quit date: 63  . Years since quitting: 49.2  Smokeless Tobacco Never Used    Goals Met:  Independence with exercise equipment Exercise tolerated well No report of cardiac concerns or symptoms Strength training completed today  Goals Unmet:  Not Applicable  Comments: Pt able to follow exercise prescription today without complaint.  Will continue to monitor for progression.    Dr. Emily Filbert is Medical Director for [place] and [name].

## 2019-12-13 ENCOUNTER — Other Ambulatory Visit: Payer: Self-pay

## 2019-12-13 ENCOUNTER — Encounter: Payer: Medicare Other | Admitting: *Deleted

## 2019-12-13 DIAGNOSIS — I214 Non-ST elevation (NSTEMI) myocardial infarction: Secondary | ICD-10-CM | POA: Diagnosis not present

## 2019-12-13 NOTE — Progress Notes (Signed)
Daily Session Note  Patient Details  Name: Connor Tyler MRN: [id number] Date of Birth: 29-Sep-1945 Referring Provider:     Cardiac Rehab from 11/22/2019 in [facility] Cardiac and Pulmonary Rehab  Referring Provider  Fletcher Anon      Encounter Date: 12/13/2019  Check In: Session Check In - 12/13/19 0834      Check-In   Supervising physician immediately available to respond to emergencies  See telemetry face sheet for immediately available ER MD    Location  [facility]-Cardiac & Pulmonary Rehab    Staff Present  Nyoka Cowden, RN, BSN, MA;Susanne Bice, RN, BSN, CCRP;Joseph Hood RCP,RRT,BSRT    Medication changes reported      No    Fall or balance concerns reported     No    Warm-up and Cool-down  Performed on first and last piece of equipment    PAD/SET Patient?  No      Pain Assessment   Currently in Pain?  No/denies          Social History   Tobacco Use  Smoking Status Former Smoker  . Packs/day: 1.00  . Years: 13.00  . Pack years: 13.00  . Types: Cigarettes  . Quit date: 26  . Years since quitting: 49.2  Smokeless Tobacco Never Used    Goals Met:  Independence with exercise equipment Exercise tolerated well No report of cardiac concerns or symptoms  Goals Unmet:  Not Applicable  Comments: *Pt able to follow exercise prescription today without complaint.  Will continue to monitor for progression.   Dr. Emily Filbert is Medical Director for [name] and [name].

## 2019-12-15 ENCOUNTER — Encounter: Payer: Medicare Other | Admitting: *Deleted

## 2019-12-15 ENCOUNTER — Other Ambulatory Visit: Payer: Self-pay

## 2019-12-15 DIAGNOSIS — I214 Non-ST elevation (NSTEMI) myocardial infarction: Secondary | ICD-10-CM

## 2019-12-15 DIAGNOSIS — Z955 Presence of coronary angioplasty implant and graft: Secondary | ICD-10-CM

## 2019-12-15 NOTE — Progress Notes (Signed)
Daily Session Note  Patient Details  Name: Connor Tyler MRN: [id number] Date of Birth: 11-Jul-1946 Referring Provider:     Cardiac Rehab from 11/22/2019 in [facility] Cardiac and Pulmonary Rehab  Referring Provider  Fletcher Anon      Encounter Date: 12/15/2019  Check In: Session Check In - 12/15/19 0829      Check-In   Supervising physician immediately available to respond to emergencies  See telemetry face sheet for immediately available ER MD    Location  [facility]-Cardiac & Pulmonary Rehab    Staff Present  Nada Maclachlan, BA, ACSM CEP, Exercise Physiologist;Robertha Staples, RN, BSN, CCRP;Joseph Hood RCP,RRT,BSRT    Virtual Visit  No    Medication changes reported      No    Fall or balance concerns reported     No    Warm-up and Cool-down  Performed on first and last piece of equipment    Resistance Training Performed  Yes    VAD Patient?  No    PAD/SET Patient?  No      Pain Assessment   Currently in Pain?  No/denies          Social History   Tobacco Use  Smoking Status Former Smoker  . Packs/day: 1.00  . Years: 13.00  . Pack years: 13.00  . Types: Cigarettes  . Quit date: 92  . Years since quitting: 49.3  Smokeless Tobacco Never Used    Goals Met:  Independence with exercise equipment Exercise tolerated well No report of cardiac concerns or symptoms  Goals Unmet:  Not Applicable  Comments: Pt able to follow exercise prescription today without complaint.  Will continue to monitor for progression.    Dr. Emily Filbert is Medical Director for [place] and [name].

## 2019-12-18 ENCOUNTER — Other Ambulatory Visit: Payer: Self-pay

## 2019-12-18 ENCOUNTER — Encounter: Payer: Medicare Other | Admitting: *Deleted

## 2019-12-18 ENCOUNTER — Telehealth: Payer: Self-pay | Admitting: Cardiovascular Disease

## 2019-12-18 DIAGNOSIS — I214 Non-ST elevation (NSTEMI) myocardial infarction: Secondary | ICD-10-CM

## 2019-12-18 DIAGNOSIS — Z955 Presence of coronary angioplasty implant and graft: Secondary | ICD-10-CM

## 2019-12-18 NOTE — Progress Notes (Signed)
Daily Session Note  Patient Details  Name: Connor Tyler MRN: [id number] Date of Birth: [birth year] Referring Provider:     Cardiac Rehab from 11/22/2019 in [facility] Cardiac and Pulmonary Rehab  Referring Provider  Fletcher Anon      Encounter Date: 12/18/2019  Check In: Session Check In - 12/18/19 0942      Check-In   Supervising physician immediately available to respond to emergencies  See telemetry face sheet for immediately available ER MD    Location  [facility]-Cardiac & Pulmonary Rehab    Staff Present  Heath Lark, RN, BSN, Laveda Norman, BS, ACSM CEP, Exercise Physiologist;Joseph Tessie Fass RCP,RRT,BSRT    Virtual Visit  No    Medication changes reported      No    Fall or balance concerns reported     No    Warm-up and Cool-down  Performed on first and last piece of equipment    Resistance Training Performed  Yes    VAD Patient?  No    PAD/SET Patient?  No      Pain Assessment   Currently in Pain?  No/denies          Social History   Tobacco Use  Smoking Status Former Smoker  . Packs/day: 1.00  . Years: 13.00  . Pack years: 13.00  . Types: Cigarettes  . Quit date: 76  . Years since quitting: 49.3  Smokeless Tobacco Never Used    Goals Met:  Independence with exercise equipment Exercise tolerated well No report of cardiac concerns or symptoms  Goals Unmet:  Not Applicable  Comments: Pt able to follow exercise prescription today without complaint.  Will continue to monitor for progression.    Dr. Emily Filbert is Medical Director for [place] and [name].

## 2019-12-18 NOTE — Telephone Encounter (Signed)
I agree with your recommendations.  He should continue to monitor his symptoms and notify us of any recurrent episodes.

## 2019-12-18 NOTE — Telephone Encounter (Signed)
Spoke with the patient. Patient sts that he had an episode of jaw tightness on Saturday 12/16/19 while sitting and watching golf. Patient took [employer] with relief of his symptoms. 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 He thinks it may have been indigestion. He denies any associating symptoms. Patient was seen at [facility] today and worked out without incidence. He has not had any reoccurrence of chest/jaw pain.   Adv the patient that he should use Nitro if any reoccurrence of symptoms and that he is to seek emergent care if he has taken [employer] with no relief.  He is scheduled to see Dr. Kirke Corin on 4/22. Adv the patient that I will fwd the message to Dr. Kirke Corin and call back with his response.

## 2019-12-18 NOTE — Telephone Encounter (Signed)
Patient made aware of Dr. Arida's response and recommendation with verbalized understanding. 

## 2019-12-18 NOTE — Telephone Encounter (Signed)
Pt c/o of Chest Pain: STAT if CP now or developed within 24 hours  1. Are you having CP right now? no  2. Are you experiencing any other symptoms (ex. SOB, nausea, vomiting, sweating)? Chest and jaw pain  3. How long have you been experiencing CP? Saturday afternoon, 1 hour  4. Is your CP continuous or coming and going? Coming and going  5. Have you taken Nitroglycerin? Yes, 2  Cardiac rehab told patient to call and make office aware  Please advise ?

## 2019-12-20 ENCOUNTER — Other Ambulatory Visit: Payer: Self-pay

## 2019-12-20 ENCOUNTER — Encounter: Payer: Medicare Other | Admitting: *Deleted

## 2019-12-20 DIAGNOSIS — I214 Non-ST elevation (NSTEMI) myocardial infarction: Secondary | ICD-10-CM | POA: Diagnosis not present

## 2019-12-20 DIAGNOSIS — Z955 Presence of coronary angioplasty implant and graft: Secondary | ICD-10-CM

## 2019-12-20 NOTE — Progress Notes (Signed)
Daily Session Note  Patient Details  Name: ELEUTERIO DOLLAR MRN: [id number] Date of Birth: [birth year] Referring Provider:     Cardiac Rehab from 11/22/2019 in [facility] Cardiac and Pulmonary Rehab  Referring Provider  Fletcher Anon      Encounter Date: 12/20/2019  Check In: Session Check In - 12/20/19 0847      Check-In   Supervising physician immediately available to respond to emergencies  See telemetry face sheet for immediately available ER MD    Location  [facility]-Cardiac & Pulmonary Rehab    Staff Present  Nada Maclachlan, BA, ACSM CEP, Exercise Physiologist;Celina Shiley, RN, BSN, CCRP;Joseph Hood RCP,RRT,BSRT    Virtual Visit  No    Medication changes reported      No    Fall or balance concerns reported     No    Warm-up and Cool-down  Performed on first and last piece of equipment    Resistance Training Performed  Yes    VAD Patient?  No    PAD/SET Patient?  No      Pain Assessment   Currently in Pain?  No/denies          Social History   Tobacco Use  Smoking Status Former Smoker  . Packs/day: 1.00  . Years: 13.00  . Pack years: 13.00  . Types: Cigarettes  . Quit date: 75  . Years since quitting: 49.3  Smokeless Tobacco Never Used    Goals Met:  Independence with exercise equipment Exercise tolerated well No report of cardiac concerns or symptoms  Goals Unmet:  Not Applicable  Comments: Pt able to follow exercise prescription today without complaint.  Will continue to monitor for progression.    Dr. Emily Filbert is Medical Director for [place] and [name].

## 2019-12-22 ENCOUNTER — Other Ambulatory Visit: Payer: Self-pay

## 2019-12-22 DIAGNOSIS — Z955 Presence of coronary angioplasty implant and graft: Secondary | ICD-10-CM

## 2019-12-22 DIAGNOSIS — I214 Non-ST elevation (NSTEMI) myocardial infarction: Secondary | ICD-10-CM

## 2019-12-22 NOTE — Progress Notes (Signed)
Daily Session Note  Patient Details  Name: KHAALID LEFKOWITZ MRN: [id number] Date of Birth: 03-Oct-1945 Referring Provider:     Cardiac Rehab from 11/22/2019 in [facility] Cardiac and Pulmonary Rehab  Referring Provider  Fletcher Anon      Encounter Date: 12/22/2019  Check In: Session Check In - 12/22/19 0823      Check-In   Supervising physician immediately available to respond to emergencies  See telemetry face sheet for immediately available ER MD    Location  [facility]-Cardiac & Pulmonary Rehab    Staff Present  Vida Rigger RN, BSN;Jessica Luan Pulling, MA, RCEP, CCRP, CCET;Joseph Gu-Win RCP,RRT,BSRT    Virtual Visit  No    Medication changes reported      No    Fall or balance concerns reported     No    Warm-up and Cool-down  Performed on first and last piece of equipment    Resistance Training Performed  Yes    VAD Patient?  No    PAD/SET Patient?  No      Pain Assessment   Currently in Pain?  No/denies          Social History   Tobacco Use  Smoking Status Former Smoker  . Packs/day: 1.00  . Years: 13.00  . Pack years: 13.00  . Types: Cigarettes  . Quit date: 22  . Years since quitting: 49.3  Smokeless Tobacco Never Used    Goals Met:  Independence with exercise equipment Exercise tolerated well No report of cardiac concerns or symptoms Strength training completed today  Goals Unmet:  Not Applicable  Comments: Pt able to follow exercise prescription today without complaint.  Will continue to monitor for progression.   Dr. Emily Filbert is Medical Director for [place] and [name].

## 2019-12-25 ENCOUNTER — Other Ambulatory Visit: Payer: Self-pay

## 2019-12-25 ENCOUNTER — Encounter: Payer: Medicare Other | Admitting: *Deleted

## 2019-12-25 DIAGNOSIS — Z955 Presence of coronary angioplasty implant and graft: Secondary | ICD-10-CM

## 2019-12-25 DIAGNOSIS — I214 Non-ST elevation (NSTEMI) myocardial infarction: Secondary | ICD-10-CM | POA: Diagnosis not present

## 2019-12-25 NOTE — Progress Notes (Signed)
Daily Session Note  Patient Details  Name: Connor Tyler MRN: [id number] Date of Birth: [birth year] Referring Provider:     Cardiac Rehab from 11/22/2019 in [facility] Cardiac and Pulmonary Rehab  Referring Provider  Fletcher Anon      Encounter Date: 12/25/2019  Check In: Session Check In - 12/25/19 0817      Check-In   Supervising physician immediately available to respond to emergencies  See telemetry face sheet for immediately available ER MD    Location  [facility]-Cardiac & Pulmonary Rehab    Staff Present  Heath Lark, RN, BSN, Laveda Norman, BS, ACSM CEP, Exercise Physiologist;Joseph Tessie Fass RCP,RRT,BSRT    Virtual Visit  No    Medication changes reported      No    Fall or balance concerns reported     No    Warm-up and Cool-down  Performed on first and last piece of equipment    Resistance Training Performed  Yes    VAD Patient?  No    PAD/SET Patient?  No      Pain Assessment   Currently in Pain?  No/denies          Social History   Tobacco Use  Smoking Status Former Smoker  . Packs/day: 1.00  . Years: 13.00  . Pack years: 13.00  . Types: Cigarettes  . Quit date: 20  . Years since quitting: 49.3  Smokeless Tobacco Never Used    Goals Met:  Independence with exercise equipment Exercise tolerated well No report of cardiac concerns or symptoms  Goals Unmet:  Not Applicable  Comments: Pt able to follow exercise prescription today without complaint.  Will continue to monitor for progression.    Dr. Emily Filbert is Medical Director for [place] and [name].

## 2019-12-27 ENCOUNTER — Encounter: Payer: Medicare Other | Admitting: *Deleted

## 2019-12-27 ENCOUNTER — Other Ambulatory Visit: Payer: Self-pay

## 2019-12-27 ENCOUNTER — Encounter: Payer: Self-pay | Admitting: *Deleted

## 2019-12-27 DIAGNOSIS — I214 Non-ST elevation (NSTEMI) myocardial infarction: Secondary | ICD-10-CM | POA: Diagnosis not present

## 2019-12-27 DIAGNOSIS — Z955 Presence of coronary angioplasty implant and graft: Secondary | ICD-10-CM

## 2019-12-27 NOTE — Progress Notes (Signed)
Cardiac Individual Treatment Plan  Patient Details  Name: Connor Tyler MRN: [id number] Date of Birth: June 09, 1946 Referring Provider:     Cardiac Rehab from 11/22/2019 in [facility] Cardiac and Pulmonary Rehab  Referring Provider  Arida      Initial Encounter Date:    Cardiac Rehab from 11/22/2019 in [facility] Cardiac and Pulmonary Rehab  Date  11/22/19      Visit Diagnosis: NSTEMI (non-ST elevation myocardial infarction) [facility])  Status post coronary artery stent placement  Patient's Home Medications on Admission:  Current Outpatient Medications:  .  albuterol (PROAIR HFA) 108 (90 BASE) MCG/ACT inhaler, Inhale 2 puffs into the lungs every 4 (four) hours as needed for wheezing or shortness of breath. , Disp: , Rfl:  .  amLODipine (NORVASC) 10 MG tablet, Take 1 tablet (10 mg total) by mouth daily., Disp: 30 tablet, Rfl: 6 .  aspirin EC 81 MG tablet, Take 81 mg by mouth daily. , Disp: , Rfl:  .  atorvastatin (LIPITOR) 80 MG tablet, Take 1 tablet (80 mg total) by mouth daily., Disp: 30 tablet, Rfl: 6 .  carvedilol (COREG) 6.25 MG tablet, Take 1 tablet (6.25 mg total) by mouth 2 (two) times daily., Disp: 60 tablet, Rfl: 6 .  cetirizine (ZYRTEC) 10 MG tablet, Take 10 mg by mouth daily. , Disp: , Rfl:  .  Coenzyme Q10 10 MG capsule, Take 10 mg by mouth daily. , Disp: , Rfl:  .  lisinopril (PRINIVIL,ZESTRIL) 40 MG tablet, TAKE 1 TABLET ONE TIME DAILY, Disp: , Rfl:  .  Melatonin 5 MG CAPS, Take 10 mg by mouth at bedtime. , Disp: , Rfl:  .  Multiple Vitamins-Minerals (ZINC PO), Take by mouth. Taking 1 capsule daily, Disp: , Rfl:  .  nitroGLYCERIN (NITROSTAT) 0.4 MG SL tablet, Place 1 tablet (0.4 mg total) under the tongue every 5 (five) minutes as needed for chest pain., Disp: 25 tablet, Rfl: 3 .  pantoprazole (PROTONIX) 40 MG tablet, Take 1 tablet (40 mg total) by mouth daily., Disp: 30 tablet, Rfl: 6 .  phenylephrine-shark liver oil-mineral oil-petrolatum (PREPARATION H) 0.25-3-14-71.9 % rectal  ointment, Place 1 application rectally 2 (two) times daily as needed for hemorrhoids., Disp: , Rfl:  .  ticagrelor (BRILINTA) 90 MG TABS tablet, Take 1 tablet (90 mg total) by mouth 2 (two) times daily., Disp: 60 tablet, Rfl: 6 .  vitamin B-12 (CYANOCOBALAMIN) 500 MCG tablet, Take 500 mcg by mouth daily., Disp: , Rfl:  .  vitamin C (ASCORBIC ACID) 500 MG tablet, Take 500 mg by mouth daily., Disp: , Rfl:   Past Medical History: Past Medical History:  Diagnosis Date  . Allergic rhinitis   . Anemia   . Asthma   . Coronary artery disease 11/11   Diffuse mild 3 vessell CAD by cardiac cath @ the [facility]  . ED (erectile dysfunction)   . GERD (gastroesophageal reflux disease)   . Heart murmur   . History of MRSA infection   . Hypertension   . Mitral regurgitation   . Valvular heart disease     Tobacco Use: Social History   Tobacco Use  Smoking Status Former Smoker  . Packs/day: 1.00  . Years: 13.00  . Pack years: 13.00  . Types: Cigarettes  . Quit date: 19  . Years since quitting: 49.3  Smokeless Tobacco Never Used    Labs: Recent Review Flowsheet Data    There is no flowsheet data to display.       Exercise  Target Goals: Exercise Program Goal: Individual exercise prescription set using results from initial 6 min walk test and THRR while considering  patient's activity barriers and safety.   Exercise Prescription Goal: Initial exercise prescription builds to 30-45 minutes a day of aerobic activity, 2-3 days per week.  Home exercise guidelines will be given to patient during program as part of exercise prescription that the participant will acknowledge.   Education: Aerobic Exercise & Resistance Training: - Gives group verbal and written instruction on the various components of exercise. Focuses on aerobic and resistive training programs and the benefits of this training and how to safely progress through these programs..   Education: Exercise & Equipment  Safety: - Individual verbal instruction and demonstration of equipment use and safety with use of the equipment.   Cardiac Rehab from 11/22/2019 in [facility] Cardiac and Pulmonary Rehab  Date  11/22/19  Educator  AS  Instruction Review Code  1- Verbalizes Understanding      Education: Exercise Physiology & General Exercise Guidelines: - Group verbal and written instruction with models to review the exercise physiology of the cardiovascular system and associated critical values. Provides general exercise guidelines with specific guidelines to those with heart or lung disease.    Education: Flexibility, Balance, Mind/Body Relaxation: Provides group verbal/written instruction on the benefits of flexibility and balance training, including mind/body exercise modes such as yoga, pilates and tai chi.  Demonstration and skill practice provided.   Activity Barriers & Risk Stratification:   6 Minute Walk: 6 Minute Walk    Row Name 11/22/19 1311         6 Minute Walk   Phase  Initial     Distance  1698 feet     Walk Time  6 minutes     # of Rest Breaks  0     MPH  3.2     METS  3.33     RPE  9     Perceived Dyspnea   0     VO2 Peak  11.7     Symptoms  No     Resting HR  78 bpm     Resting BP  134/66     Resting Oxygen Saturation   96 %     Exercise Oxygen Saturation  during 6 min walk  98 %     Max Ex. HR  104 bpm     Max Ex. BP  122/56     2 Minute Post BP  116/60        Oxygen Initial Assessment:   Oxygen Re-Evaluation:   Oxygen Discharge (Final Oxygen Re-Evaluation):   Initial Exercise Prescription: Initial Exercise Prescription - 11/22/19 1300      Date of Initial Exercise RX and Referring Provider   Date  11/22/19    Referring Provider  Arida      Treadmill   MPH  3    Grade  0    Minutes  15    METs  3.33      Recumbant Bike   Level  3    RPM  60    Watts  38    Minutes  15    METs  3.33      Elliptical   Level  1    Speed  3    Minutes  15       REL-XR   Level  3    Speed  50    Minutes  15  METs  3.3      Prescription Details   Frequency (times per week)  3    Duration  Progress to 30 minutes of continuous aerobic without signs/symptoms of physical distress      Intensity   THRR 40-80% of Max Heartrate  106-134    Ratings of Perceived Exertion  11-15    Perceived Dyspnea  0-4      Resistance Training   Training Prescription  Yes    Weight  6 lb    Reps  10-15       Perform Capillary Blood Glucose checks as needed.  Exercise Prescription Changes: Exercise Prescription Changes    Row Name 11/22/19 1300 12/06/19 1600 12/22/19 1000         Response to Exercise   Blood Pressure (Admit)  134/66  110/70  130/64     Blood Pressure (Exercise)  122/56  122/70  158/64     Blood Pressure (Exit)  116/60  128/82  120/60     Heart Rate (Admit)  78 bpm  77 bpm  79 bpm     Heart Rate (Exercise)  104 bpm  102 bpm  107 bpm     Heart Rate (Exit)  86 bpm  88 bpm  91 bpm     Oxygen Saturation (Admit)  96 %  --  --     Oxygen Saturation (Exercise)  98 %  --  --     Rating of Perceived Exertion (Exercise)  '9  12  14     ' Perceived Dyspnea (Exercise)  0  --  --     Symptoms  none  none  none     Duration  --  Continue with 30 min of aerobic exercise without signs/symptoms of physical distress.  Continue with 30 min of aerobic exercise without signs/symptoms of physical distress.     Intensity  --  THRR unchanged  THRR unchanged       Progression   Progression  --  Continue to progress workloads to maintain intensity without signs/symptoms of physical distress.  Continue to progress workloads to maintain intensity without signs/symptoms of physical distress.     Average METs  --  3.8  4.27       Resistance Training   Training Prescription  --  Yes  Yes     Weight  --  6 lb  6 lb     Reps  --  10-15  10-15       Interval Training   Interval Training  --  No  No       Treadmill   MPH  --  3.1  3.4     Grade  --  1  3      Minutes  --  15  15     METs  --  3.8  5.01       REL-XR   Level  --  3  4     Minutes  --  15  15     METs  --  3.8  4.4       T5 Nustep   Level  --  --  4     Minutes  --  --  15     METs  --  --  3.4        Exercise Comments:   Exercise Goals and Review: Exercise Goals    Row Name 11/22/19 1317  Exercise Goals   Increase Physical Activity  Yes       Intervention  Provide advice, education, support and counseling about physical activity/exercise needs.;Develop an individualized exercise prescription for aerobic and resistive training based on initial evaluation findings, risk stratification, comorbidities and participant's personal goals.       Expected Outcomes  Short Term: Attend rehab on a regular basis to increase amount of physical activity.;Long Term: Add in home exercise to make exercise part of routine and to increase amount of physical activity.;Long Term: Exercising regularly at least 3-5 days a week.       Increase Strength and Stamina  Yes       Intervention  Provide advice, education, support and counseling about physical activity/exercise needs.;Develop an individualized exercise prescription for aerobic and resistive training based on initial evaluation findings, risk stratification, comorbidities and participant's personal goals.       Expected Outcomes  Short Term: Increase workloads from initial exercise prescription for resistance, speed, and METs.;Short Term: Perform resistance training exercises routinely during rehab and add in resistance training at home;Long Term: Improve cardiorespiratory fitness, muscular endurance and strength as measured by increased METs and functional capacity (6MWT)       Able to understand and use rate of perceived exertion (RPE) scale  Yes       Intervention  Provide education and explanation on how to use RPE scale       Expected Outcomes  Short Term: Able to use RPE daily in rehab to express subjective intensity  level;Long Term:  Able to use RPE to guide intensity level when exercising independently       Able to understand and use Dyspnea scale  Yes       Intervention  Provide education and explanation on how to use Dyspnea scale       Expected Outcomes  Short Term: Able to use Dyspnea scale daily in rehab to express subjective sense of shortness of breath during exertion;Long Term: Able to use Dyspnea scale to guide intensity level when exercising independently       Knowledge and understanding of Target Heart Rate Range (THRR)  Yes       Intervention  Provide education and explanation of THRR including how the numbers were predicted and where they are located for reference       Expected Outcomes  Short Term: Able to state/look up THRR;Short Term: Able to use daily as guideline for intensity in rehab;Long Term: Able to use THRR to govern intensity when exercising independently       Able to check pulse independently  Yes       Intervention  Provide education and demonstration on how to check pulse in carotid and radial arteries.;Review the importance of being able to check your own pulse for safety during independent exercise       Expected Outcomes  Short Term: Able to explain why pulse checking is important during independent exercise;Long Term: Able to check pulse independently and accurately       Understanding of Exercise Prescription  Yes       Intervention  Provide education, explanation, and written materials on patient's individual exercise prescription       Expected Outcomes  Short Term: Able to explain program exercise prescription;Long Term: Able to explain home exercise prescription to exercise independently          Exercise Goals Re-Evaluation : Exercise Goals Re-Evaluation    Row Name 11/23/19 1201 12/04/19 1131 12/22/19 1037  Exercise Goal Re-Evaluation   Exercise Goals Review  Increase Physical Activity;Able to understand and use rate of perceived exertion (RPE)  scale;Knowledge and understanding of Target Heart Rate Range (THRR);Understanding of Exercise Prescription;Increase Strength and Stamina;Able to check pulse independently  Increase Physical Activity;Increase Strength and Stamina;Able to understand and use rate of perceived exertion (RPE) scale;Understanding of Exercise Prescription  Increase Physical Activity;Increase Strength and Stamina;Understanding of Exercise Prescription     Comments  Reviewed RPE scale, THR and program prescription with pt today.  Pt voiced understanding and was given a copy of goals to take home.  Patient needed a better understanding of the RPE scale. Informed patient how to gauge how hard he is working. Patient verbalizes understanding. Connor Tyler is walking every afternoon two miles a day when he is not in [employer] [facility] He wants to gain some strength while he is in the program.  Connor Tyler is doing well in rehab.  He is now able to play a full 18 holes and not get tired.  He was very excited about this and was even able to beat his partner!  He is now up to 3.4 mph on the treadmill as well.  We will conitnue to monitor his progress.     Expected Outcomes  Short: Use RPE daily to regulate intensity. Long: Follow program prescription in THR.  Short: work on [employer] for resistance exercise. Long: Exercise post HeartTrack with weights independently  Short: Continue to progress workloads  Long; Continue to build strength and stamina for golf.        Discharge Exercise Prescription (Final Exercise Prescription Changes): Exercise Prescription Changes - 12/22/19 1000      Response to Exercise   Blood Pressure (Admit)  130/64    Blood Pressure (Exercise)  158/64    Blood Pressure (Exit)  120/60    Heart Rate (Admit)  79 bpm    Heart Rate (Exercise)  107 bpm    Heart Rate (Exit)  91 bpm    Rating of Perceived Exertion (Exercise)  14    Symptoms  none    Duration  Continue with 30 min of aerobic exercise without signs/symptoms of  physical distress.    Intensity  THRR unchanged      Progression   Progression  Continue to progress workloads to maintain intensity without signs/symptoms of physical distress.    Average METs  4.27      Resistance Training   Training Prescription  Yes    Weight  6 lb    Reps  10-15      Interval Training   Interval Training  No      Treadmill   MPH  3.4    Grade  3    Minutes  15    METs  5.01      REL-XR   Level  4    Minutes  15    METs  4.4      T5 Nustep   Level  4    Minutes  15    METs  3.4       Nutrition:  Target Goals: Understanding of nutrition guidelines, daily intake of sodium <1527m, cholesterol <20102m calories 30% from fat and 7% or less from saturated fats, daily to have 5 or more servings of fruits and vegetables.  Education: Controlling Sodium/Reading Food Labels -Group verbal and written material supporting the discussion of sodium use in heart healthy nutrition. Review and explanation with models, verbal and written materials for utilization of  the food label.   Education: General Nutrition Guidelines/Fats and Fiber: -Group instruction provided by verbal, written material, models and posters to present the general guidelines for heart healthy nutrition. Gives an explanation and review of dietary fats and fiber.   Biometrics: Pre Biometrics - 11/22/19 1317      Pre Biometrics   Height  5' 8.5" (1.74 m)    Weight  198 lb 14.4 oz (90.2 kg)    BMI (Calculated)  29.8    Single Leg Stand  30 seconds        Nutrition Therapy Plan and Nutrition Goals: Nutrition Therapy & Goals - 11/30/19 1123      Nutrition Therapy   Diet  HH, Low Na    Protein (specify units)  70-75g    Fiber  30 grams    Whole Grain Foods  3 servings    Saturated Fats  12 max. grams    Fruits and Vegetables  5 servings/day    Sodium  1.5 grams      Personal Nutrition Goals   Nutrition Goal  ST: add peanut butter to apple or banana snack LT: get back in shape 6/10  now    Comments  B: bowl of oatmeal with milk (2%) and agave and cup of coffee (decaf). S: apple or banana D: fish, 2-3x chicken, steak 1x/week. Pt just bought 2 containers of crunchy jif, told pt he could use those, but to choose a peanut butter with just peanuts after finished. Discussed HH eating.      Intervention Plan   Intervention  Nutrition handout(s) given to patient.;Prescribe, educate and counsel regarding individualized specific dietary modifications aiming towards targeted core components such as weight, hypertension, lipid management, diabetes, heart failure and other comorbidities.    Expected Outcomes  Short Term Goal: Understand basic principles of dietary content, such as calories, fat, sodium, cholesterol and nutrients.;Short Term Goal: A plan has been developed with personal nutrition goals set during dietitian appointment.;Long Term Goal: Adherence to prescribed nutrition plan.       Nutrition Assessments: Nutrition Assessments - 11/22/19 1253      MEDFICTS Scores   Pre Score  100       MEDIFICTS Score Key:          ?70 Need to make dietary changes          40-70 Heart Healthy Diet         ? 40 Therapeutic Level Cholesterol Diet  Nutrition Goals Re-Evaluation:   Nutrition Goals Discharge (Final Nutrition Goals Re-Evaluation):   Psychosocial: Target Goals: Acknowledge presence or absence of significant depression and/or stress, maximize coping skills, provide positive support system. Participant is able to verbalize types and ability to use techniques and skills needed for reducing stress and depression.   Education: Depression - Provides group verbal and written instruction on the correlation between heart/lung disease and depressed mood, treatment options, and the stigmas associated with seeking treatment.   Education: Sleep Hygiene -Provides group verbal and written instruction about how sleep can affect your health.  Define sleep hygiene, discuss sleep  cycles and impact of sleep habits. Review good sleep hygiene tips.     Education: Stress and Anxiety: - Provides group verbal and written instruction about the health risks of elevated stress and causes of high stress.  Discuss the correlation between heart/lung disease and anxiety and treatment options. Review healthy ways to manage with stress and anxiety.    Initial Review & Psychosocial Screening: Initial Psych Review &  Screening - 11/21/19 1310      Initial Review   Current issues with  Current Sleep Concerns;Current Stress Concerns    Source of Stress Concerns  Financial    Comments  He has cut back on work from [employer] and his stress levels have been lower.      Family Dynamics   Good Support System?  Yes    Comments  He can look to his wife and son.      Barriers   Psychosocial barriers to participate in program  There are no identifiable barriers or psychosocial needs.;The patient should benefit from training in stress management and relaxation.      Screening Interventions   Interventions  Encouraged to exercise;To provide support and resources with identified psychosocial needs;Provide feedback about the scores to participant    Expected Outcomes  Short Term goal: Utilizing psychosocial counselor, staff and physician to assist with identification of specific Stressors or current issues interfering with healing process. Setting desired goal for each stressor or current issue identified.;Long Term Goal: Stressors or current issues are controlled or eliminated.;Short Term goal: Identification and review with participant of any Quality of Life or Depression concerns found by scoring the questionnaire.;Long Term goal: The participant improves quality of Life and PHQ9 Scores as seen by post scores and/or verbalization of changes       Quality of Life Scores:  Quality of Life - 11/22/19 1321      Quality of Life   Select  Quality of Life      Quality of Life Scores    Health/Function Pre  27.2 %    Socioeconomic Pre  29.29 %    Psych/Spiritual Pre  29.14 %    Family Pre  29.38 %    GLOBAL Pre  28.32 %      Scores of 19 and below usually indicate a poorer quality of life in these areas.  A difference of  2-3 points is a clinically meaningful difference.  A difference of 2-3 points in the total score of the Quality of Life Index has been associated with significant improvement in overall quality of life, self-image, physical symptoms, and general health in studies assessing change in quality of life.  PHQ-9: Recent Review Flowsheet Data    Depression screen [facility] 2/9 11/22/2019   Decreased Interest 0   Down, Depressed, Hopeless 0   PHQ - 2 Score 0   Altered sleeping 0   Tired, decreased energy 1   Feeling bad or failure about yourself  0   Trouble concentrating 0   Moving slowly or fidgety/restless 0   Suicidal thoughts 0   PHQ-9 Score 1   Difficult doing work/chores Not difficult at all     Interpretation of Total Score  Total Score Depression Severity:  1-4 = Minimal depression, 5-9 = Mild depression, 10-14 = Moderate depression, 15-19 = Moderately severe depression, 20-27 = Severe depression   Psychosocial Evaluation and Intervention: Psychosocial Evaluation - 11/21/19 1312      Psychosocial Evaluation & Interventions   Interventions  Encouraged to exercise with the program and follow exercise prescription    Comments  He has cut back on work from [employer] and his stress levels have been lower.    Expected Outcomes  Short: Attend HeartTrack stress management education to decrease stress. Long: Maintain exercise Post HeartTrack to keep stress at a minimum.    Continue Psychosocial Services   Follow up required by staff  Psychosocial Re-Evaluation: Psychosocial Re-Evaluation    [place] Name 12/04/19 1136             Psychosocial Re-Evaluation   Current issues with  None Identified       Comments  Connor Tyler is sleeping well and has a  positive attitude. He has sold a house this week and is feeling good about it.       Expected Outcomes  Short: Attend HeartTrack stress management education to decrease stress. Long: Maintain exercise Post HeartTrack to keep stress at a minimum.       Interventions  Encouraged to attend Cardiac Rehabilitation for the exercise       Continue Psychosocial Services   Follow up required by staff          Psychosocial Discharge (Final Psychosocial Re-Evaluation): Psychosocial Re-Evaluation - 12/04/19 1136      Psychosocial Re-Evaluation   Current issues with  None Identified    Comments  Connor Tyler is sleeping well and has a positive attitude. He has sold a house this week and is feeling good about it.    Expected Outcomes  Short: Attend HeartTrack stress management education to decrease stress. Long: Maintain exercise Post HeartTrack to keep stress at a minimum.    Interventions  Encouraged to attend Cardiac Rehabilitation for the exercise    Continue Psychosocial Services   Follow up required by staff       Vocational Rehabilitation: Provide vocational rehab assistance to qualifying candidates.   Vocational Rehab Evaluation & Intervention:   Education: Education Goals: Education classes will be provided on a variety of topics geared toward better understanding of heart health and risk factor modification. Participant will state understanding/return demonstration of topics presented as noted by education test scores.  Learning Barriers/Preferences: Learning Barriers/Preferences - 11/21/19 1313      Learning Barriers/Preferences   Learning Barriers  Sight    Learning Preferences  None       General Cardiac Education Topics:  AED/CPR: - Group verbal and written instruction with the use of models to demonstrate the basic use of the AED with the basic ABC's of resuscitation.   Anatomy & Physiology of the Heart: - Group verbal and written instruction and models provide basic cardiac  anatomy and physiology, with the coronary electrical and arterial systems. Review of Valvular disease and Heart Failure   Cardiac Procedures: - Group verbal and written instruction to review commonly prescribed medications for heart disease. Reviews the medication, class of the drug, and side effects. Includes the steps to properly store meds and maintain the prescription regimen. (beta blockers and nitrates)   Cardiac Medications I: - Group verbal and written instruction to review commonly prescribed medications for heart disease. Reviews the medication, class of the drug, and side effects. Includes the steps to properly store meds and maintain the prescription regimen.   Cardiac Medications II: -Group verbal and written instruction to review commonly prescribed medications for heart disease. Reviews the medication, class of the drug, and side effects. (all other drug classes)    Go Sex-Intimacy & Heart Disease, Get SMART - Goal Setting: - Group verbal and written instruction through game format to discuss heart disease and the return to sexual intimacy. Provides group verbal and written material to discuss and apply goal setting through the application of the S.M.A.R.T. Method.   Other Matters of the Heart: - Provides group verbal, written materials and models to describe Stable Angina and Peripheral Artery. Includes description of the disease process and treatment  options available to the cardiac patient.   Infection Prevention: - Provides verbal and written material to individual with discussion of infection control including proper hand washing and proper equipment cleaning during exercise session.   Cardiac Rehab from 11/22/2019 in [facility] Cardiac and Pulmonary Rehab  Date  11/22/19  Educator  AS  Instruction Review Code  1- Verbalizes Understanding      Falls Prevention: - Provides verbal and written material to individual with discussion of falls prevention and safety.    Cardiac Rehab from 11/22/2019 in [facility] Cardiac and Pulmonary Rehab  Date  11/22/19  Educator  AS  Instruction Review Code  1- Verbalizes Understanding      Other: -Provides group and verbal instruction on various topics (see comments)   Knowledge Questionnaire Score: Knowledge Questionnaire Score - 11/22/19 1321      Knowledge Questionnaire Score   Pre Score  24/28       Core Components/Risk Factors/Patient Goals at Admission: Personal Goals and Risk Factors at Admission - 11/22/19 1318      Core Components/Risk Factors/Patient Goals on Admission    Weight Management  Yes    Intervention  Weight Management: Develop a combined nutrition and exercise program designed to reach desired caloric intake, while maintaining appropriate intake of nutrient and fiber, sodium and fats, and appropriate energy expenditure required for the weight goal.;Weight Management/Obesity: Establish reasonable short term and long term weight goals.;Weight Management: Provide education and appropriate resources to help participant work on and attain dietary goals.    Admit Weight  198 lb 14.4 oz (90.2 kg)    Expected Outcomes  Short Term: Continue to assess and modify interventions until short term weight is achieved;Weight Maintenance: Understanding of the daily nutrition guidelines, which includes 25-35% calories from fat, 7% or less cal from saturated fats, less than 239m cholesterol, less than 1.5gm of sodium, & 5 or more servings of fruits and vegetables daily;Long Term: Adherence to nutrition and physical activity/exercise program aimed toward attainment of established weight goal;Weight Loss: Understanding of general recommendations for a balanced deficit meal plan, which promotes 1-2 lb weight loss per week and includes a negative energy balance of [phone] kcal/d;Understanding recommendations for meals to include 15-35% energy as protein, 25-35% energy from fat, 35-60% energy from carbohydrates, less than  2065mof dietary cholesterol, 20-35 gm of total fiber daily    Hypertension  Yes    Intervention  Provide education on lifestyle modifcations including regular physical activity/exercise, weight management, moderate sodium restriction and increased consumption of fresh fruit, vegetables, and low fat dairy, alcohol moderation, and smoking cessation.;Monitor prescription use compliance.    Expected Outcomes  Short Term: Continued assessment and intervention until BP is < 140/9032mG in hypertensive participants. < 130/5m59m in hypertensive participants with diabetes, heart failure or chronic kidney disease.;Long Term: Maintenance of blood pressure at goal levels.    Intervention  Provide education and support for participant on nutrition & aerobic/resistive exercise along with prescribed medications to achieve LDL <70mg89mL >40mg.31mExpected Outcomes  Short Term: Participant states understanding of desired cholesterol values and is compliant with medications prescribed. Participant is following exercise prescription and nutrition guidelines.;Long Term: Cholesterol controlled with medications as prescribed, with individualized exercise RX and with personalized nutrition plan. Value goals: LDL < 70mg, 69m> 40 mg.       Education:Diabetes - Individual verbal and written instruction to review signs/symptoms of diabetes, desired ranges of glucose level fasting, after meals and with exercise. Acknowledge  that pre and post exercise glucose checks will be done for 3 sessions at entry of program.   Education: Know Your Numbers and Risk Factors: -Group verbal and written instruction about important numbers in your health.  Discussion of what are risk factors and how they play a role in the disease process.  Review of Cholesterol, Blood Pressure, Diabetes, and BMI and the role they play in your overall health.   Core Components/Risk Factors/Patient Goals Review:  Goals and Risk Factor Review    Row Name  12/04/19 1137             Core Components/Risk Factors/Patient Goals Review   Personal Goals Review  Weight Management/Obesity;Hypertension;Lipids       Review  Connor Tyler wants to lose about 20 pounds. Since the start of the program he has lost 3 pounds. He checks his blood pressure twice a day at home. His heart rate has been 65 bpm at rest.       Expected Outcomes  Short: lose 5 pounds in couple weeks. Long: lose 20 pounds.          Core Components/Risk Factors/Patient Goals at Discharge (Final Review):  Goals and Risk Factor Review - 12/04/19 1137      Core Components/Risk Factors/Patient Goals Review   Personal Goals Review  Weight Management/Obesity;Hypertension;Lipids    Review  Connor Tyler wants to lose about 20 pounds. Since the start of the program he has lost 3 pounds. He checks his blood pressure twice a day at home. His heart rate has been 65 bpm at rest.    Expected Outcomes  Short: lose 5 pounds in couple weeks. Long: lose 20 pounds.       ITP Comments: ITP Comments    Row Name 11/21/19 1324 11/23/19 1200 11/29/19 1051 12/27/19 1038     ITP Comments  Virtual Orientation performed. Patient informed when to come in for RD and EP orientation. Diagnosis can be found in Care Everywhere 11/10/2019.  First full day of exercise!  Patient was oriented to gym and equipment including functions, settings, policies, and procedures.  Patient's individual exercise prescription and treatment plan were reviewed.  All starting workloads were established based on the results of the 6 minute walk test done at initial orientation visit.  The plan for exercise progression was also introduced and progression will be customized based on patient's performance and goals  30 day chart review completed. ITP sent to Dr Zachery Dakins Medical Director, for review,changes as needed and signature.  New to program  30 day chart review completed. ITP sent to Dr Zachery Dakins Medical Director, for review,changes as  needed and signature. Continue with ITP if no changes requested       Comments:

## 2019-12-27 NOTE — Progress Notes (Signed)
Daily Session Note  Patient Details  Name: Connor Tyler MRN: [id number] Date of Birth: 21-Feb-1946 Referring Provider:     Cardiac Rehab from 11/22/2019 in [facility] Cardiac and Pulmonary Rehab  Referring Provider  Fletcher Anon      Encounter Date: 12/27/2019  Check In: Session Check In - 12/27/19 0817      Check-In   Supervising physician immediately available to respond to emergencies  See telemetry face sheet for immediately available ER MD    Location  [facility]-Cardiac & Pulmonary Rehab    Staff Present  Heath Lark, RN, BSN, CCRP;Amanda Sommer, BA, ACSM CEP, Exercise Physiologist;Joseph Hood RCP,RRT,BSRT;Jessica La Luz, [place], RCEP, CCRP, CCET    Virtual Visit  No    Medication changes reported      No    Fall or balance concerns reported     No    Warm-up and Cool-down  Performed on first and last piece of equipment    Resistance Training Performed  Yes    VAD Patient?  No    PAD/SET Patient?  No      Pain Assessment   Currently in Pain?  No/denies          Social History   Tobacco Use  Smoking Status Former Smoker  . Packs/day: 1.00  . Years: 13.00  . Pack years: 13.00  . Types: Cigarettes  . Quit date: 48  . Years since quitting: 49.3  Smokeless Tobacco Never Used    Goals Met:  Independence with exercise equipment Exercise tolerated well No report of cardiac concerns or symptoms  Goals Unmet:  Not Applicable  Comments: Pt able to follow exercise prescription today without complaint.  Will continue to monitor for progression.    Dr. Emily Filbert is Medical Director for [place] and [name].

## 2019-12-28 ENCOUNTER — Other Ambulatory Visit: Payer: Self-pay

## 2019-12-28 ENCOUNTER — Encounter: Payer: Self-pay | Admitting: Cardiovascular Disease

## 2019-12-28 ENCOUNTER — Ambulatory Visit (INDEPENDENT_AMBULATORY_CARE_PROVIDER_SITE_OTHER): Payer: Medicare Other | Admitting: Cardiovascular Disease

## 2019-12-28 VITALS — BP 110/60 | HR 67 | Ht 65.0 in | Wt 190.2 lb

## 2019-12-28 DIAGNOSIS — E785 Hyperlipidemia, unspecified: Secondary | ICD-10-CM | POA: Diagnosis not present

## 2019-12-28 DIAGNOSIS — I10 Essential (primary) hypertension: Secondary | ICD-10-CM | POA: Diagnosis not present

## 2019-12-28 DIAGNOSIS — I2510 Atherosclerotic heart disease of native coronary artery without angina pectoris: Secondary | ICD-10-CM

## 2019-12-28 DIAGNOSIS — I341 Nonrheumatic mitral (valve) prolapse: Secondary | ICD-10-CM

## 2019-12-28 NOTE — Patient Instructions (Signed)
Medication Instructions:  Your physician recommends that you continue on your current medications as directed. Please refer to the Current Medication list given to you today.  *If you need a refill on your cardiac medications before your next appointment, please call your pharmacy*   Lab Work: Lipid and Hepatic panel today.  If you have labs (blood work) drawn today and your tests are completely normal, you will receive your results only by: Marland Kitchen MyChart Message (if you have MyChart) OR . A paper copy in the mail If you have any lab test that is abnormal or we need to change your treatment, we will call you to review the results.   Testing/Procedures: None ordered   Follow-Up: At [facility], you and your health needs are our priority.  As part of our continuing mission to provide you with exceptional heart care, we have created designated Provider Care Teams.  These Care Teams include your primary Cardiologist (physician) and Advanced Practice Providers (APPs -  Physician Assistants and Nurse Practitioners) who all work together to provide you with the care you need, when you need it.  We recommend signing up for the patient portal called "MyChart".  Sign up information is provided on this After Visit Summary.  MyChart is used to connect with patients for Virtual Visits (Telemedicine).  Patients are able to view lab/test results, encounter notes, upcoming appointments, etc.  Non-urgent messages can be sent to your provider as well.   To learn more about what you can do with MyChart, go to [email or web address].    Your next appointment:   6 month(s)  The format for your next appointment:   In Person  Provider:    You may see Lorine Bears, MD or one of the following Advanced Practice Providers on your designated Care Team:    Nicolasa Ducking, NP  Eula Listen, PA-C  Marisue Ivan, PA-C    Other Instructions N/A

## 2019-12-28 NOTE — Progress Notes (Signed)
Cardiology Office Note   Date:  12/28/2019   ID:  Connor Tyler, DOB 1946-07-06, MRN [id number]  PCP:  Adin Hector, MD  Cardiologist:   Kathlyn Sacramento, MD   Chief Complaint  Patient presents with  . other    1 month f/u no complaints today. Meds reviewed verbally with pt.      History of Present Illness: Connor Tyler is a 74 y.o. male who presents for a follow up visit regarding coronary artery disease and moderate regurgitation due to mitral valve prolapse.   He has prolonged history of mitral regurgitation and a heart murmur. He was evaluated in 2011 at the [facility] clinic. It was concluded that his mitral regurgitation was moderate and not severe and thus surgery was not needed. Cardiac catheterization showed mild three-vessel coronary artery disease at that time. Other medical problems include hypertension.  He had non-ST elevation myocardial infarction in March of this year at [facility].  Troponin was minimally elevated.  Cardiac catheterization showed severe stenosis affecting the right coronary artery and OM.  He underwent PCI and drug-eluting stent placement to both RCA and OM. Ejection fraction was normal. He has been doing very well and has been attending cardiac rehab.  He denies chest pain, shortness of breath or palpitations.  He lost 20 pounds and feels great.  He takes his medications regularly with no reported side effects.  Past Medical History:  Diagnosis Date  . Allergic rhinitis   . Anemia   . Asthma   . Coronary artery disease 11/11   Diffuse mild 3 vessell CAD by cardiac cath @ the [facility]  . ED (erectile dysfunction)   . GERD (gastroesophageal reflux disease)   . Heart murmur   . History of MRSA infection   . Hypertension   . Mitral regurgitation   . Valvular heart disease     Past Surgical History:  Procedure Laterality Date  . ANAL FISSURE REPAIR    . [facility]  . CATARACT  EXTRACTION W/PHACO Left 01/25/2018   Procedure: CATARACT EXTRACTION PHACO AND INTRAOCULAR LENS PLACEMENT (IOC);  Surgeon: Birder Robson, MD;  Location: [facility] ORS;  Service: Ophthalmology;  Laterality: Left;  [place] 00:49 AP% 13.3 CDE 6.60 Fluid pack lot # [id number]  . COLONOSCOPY    . COLONOSCOPY WITH PROPOFOL N/A 09/14/2018   Procedure: COLONOSCOPY WITH PROPOFOL;  Surgeon: Manya Silvas, MD;  Location: [facility] ENDOSCOPY;  Service: Endoscopy;  Laterality: N/A;  . ESOPHAGOGASTRODUODENOSCOPY (EGD) WITH PROPOFOL N/A 09/14/2018   Procedure: ESOPHAGOGASTRODUODENOSCOPY (EGD) WITH PROPOFOL;  Surgeon: Manya Silvas, MD;  Location: [facility] ENDOSCOPY;  Service: Endoscopy;  Laterality: N/A;  IV ZOSYN  . TONSILLECTOMY    . wisdom teeth resected       Current Outpatient Medications  Medication Sig Dispense Refill  . albuterol (PROAIR HFA) 108 (90 BASE) MCG/ACT inhaler Inhale 2 puffs into the lungs every 4 (four) hours as needed for wheezing or shortness of breath.     Marland Kitchen amLODipine (NORVASC) 10 MG tablet Take 1 tablet (10 mg total) by mouth daily. 30 tablet 6  . aspirin EC 81 MG tablet Take 81 mg by mouth daily.     Marland Kitchen atorvastatin (LIPITOR) 80 MG tablet Take 1 tablet (80 mg total) by mouth daily. 30 tablet 6  . carvedilol (COREG) 6.25 MG tablet Take 1 tablet (6.25 mg total) by mouth 2 (two) times daily. 60 tablet 6  . cetirizine (ZYRTEC)  10 MG tablet Take 10 mg by mouth daily.     . Coenzyme Q10 10 MG capsule Take 10 mg by mouth daily.     Marland Kitchen lisinopril (PRINIVIL,ZESTRIL) 40 MG tablet TAKE 1 TABLET ONE TIME DAILY    . Melatonin 5 MG CAPS Take 10 mg by mouth at bedtime.     . Multiple Vitamins-Minerals (ZINC PO) Take by mouth. Taking 1 capsule daily    . nitroGLYCERIN (NITROSTAT) 0.4 MG SL tablet Place 1 tablet (0.4 mg total) under the tongue every 5 (five) minutes as needed for chest pain. 25 tablet 3  . pantoprazole (PROTONIX) 40 MG tablet Take 1 tablet (40 mg total) by mouth daily. 30 tablet 6  .  phenylephrine-shark liver oil-mineral oil-petrolatum (PREPARATION H) 0.25-3-14-71.9 % rectal ointment Place 1 application rectally 2 (two) times daily as needed for hemorrhoids.    . ticagrelor (BRILINTA) 90 MG TABS tablet Take 1 tablet (90 mg total) by mouth 2 (two) times daily. 60 tablet 6  . vitamin B-12 (CYANOCOBALAMIN) 500 MCG tablet Take 500 mcg by mouth daily.    . vitamin C (ASCORBIC ACID) 500 MG tablet Take 500 mg by mouth daily.     No current facility-administered medications for this visit.    Allergies:   Patient has no known allergies.    Social History:  The patient  reports that he quit smoking about 49 years ago. His smoking use included cigarettes. He has a 13.00 pack-year smoking history. He has never used smokeless tobacco. He reports current alcohol use. He reports that he does not use drugs.   Family History:  The patient's family history includes AAA (abdominal aortic aneurysm) in his father; Dementia in his mother; Heart attack in his father; Stroke in his mother.    ROS:  Please see the history of present illness.   Otherwise, review of systems are positive for none.   All other systems are reviewed and negative.    PHYSICAL EXAM: VS:  BP 110/60 (BP Location: Left Arm, Patient Position: Sitting, Cuff Size: Normal)   Pulse 67   Ht 5\' 5"  (1.651 m)   Wt 190 lb 4 oz (86.3 kg)   SpO2 97%   BMI 31.66 kg/m  , BMI Body mass index is 31.66 kg/m. GEN: Well nourished, well developed, in no acute distress  HEENT: normal  Neck: no JVD, carotid bruits, or masses Cardiac: RRR; no  rubs, or gallops,no edema . There is 2/6 holosystolic murmur at the apex radiating to the axilla Respiratory:  clear to auscultation bilaterally, normal work of breathing GI: soft, nontender, nondistended, + BS MS: no deformity or atrophy  Skin: warm and dry, no rash Neuro:  Strength and sensation are intact Psych: euthymic mood, full affect   EKG:  EKG is ordered today. The ekg ordered  today demonstrates normal sinus rhythm with nonspecific T wave changes.   Recent Labs: 11/13/2019: BUN 18; Creatinine, Ser 0.91; Hemoglobin 14.0; Platelets 215; Potassium 4.4; Sodium 143    Lipid Panel No results found for: CHOL, TRIG, HDL, CHOLHDL, VLDL, LDLCALC, LDLDIRECT    Wt Readings from Last 3 Encounters:  12/28/19 190 lb 4 oz (86.3 kg)  11/27/19 197 lb 4 oz (89.5 kg)  11/22/19 198 lb 14.4 oz (90.2 kg)        ASSESSMENT AND PLAN:  1.   Coronary artery disease involving native coronary arteries without angina: He is doing extremely well after his small non-ST elevation myocardial infarction in March with  subsequent stenting of the RCA and OM.  Continue dual antiplatelet therapy at least until March 2022.  He is doing great with lifestyle changes.  2. Mitral regurgitation due to mitral valve prolapse: This has been only mild on recent echocardiograms.  He is asymptomatic.  3. Essential hypertension: Blood pressure is well controlled on current medications.  4. Hyperlipidemia: The patient was switched from lovastatin to atorvastatin 80 mg daily given his myocardial infarction.  I requested follow-up lipid and liver profile.    Disposition:   FU with me in 6 months  Signed,  Lorine Bears, MD  12/28/2019 12:22 PM    [facility]

## 2019-12-29 ENCOUNTER — Encounter: Payer: Medicare Other | Admitting: *Deleted

## 2019-12-29 DIAGNOSIS — I214 Non-ST elevation (NSTEMI) myocardial infarction: Secondary | ICD-10-CM

## 2019-12-29 LAB — HEPATIC FUNCTION PANEL
ALT: 28 IU/L (ref 0–44)
AST: 27 IU/L (ref 0–40)
Albumin: 4.4 g/dL (ref 3.7–4.7)
Alkaline Phosphatase: 135 IU/L — ABNORMAL HIGH (ref 39–117)
Bilirubin Total: 0.7 mg/dL (ref 0.0–1.2)
Bilirubin, Direct: 0.22 mg/dL (ref 0.00–0.40)
Total Protein: 6.4 g/dL (ref 6.0–8.5)

## 2019-12-29 LAB — LIPID PANEL
Chol/HDL Ratio: 2.2 ratio (ref 0.0–5.0)
Cholesterol, Total: 89 mg/dL — ABNORMAL LOW (ref 100–199)
HDL: 41 mg/dL (ref >39)
LDL Chol Calc (NIH): 37 mg/dL (ref 0–99)
Triglycerides: 38 mg/dL (ref 0–149)
VLDL Cholesterol Cal: 11 mg/dL (ref 5–40)

## 2019-12-29 NOTE — Progress Notes (Signed)
Daily Session Note  Patient Details  Name: Connor Tyler MRN: [id number] Date of Birth: 03/15/1946 Referring Provider:     Cardiac Rehab from 11/22/2019 in [facility] Cardiac and Pulmonary Rehab  Referring Provider  Fletcher Anon      Encounter Date: 12/29/2019  Check In: Session Check In - 12/29/19 0843      Check-In   Supervising physician immediately available to respond to emergencies  See telemetry face sheet for immediately available ER MD    Location  [facility]-Cardiac & Pulmonary Rehab    Staff Present  Alberteen Sam, MA, RCEP, CCRP, CCET;Joseph Hood RCP,RRT,BSRT;Melissa Gwinner RDN, LDN;Other   Basilia Jumbo RN,BSN   Virtual Visit  No    Medication changes reported      No    Fall or balance concerns reported     No    Warm-up and Cool-down  Performed on first and last piece of equipment    Resistance Training Performed  Yes    VAD Patient?  No    PAD/SET Patient?  No      Pain Assessment   Currently in Pain?  No/denies          Social History   Tobacco Use  Smoking Status Former Smoker  . Packs/day: 1.00  . Years: 13.00  . Pack years: 13.00  . Types: Cigarettes  . Quit date: 35  . Years since quitting: 49.3  Smokeless Tobacco Never Used    Goals Met:  Independence with exercise equipment Exercise tolerated well No report of cardiac concerns or symptoms  Goals Unmet:  Not Applicable  Comments: Pt able to follow exercise prescription today without complaint.  Will continue to monitor for progression.  Reviewed home exercise with pt today.  Pt plans to use home gym equipment and continue walking at home for exercise.  Reviewed THR, pulse, RPE, sign and symptoms, NTG use, and when to call 911 or MD.  Also discussed weather considerations and indoor options.  Pt voiced understanding.   Dr. Emily Filbert is Medical Director for [place] and [name].

## 2020-01-01 ENCOUNTER — Other Ambulatory Visit: Payer: Self-pay

## 2020-01-01 ENCOUNTER — Encounter: Payer: Medicare Other | Admitting: *Deleted

## 2020-01-01 DIAGNOSIS — I214 Non-ST elevation (NSTEMI) myocardial infarction: Secondary | ICD-10-CM

## 2020-01-01 DIAGNOSIS — Z955 Presence of coronary angioplasty implant and graft: Secondary | ICD-10-CM

## 2020-01-01 NOTE — Progress Notes (Signed)
Daily Session Note  Patient Details  Name: Connor Tyler MRN: [id number] Date of Birth: 07/11/1946 Referring Provider:     Cardiac Rehab from 11/22/2019 in [facility] Cardiac and Pulmonary Rehab  Referring Provider  Fletcher Anon      Encounter Date: 01/01/2020  Check In: Session Check In - 01/01/20 1696      Check-In   Supervising physician immediately available to respond to emergencies  See telemetry face sheet for immediately available ER MD    Location  [facility]-Cardiac & Pulmonary Rehab    Staff Present  Heath Lark, RN, BSN, Laveda Norman, BS, ACSM CEP, Exercise Physiologist;Joseph Tessie Fass RCP,RRT,BSRT    Virtual Visit  No    Medication changes reported      No    Fall or balance concerns reported     No    Warm-up and Cool-down  Performed on first and last piece of equipment    Resistance Training Performed  Yes    VAD Patient?  No    PAD/SET Patient?  No      Pain Assessment   Currently in Pain?  No/denies          Social History   Tobacco Use  Smoking Status Former Smoker  . Packs/day: 1.00  . Years: 13.00  . Pack years: 13.00  . Types: Cigarettes  . Quit date: 15  . Years since quitting: 49.3  Smokeless Tobacco Never Used    Goals Met:  Independence with exercise equipment Exercise tolerated well No report of cardiac concerns or symptoms  Goals Unmet:  Not Applicable  Comments: Pt able to follow exercise prescription today without complaint.  Will continue to monitor for progression.    Dr. Emily Filbert is Medical Director for [place] and [name].

## 2020-01-03 ENCOUNTER — Encounter: Payer: Medicare Other | Admitting: *Deleted

## 2020-01-03 ENCOUNTER — Other Ambulatory Visit: Payer: Self-pay

## 2020-01-03 DIAGNOSIS — I214 Non-ST elevation (NSTEMI) myocardial infarction: Secondary | ICD-10-CM

## 2020-01-03 DIAGNOSIS — Z955 Presence of coronary angioplasty implant and graft: Secondary | ICD-10-CM

## 2020-01-03 NOTE — Progress Notes (Signed)
Daily Session Note  Patient Details  Name: Connor Tyler MRN: [id number] Date of Birth: 11-29-1945 Referring Provider:     Cardiac Rehab from 11/22/2019 in [facility] Cardiac and Pulmonary Rehab  Referring Provider  Fletcher Anon      Encounter Date: 01/03/2020  Check In: Session Check In - 01/03/20 0809      Check-In   Supervising physician immediately available to respond to emergencies  See telemetry face sheet for immediately available ER MD    Location  [facility]-Cardiac & Pulmonary Rehab    Staff Present  Nada Maclachlan, BA, ACSM CEP, Exercise Physiologist;Joseph Flavia Shipper;Heath Lark, RN, BSN, CCRP    Virtual Visit  No    Medication changes reported      No    Fall or balance concerns reported     No    Warm-up and Cool-down  Performed on first and last piece of equipment    Resistance Training Performed  Yes    VAD Patient?  No    PAD/SET Patient?  No      Pain Assessment   Currently in Pain?  No/denies          Social History   Tobacco Use  Smoking Status Former Smoker  . Packs/day: 1.00  . Years: 13.00  . Pack years: 13.00  . Types: Cigarettes  . Quit date: 32  . Years since quitting: 49.3  Smokeless Tobacco Never Used    Goals Met:  Independence with exercise equipment Exercise tolerated well No report of cardiac concerns or symptoms  Goals Unmet:  Not Applicable  Comments: Pt able to follow exercise prescription today without complaint.  Will continue to monitor for progression.    Dr. Emily Filbert is Medical Director for [place] and [name].

## 2020-01-05 ENCOUNTER — Other Ambulatory Visit: Payer: Self-pay

## 2020-01-05 DIAGNOSIS — I214 Non-ST elevation (NSTEMI) myocardial infarction: Secondary | ICD-10-CM

## 2020-01-05 DIAGNOSIS — Z955 Presence of coronary angioplasty implant and graft: Secondary | ICD-10-CM

## 2020-01-05 NOTE — Progress Notes (Signed)
Daily Session Note  Patient Details  Name: Connor Tyler MRN: [id number] Date of Birth: Nov 01, 1945 Referring Provider:     Cardiac Rehab from 11/22/2019 in [facility] Cardiac and Pulmonary Rehab  Referring Provider  Fletcher Anon      Encounter Date: 01/05/2020  Check In: Session Check In - 01/05/20 0853      Check-In   Supervising physician immediately available to respond to emergencies  See telemetry face sheet for immediately available ER MD    Location  [facility]-Cardiac & Pulmonary Rehab    Staff Present  Vida Rigger RN, BSN;Jessica Luan Pulling, MA, RCEP, CCRP, CCET;Joseph Hood RCP,RRT,BSRT    Virtual Visit  No    Medication changes reported      No    Fall or balance concerns reported     No    Warm-up and Cool-down  Performed on first and last piece of equipment    Resistance Training Performed  Yes    VAD Patient?  No    PAD/SET Patient?  No      Pain Assessment   Currently in Pain?  No/denies          Social History   Tobacco Use  Smoking Status Former Smoker  . Packs/day: 1.00  . Years: 13.00  . Pack years: 13.00  . Types: Cigarettes  . Quit date: 65  . Years since quitting: 49.3  Smokeless Tobacco Never Used    Goals Met:  Independence with exercise equipment Exercise tolerated well No report of cardiac concerns or symptoms Strength training completed today  Goals Unmet:  Not Applicable  Comments: Pt able to follow exercise prescription today without complaint.  Will continue to monitor for progression.   Dr. Emily Filbert is Medical Director for [place] and [name].

## 2020-01-08 ENCOUNTER — Other Ambulatory Visit: Payer: Self-pay

## 2020-01-08 ENCOUNTER — Encounter: Payer: Medicare Other | Attending: Cardiovascular Disease | Admitting: *Deleted

## 2020-01-08 DIAGNOSIS — Z955 Presence of coronary angioplasty implant and graft: Secondary | ICD-10-CM | POA: Diagnosis not present

## 2020-01-08 DIAGNOSIS — I214 Non-ST elevation (NSTEMI) myocardial infarction: Secondary | ICD-10-CM

## 2020-01-08 DIAGNOSIS — I252 Old myocardial infarction: Secondary | ICD-10-CM | POA: Insufficient documentation

## 2020-01-08 NOTE — Progress Notes (Signed)
Daily Session Note  Patient Details  Name: BRYNN REZNIK MRN: [id number] Date of Birth: September 10, 1945 Referring Provider:     Cardiac Rehab from 11/22/2019 in [facility] Cardiac and Pulmonary Rehab  Referring Provider  Fletcher Anon      Encounter Date: 01/08/2020  Check In: Session Check In - 01/08/20 6812      Check-In   Supervising physician immediately available to respond to emergencies  See telemetry face sheet for immediately available ER MD    Staff Present  Heath Lark, RN, BSN, CCRP;Joseph Tedd Sias, [place], ACSM CEP, Exercise Physiologist    Virtual Visit  No    Medication changes reported      No    Fall or balance concerns reported     No    Warm-up and Cool-down  Performed on first and last piece of equipment    Resistance Training Performed  Yes    VAD Patient?  No    PAD/SET Patient?  No      Pain Assessment   Currently in Pain?  No/denies          Social History   Tobacco Use  Smoking Status Former Smoker  . Packs/day: 1.00  . Years: 13.00  . Pack years: 13.00  . Types: Cigarettes  . Quit date: 66  . Years since quitting: 49.3  Smokeless Tobacco Never Used    Goals Met:  Independence with exercise equipment Exercise tolerated well No report of cardiac concerns or symptoms  Goals Unmet:  Not Applicable  Comments: Pt able to follow exercise prescription today without complaint.  Will continue to monitor for progression.    Dr. Emily Filbert is Medical Director for [place] and [name].

## 2020-01-10 ENCOUNTER — Other Ambulatory Visit: Payer: Self-pay

## 2020-01-10 ENCOUNTER — Encounter: Payer: Medicare Other | Admitting: *Deleted

## 2020-01-10 DIAGNOSIS — I252 Old myocardial infarction: Secondary | ICD-10-CM | POA: Diagnosis not present

## 2020-01-10 DIAGNOSIS — I214 Non-ST elevation (NSTEMI) myocardial infarction: Secondary | ICD-10-CM

## 2020-01-10 NOTE — Progress Notes (Signed)
Daily Session Note  Patient Details  Name: Connor Tyler MRN: [id number] Date of Birth: 1946-04-26 Referring Provider:     Cardiac Rehab from 11/22/2019 in [facility] Cardiac and Pulmonary Rehab  Referring Provider  Fletcher Anon      Encounter Date: 01/10/2020  Check In: Session Check In - 01/10/20 4128      Check-In   Supervising physician immediately available to respond to emergencies  See telemetry face sheet for immediately available ER MD    Location  [facility]-Cardiac & Pulmonary Rehab    Staff Present  Alberteen Sam, MA, RCEP, CCRP, CCET;Amanda Sommer, [place], ACSM CEP, Exercise Physiologist;Joseph Hood RCP,RRT,BSRT;Melissa [place] RDN, LDN   Basilia Jumbo RN, BSN   Virtual Visit  No    Medication changes reported      No    Fall or balance concerns reported     No    Warm-up and Cool-down  Performed on first and last piece of equipment    Resistance Training Performed  Yes    VAD Patient?  No    PAD/SET Patient?  No      Pain Assessment   Currently in Pain?  No/denies          Social History   Tobacco Use  Smoking Status Former Smoker  . Packs/day: 1.00  . Years: 13.00  . Pack years: 13.00  . Types: Cigarettes  . Quit date: 43  . Years since quitting: 49.3  Smokeless Tobacco Never Used    Goals Met:  Independence with exercise equipment Exercise tolerated well No report of cardiac concerns or symptoms  Goals Unmet:  Not Applicable  Comments: Pt able to follow exercise prescription today without complaint.  Will continue to monitor for progression.    Dr. Emily Filbert is Medical Director for [place] and [name].

## 2020-01-12 ENCOUNTER — Other Ambulatory Visit: Payer: Self-pay

## 2020-01-12 ENCOUNTER — Encounter: Payer: Medicare Other | Admitting: *Deleted

## 2020-01-12 DIAGNOSIS — Z955 Presence of coronary angioplasty implant and graft: Secondary | ICD-10-CM

## 2020-01-12 DIAGNOSIS — I214 Non-ST elevation (NSTEMI) myocardial infarction: Secondary | ICD-10-CM

## 2020-01-12 DIAGNOSIS — I252 Old myocardial infarction: Secondary | ICD-10-CM | POA: Diagnosis not present

## 2020-01-12 NOTE — Progress Notes (Signed)
Daily Session Note  Patient Details  Name: Connor Tyler MRN: [id number] Date of Birth: 1946-01-15 Referring Provider:     Cardiac Rehab from 11/22/2019 in [facility] Cardiac and Pulmonary Rehab  Referring Provider  Fletcher Anon      Encounter Date: 01/12/2020  Check In: Session Check In - 01/12/20 2761      Check-In   Supervising physician immediately available to respond to emergencies  See telemetry face sheet for immediately available ER MD    Location  [facility]Cardiac & Pulmonary Rehab    Staff Present  Heath Lark, RN, BSN, CCRP;Joseph Hood RCP,RRT,BSRT;Jessica [place], [place], [place], [place], CCET    Virtual Visit  No    Medication changes reported      No    Fall or balance concerns reported     No    Warm-up and Cool-down  Performed on first and last piece of equipment    Resistance Training Performed  Yes    VAD Patient?  No    PAD/SET Patient?  No      Pain Assessment   Currently in Pain?  No/denies          Social History   Tobacco Use  Smoking Status Former Smoker  . Packs/day: 1.00  . Years: 13.00  . Pack years: 13.00  . Types: Cigarettes  . Quit date: 5  . Years since quitting: 49.3  Smokeless Tobacco Never Used    Goals Met:  Independence with exercise equipment Exercise tolerated well No report of cardiac concerns or symptoms  Goals Unmet:  Not Applicable  Comments: Pt able to follow exercise prescription today without complaint.  Will continue to monitor for progression.    Dr. Emily Filbert is Medical Director for [place] and [name].

## 2020-01-15 ENCOUNTER — Other Ambulatory Visit: Payer: Self-pay

## 2020-01-15 ENCOUNTER — Encounter: Payer: Medicare Other | Admitting: *Deleted

## 2020-01-15 DIAGNOSIS — I252 Old myocardial infarction: Secondary | ICD-10-CM | POA: Diagnosis not present

## 2020-01-15 DIAGNOSIS — I214 Non-ST elevation (NSTEMI) myocardial infarction: Secondary | ICD-10-CM

## 2020-01-15 DIAGNOSIS — Z955 Presence of coronary angioplasty implant and graft: Secondary | ICD-10-CM

## 2020-01-15 NOTE — Progress Notes (Signed)
Daily Session Note  Patient Details  Name: SUPREME RYBARCZYK MRN: [id number] Date of Birth: 1946-06-09 Referring Provider:     Cardiac Rehab from 11/22/2019 in [facility] Cardiac and Pulmonary Rehab  Referring Provider  Fletcher Anon      Encounter Date: 01/15/2020  Check In: Session Check In - 01/15/20 4585      Check-In   Supervising physician immediately available to respond to emergencies  See telemetry face sheet for immediately available ER MD    Location  [facility]Cardiac [facility] Pul[facility] Re[facility]    Staff Present  Heath Lark, RN, BSN, CCRP;Joseph Hood RCP,RRT,BSRT;Kelly [address], [place], ACSM CEP, Exercise Physiologist    Virtual Visit  No    Medication changes reported      No    Fall or balance concerns reported     No    Warm-up and Cool-down  Performed on first and last piece of equipment    Resistance Training Performed  No    VAD Patient?  No      Pain Assessment   Currently in Pain?  No/denies          Social History   Tobacco Use  Smoking Status Former Smoker  . Packs/day: 1.00  . Years: 13.00  . Pack years: 13.00  . Types: Cigarettes  . Quit date: 50  . Years since quitting: 49.3  Smokeless Tobacco Never Used    Goals Met:  Independence with exercise equipment Exercise tolerated well No report of cardiac concerns or symptoms  Goals Unmet:  Not Applicable  Comments: Pt able to follow exercise prescription today without complaint.  Will continue to monitor for progression.    Dr. Emily Filbert is Medical Director for [place] and [name].

## 2020-01-17 ENCOUNTER — Encounter: Payer: Medicare Other | Admitting: *Deleted

## 2020-01-17 ENCOUNTER — Other Ambulatory Visit: Payer: Self-pay

## 2020-01-17 DIAGNOSIS — I252 Old myocardial infarction: Secondary | ICD-10-CM | POA: Diagnosis not present

## 2020-01-17 DIAGNOSIS — Z955 Presence of coronary angioplasty implant and graft: Secondary | ICD-10-CM

## 2020-01-17 DIAGNOSIS — I214 Non-ST elevation (NSTEMI) myocardial infarction: Secondary | ICD-10-CM

## 2020-01-17 NOTE — Progress Notes (Signed)
Daily Session Note  Patient Details  Name: Connor Tyler MRN: [id number] Date of Birth: 1946-06-26 Referring Provider:     Cardiac Rehab from 11/22/2019 in [facility] Cardiac and Pulmonary Rehab  Referring Provider  Fletcher Anon      Encounter Date: 01/17/2020  Check In: Session Check In - 01/17/20 0838      Check-In   Supervising physician immediately available to respond to emergencies  See telemetry face sheet for immediately available ER MD    Location  [facility]Cardiac [facility] Pulmonary Rehab    Staff Present  Heath Lark, RN, BSN, CCRP;Joseph Hood RCP,RRT,BSRT;Jessica [place], [place], [place], [place], CCET    Virtual Visit  No    Medication changes reported      No    Fall or balance concerns reported     No    Warm-up and Cool-down  Performed on first and last piece of equipment    Resistance Training Performed  Yes    VAD Patient?  No    PAD/SET Patient?  No      Pain Assessment   Currently in Pain?  No/denies          Social History   Tobacco Use  Smoking Status Former Smoker  . Packs/day: 1.00  . Years: 13.00  . Pack years: 13.00  . Types: Cigarettes  . Quit date: 65  . Years since quitting: 49.3  Smokeless Tobacco Never Used    Goals Met:  Independence with exercise equipment Exercise tolerated well No report of cardiac concerns or symptoms  Goals Unmet:  Not Applicable  Comments: Pt able to follow exercise prescription today without complaint.  Will continue to monitor for progression.    Dr. Emily Filbert is Medical Director for [place] and [name].

## 2020-01-19 ENCOUNTER — Other Ambulatory Visit: Payer: Self-pay

## 2020-01-19 ENCOUNTER — Encounter: Payer: Medicare Other | Admitting: *Deleted

## 2020-01-19 DIAGNOSIS — I252 Old myocardial infarction: Secondary | ICD-10-CM | POA: Diagnosis not present

## 2020-01-19 DIAGNOSIS — I214 Non-ST elevation (NSTEMI) myocardial infarction: Secondary | ICD-10-CM

## 2020-01-19 NOTE — Progress Notes (Signed)
Daily Session Note  Patient Details  Name: Connor Tyler MRN: [id number] Date of Birth: 10-Mar-1946 Referring Provider:     Cardiac Rehab from 11/22/2019 in [facility] Cardiac and Pulmonary Rehab  Referring Provider  Fletcher Anon      Encounter Date: 01/19/2020  Check In: Session Check In - 01/19/20 0843      Check-In   Supervising physician immediately available to respond to emergencies  See telemetry face sheet for immediately available ER MD    Location  [facility]-Cardiac & Pulmonary Rehab    Staff Present  Basilia Jumbo, RN, BSN;Joseph [address] [place], [place], [place], [place], CCET    Virtual Visit  No    Medication changes reported      No    Fall or balance concerns reported     No    Warm-up and Cool-down  Performed on first and last piece of equipment    Resistance Training Performed  Yes    VAD Patient?  No    PAD/SET Patient?  No      Pain Assessment   Currently in Pain?  No/denies          Social History   Tobacco Use  Smoking Status Former Smoker  . Packs/day: 1.00  . Years: 13.00  . Pack years: 13.00  . Types: Cigarettes  . Quit date: 17  . Years since quitting: 49.4  Smokeless Tobacco Never Used    Goals Met:  Independence with exercise equipment Exercise tolerated well No report of cardiac concerns or symptoms  Goals Unmet:  Not Applicable  Comments: Pt able to follow exercise prescription today without complaint.  Will continue to monitor for progression.    Dr. Emily Filbert is Medical Director for [place] and [name].

## 2020-01-22 ENCOUNTER — Encounter: Payer: Medicare Other | Admitting: *Deleted

## 2020-01-22 ENCOUNTER — Other Ambulatory Visit: Payer: Self-pay

## 2020-01-22 DIAGNOSIS — I252 Old myocardial infarction: Secondary | ICD-10-CM | POA: Diagnosis not present

## 2020-01-22 DIAGNOSIS — Z955 Presence of coronary angioplasty implant and graft: Secondary | ICD-10-CM

## 2020-01-22 DIAGNOSIS — I214 Non-ST elevation (NSTEMI) myocardial infarction: Secondary | ICD-10-CM

## 2020-01-22 NOTE — Progress Notes (Signed)
Daily Session Note  Patient Details  Name: Connor Tyler MRN: [id number] Date of Birth: [birth year] Referring Provider:     Cardiac Rehab from 11/22/2019 in [facility] Cardiac and Pulmonary Rehab  Referring Provider  Fletcher Anon      Encounter Date: 01/22/2020  Check In: Session Check In - 01/22/20 0826      Check-In   Supervising physician immediately available to respond to emergencies  See telemetry face sheet for immediately available ER MD    Location  [facility]-Cardiac & Pulmonary Rehab    Staff Present  Heath Lark, RN, BSN, Laveda Norman, BS, ACSM CEP, Exercise Physiologist;Joseph Tessie Fass RCP,RRT,BSRT    Virtual Visit  No    Medication changes reported      No    Fall or balance concerns reported     No    Warm-up and Cool-down  Performed on first and last piece of equipment    Resistance Training Performed  Yes    VAD Patient?  No    PAD/SET Patient?  No      Pain Assessment   Currently in Pain?  No/denies          Social History   Tobacco Use  Smoking Status Former Smoker  . Packs/day: 1.00  . Years: 13.00  . Pack years: 13.00  . Types: Cigarettes  . Quit date: 70  . Years since quitting: 49.4  Smokeless Tobacco Never Used    Goals Met:  Independence with exercise equipment Exercise tolerated well No report of cardiac concerns or symptoms  Goals Unmet:  Not Applicable  Comments: Pt able to follow exercise prescription today without complaint.  Will continue to monitor for progression.    Dr. Emily Filbert is Medical Director for [place] and [name].

## 2020-01-24 ENCOUNTER — Encounter: Payer: Medicare Other | Admitting: *Deleted

## 2020-01-24 ENCOUNTER — Other Ambulatory Visit: Payer: Self-pay

## 2020-01-24 ENCOUNTER — Encounter: Payer: Self-pay | Admitting: *Deleted

## 2020-01-24 DIAGNOSIS — I252 Old myocardial infarction: Secondary | ICD-10-CM | POA: Diagnosis not present

## 2020-01-24 DIAGNOSIS — I214 Non-ST elevation (NSTEMI) myocardial infarction: Secondary | ICD-10-CM

## 2020-01-24 DIAGNOSIS — Z955 Presence of coronary angioplasty implant and graft: Secondary | ICD-10-CM

## 2020-01-24 NOTE — Progress Notes (Signed)
Daily Session Note  Patient Details  Name: Connor Tyler MRN: [id number] Date of Birth: Apr 27, 1946 Referring Provider:     Cardiac Rehab from 11/22/2019 in [facility] Cardiac and Pulmonary Rehab  Referring Provider  Fletcher Anon      Encounter Date: 01/24/2020  Check In: Session Check In - 01/24/20 8938      Check-In   Supervising physician immediately available to respond to emergencies  See telemetry face sheet for immediately available ER MD    Location  [facility]Cardiac [facility] Pulmonary Rehab    Staff Present  Heath Lark, RN, BSN, Lance Sell, BA, ACSM CEP, Exercise Physiologist;Joseph Hood RCP,RRT,BSRT    Virtual Visit  No    Medication changes reported      No    Fall or balance concerns reported     No    Warm-up and Cool-down  Performed on first and last piece of equipment    Resistance Training Performed  Yes    VAD Patient?  No    PAD/SET Patient?  No      Pain Assessment   Currently in Pain?  No/denies          Social History   Tobacco Use  Smoking Status Former Smoker  . Packs/day: 1.00  . Years: 13.00  . Pack years: 13.00  . Types: Cigarettes  . Quit date: 41  . Years since quitting: 49.4  Smokeless Tobacco Never Used    Goals Met:  Independence with exercise equipment Exercise tolerated well No report of cardiac concerns or symptoms  Goals Unmet:  Not Applicable  Comments: Pt able to follow exercise prescription today without complaint.  Will continue to monitor for progression.    Dr. Emily Filbert is Medical Director for [place] and [name].

## 2020-01-24 NOTE — Progress Notes (Signed)
Cardiac Individual Treatment Plan  Patient Details  Name: Connor Tyler MRN: [id number] Date of Birth: 16-Jun-1946 Referring Provider:     Cardiac Rehab from 11/22/2019 in [facility] Cardiac and Pulmonary Rehab  Referring Provider  Arida      Initial Encounter Date:    Cardiac Rehab from 11/22/2019 in [facility] Cardiac and Pulmonary Rehab  Date  11/22/19      Visit Diagnosis: NSTEMI (non-ST elevation myocardial infarction) [facility])  Status post coronary artery stent placement  Patient's Home Medications on Admission:  Current Outpatient Medications:  .  albuterol (PROAIR HFA) 108 (90 BASE) MCG/ACT inhaler, Inhale 2 puffs into the lungs every 4 (four) hours as needed for wheezing or shortness of breath. , Disp: , Rfl:  .  amLODipine (NORVASC) 10 MG tablet, Take 1 tablet (10 mg total) by mouth daily., Disp: 30 tablet, Rfl: 6 .  aspirin EC 81 MG tablet, Take 81 mg by mouth daily. , Disp: , Rfl:  .  atorvastatin (LIPITOR) 80 MG tablet, Take 1 tablet (80 mg total) by mouth daily., Disp: 30 tablet, Rfl: 6 .  carvedilol (COREG) 6.25 MG tablet, Take 1 tablet (6.25 mg total) by mouth 2 (two) times daily., Disp: 60 tablet, Rfl: 6 .  cetirizine (ZYRTEC) 10 MG tablet, Take 10 mg by mouth daily. , Disp: , Rfl:  .  Coenzyme Q10 10 MG capsule, Take 10 mg by mouth daily. , Disp: , Rfl:  .  lisinopril (PRINIVIL,ZESTRIL) 40 MG tablet, TAKE 1 TABLET ONE TIME DAILY, Disp: , Rfl:  .  Melatonin 5 MG CAPS, Take 10 mg by mouth at bedtime. , Disp: , Rfl:  .  Multiple Vitamins-Minerals (ZINC PO), Take by mouth. Taking 1 capsule daily, Disp: , Rfl:  .  nitroGLYCERIN (NITROSTAT) 0.4 MG SL tablet, Place 1 tablet (0.4 mg total) under the tongue every 5 (five) minutes as needed for chest pain., Disp: 25 tablet, Rfl: 3 .  pantoprazole (PROTONIX) 40 MG tablet, Take 1 tablet (40 mg total) by mouth daily., Disp: 30 tablet, Rfl: 6 .  phenylephrine-shark liver oil-mineral oil-petrolatum (PREPARATION H) 0.25-3-14-71.9 % rectal  ointment, Place 1 application rectally 2 (two) times daily as needed for hemorrhoids., Disp: , Rfl:  .  ticagrelor (BRILINTA) 90 MG TABS tablet, Take 1 tablet (90 mg total) by mouth 2 (two) times daily., Disp: 60 tablet, Rfl: 6 .  vitamin B-12 (CYANOCOBALAMIN) 500 MCG tablet, Take 500 mcg by mouth daily., Disp: , Rfl:  .  vitamin C (ASCORBIC ACID) 500 MG tablet, Take 500 mg by mouth daily., Disp: , Rfl:   Past Medical History: Past Medical History:  Diagnosis Date  . Allergic rhinitis   . Anemia   . Asthma   . Coronary artery disease 11/11   Diffuse mild 3 vessell CAD by cardiac cath @ [facility]  . ED (erectile dysfunction)   . GERD (gastroesophageal reflux disease)   . Heart murmur   . History of MRSA infection   . Hypertension   . Mitral regurgitation   . Valvular heart disease     Tobacco Use: Social History   Tobacco Use  Smoking Status Former Smoker  . Packs/day: 1.00  . Years: 13.00  . Pack years: 13.00  . Types: Cigarettes  . Quit date: 36  . Years since quitting: 49.4  Smokeless Tobacco Never Used    Labs: Recent Review Scientist, physiological    Labs for ITP Cardiac and Pulmonary Rehab Latest Ref Rng & Units 12/28/2019  Cholestrol 100 - 199 mg/dL 89(L)   LDLCALC 0 - 99 mg/dL 37   HDL >39 mg/dL 41   Trlycerides 0 - 149 mg/dL 38       Exercise Target Goals: Exercise Program Goal: Individual exercise prescription set using results from initial 6 min walk test and THRR while considering  patient's activity barriers and safety.   Exercise Prescription Goal: Initial exercise prescription builds to 30-45 minutes a day of aerobic activity, 2-3 days per week.  Home exercise guidelines will be given to patient during program as part of exercise prescription that the participant will acknowledge.   Education: Aerobic Exercise & Resistance Training: - Gives group verbal and written instruction on the various components of exercise. Focuses on aerobic and  resistive training programs and the benefits of this training and how to safely progress through these programs..   Education: Exercise & Equipment Safety: - Individual verbal instruction and demonstration of equipment use and safety with use of the equipment.   Cardiac Rehab from 11/22/2019 in [facility] Cardiac and Pulmonary Rehab  Date  11/22/19  Educator  AS  Instruction Review Code  1- Verbalizes Understanding      Education: Exercise Physiology & General Exercise Guidelines: - Group verbal and written instruction with models to review the exercise physiology of the cardiovascular system and associated critical values. Provides general exercise guidelines with specific guidelines to those with heart or lung disease.    Education: Flexibility, Balance, Mind/Body Relaxation: Provides group verbal/written instruction on the benefits of flexibility and balance training, including mind/body exercise modes such as yoga, pilates and tai chi.  Demonstration and skill practice provided.   Activity Barriers & Risk Stratification:   6 Minute Walk: 6 Minute Walk    Row Name 11/22/19 1311         6 Minute Walk   Phase  Initial     Distance  1698 feet     Walk Time  6 minutes     # of Rest Breaks  0     MPH  3.2     METS  3.33     RPE  9     Perceived Dyspnea   0     VO2 Peak  11.7     Symptoms  No     Resting HR  78 bpm     Resting BP  134/66     Resting Oxygen Saturation   96 %     Exercise Oxygen Saturation  during 6 min walk  98 %     Max Ex. HR  104 bpm     Max Ex. BP  122/56     2 Minute Post BP  116/60        Oxygen Initial Assessment:   Oxygen Re-Evaluation:   Oxygen Discharge (Final Oxygen Re-Evaluation):   Initial Exercise Prescription: Initial Exercise Prescription - 11/22/19 1300      Date of Initial Exercise RX and Referring Provider   Date  11/22/19    Referring Provider  Arida      Treadmill   MPH  3    Grade  0    Minutes  15    METs  3.33       Recumbant Bike   Level  3    RPM  60    Watts  38    Minutes  15    METs  3.33      Elliptical   Level  1  Speed  3    Minutes  15      REL-XR   Level  3    Speed  50    Minutes  15    METs  3.3      Prescription Details   Frequency (times per week)  3    Duration  Progress to 30 minutes of continuous aerobic without signs/symptoms of physical distress      Intensity   THRR 40-80% of Max Heartrate  106-134    Ratings of Perceived Exertion  11-15    Perceived Dyspnea  0-4      Resistance Training   Training Prescription  Yes    Weight  6 lb    Reps  10-15       Perform Capillary Blood Glucose checks as needed.  Exercise Prescription Changes: Exercise Prescription Changes    Row Name 11/22/19 1300 12/06/19 1600 12/22/19 1000 12/29/19 0800 01/03/20 1400     Response to Exercise   Blood Pressure (Admit)  134/66  110/70  130/64  --  122/70   Blood Pressure (Exercise)  122/56  122/70  158/64  --  150/88   Blood Pressure (Exit)  116/60  128/82  120/60  --  136/64   Heart Rate (Admit)  78 bpm  77 bpm  79 bpm  --  74 bpm   Heart Rate (Exercise)  104 bpm  102 bpm  107 bpm  --  113 bpm   Heart Rate (Exit)  86 bpm  88 bpm  91 bpm  --  92 bpm   Oxygen Saturation (Admit)  96 %  --  --  --  --   Oxygen Saturation (Exercise)  98 %  --  --  --  --   Rating of Perceived Exertion (Exercise)  _0 --  14   Perceived Dyspnea (Exercise)  0  --  --  --  --   Symptoms  none  none  none  --  none   Duration  --  Continue with 30 min of aerobic exercise without signs/symptoms of physical distress.  Continue with 30 min of aerobic exercise without signs/symptoms of physical distress.  --  Continue with 30 min of aerobic exercise without signs/symptoms of physical distress.   Intensity  --  THRR unchanged  THRR unchanged  --  THRR unchanged     Progression   Progression  --  Continue to progress workloads to maintain intensity without signs/symptoms of physical distress.  Continue  to progress workloads to maintain intensity without signs/symptoms of physical distress.  --  Continue to progress workloads to maintain intensity without signs/symptoms of physical distress.   Average METs  --  3.8  4.27  --  4.4     Resistance Training   Training Prescription  --  Yes  Yes  --  Yes   Weight  --  6 lb  6 lb  --  10 lb   Reps  --  10-15  10-15  --  10-15     Interval Training   Interval Training  --  No  No  --  --     Treadmill   MPH  --  3.1  3.4  --  3.4   Grade  --  1  3  --  3   Minutes  --  15  15  --  15   METs  --  3.8  5.01  --  5.01     REL-XR   Level  --  3  4  --  --   Minutes  --  15  15  --  --   METs  --  3.8  4.4  --  --     T5 Nustep   Level  --  --  4  --  5   SPM  --  --  --  --  80   Minutes  --  --  15  --  15   METs  --  --  3.4  --  3.8     Home Exercise Plan   Plans to continue exercise at  --  --  --  Home (comment) walking, [employer] [facility], staff videos  Home (comment) walking, [employer], staff videos   Frequency  --  --  --  Add 2 additional days to program exercise sessions.  Add 2 additional days to program exercise sessions.   Initial Home Exercises Provided  --  --  --  12/29/19  12/29/19   Row Name 01/17/20 1200             Response to Exercise   Blood Pressure (Admit)  136/64       Blood Pressure (Exercise)  162/60       Blood Pressure (Exit)  136/60       Heart Rate (Admit)  74 bpm       Heart Rate (Exercise)  112 bpm       Heart Rate (Exit)  105 bpm       Rating of Perceived Exertion (Exercise)  15       Symptoms  none       Duration  Continue with 30 min of aerobic exercise without signs/symptoms of physical distress.       Intensity  THRR unchanged         Progression   Progression  Continue to progress workloads to maintain intensity without signs/symptoms of physical distress.       Average METs  3.76         Resistance Training   Training Prescription  Yes       Weight  10 lb       Reps  10-15         Interval  Training   Interval Training  No         Treadmill   MPH  3.5       Grade  3.5       Minutes  15       METs  5.35         Elliptical   Level  1       Speed  3.5       Minutes  15       METs  3         T5 Nustep   Level  5       Minutes  15       METs  2.9         Home Exercise Plan   Plans to continue exercise at  Home (comment) walking, [employer], staff videos       Frequency  Add 2 additional days to program exercise sessions.       Initial Home Exercises Provided  12/29/19          Exercise Comments:   Exercise Goals and Review: Exercise Goals    Row Name 11/22/19 1317  Exercise Goals   Increase Physical Activity  Yes       Intervention  Provide advice, education, support and counseling about physical activity/exercise needs.;Develop an individualized exercise prescription for aerobic and resistive training based on initial evaluation findings, risk stratification, comorbidities and participant's personal goals.       Expected Outcomes  Short Term: Attend rehab on a regular basis to increase amount of physical activity.;Long Term: Add in home exercise to make exercise part of routine and to increase amount of physical activity.;Long Term: Exercising regularly at least 3-5 days a week.       Increase Strength and Stamina  Yes       Intervention  Provide advice, education, support and counseling about physical activity/exercise needs.;Develop an individualized exercise prescription for aerobic and resistive training based on initial evaluation findings, risk stratification, comorbidities and participant's personal goals.       Expected Outcomes  Short Term: Increase workloads from initial exercise prescription for resistance, speed, and METs.;Short Term: Perform resistance training exercises routinely during rehab and add in resistance training at home;Long Term: Improve cardiorespiratory fitness, muscular endurance and strength as measured by increased METs and  functional capacity (6MWT)       Able to understand and use rate of perceived exertion (RPE) scale  Yes       Intervention  Provide education and explanation on how to use RPE scale       Expected Outcomes  Short Term: Able to use RPE daily in rehab to express subjective intensity level;Long Term:  Able to use RPE to guide intensity level when exercising independently       Able to understand and use Dyspnea scale  Yes       Intervention  Provide education and explanation on how to use Dyspnea scale       Expected Outcomes  Short Term: Able to use Dyspnea scale daily in rehab to express subjective sense of shortness of breath during exertion;Long Term: Able to use Dyspnea scale to guide intensity level when exercising independently       Knowledge and understanding of Target Heart Rate Range (THRR)  Yes       Intervention  Provide education and explanation of THRR including how the numbers were predicted and where they are located for reference       Expected Outcomes  Short Term: Able to state/look up THRR;Short Term: Able to use daily as guideline for intensity in rehab;Long Term: Able to use THRR to govern intensity when exercising independently       Able to check pulse independently  Yes       Intervention  Provide education and demonstration on how to check pulse in carotid and radial arteries.;Review the importance of being able to check your own pulse for safety during independent exercise       Expected Outcomes  Short Term: Able to explain why pulse checking is important during independent exercise;Long Term: Able to check pulse independently and accurately       Understanding of Exercise Prescription  Yes       Intervention  Provide education, explanation, and written materials on patient's individual exercise prescription       Expected Outcomes  Short Term: Able to explain program exercise prescription;Long Term: Able to explain home exercise prescription to exercise independently           Exercise Goals Re-Evaluation : Exercise Goals Re-Evaluation    Row Name 11/23/19 1201 12/04/19 1131 12/22/19 1037  12/29/19 0847 01/03/20 1446     Exercise Goal Re-Evaluation   Exercise Goals Review  Increase Physical Activity;Able to understand and use rate of perceived exertion (RPE) scale;Knowledge and understanding of Target Heart Rate Range (THRR);Understanding of Exercise Prescription;Increase Strength and Stamina;Able to check pulse independently  Increase Physical Activity;Increase Strength and Stamina;Able to understand and use rate of perceived exertion (RPE) scale;Understanding of Exercise Prescription  Increase Physical Activity;Increase Strength and Stamina;Understanding of Exercise Prescription  Increase Physical Activity;Increase Strength and Stamina;Understanding of Exercise Prescription;Able to understand and use rate of perceived exertion (RPE) scale;Able to understand and use Dyspnea scale;Knowledge and understanding of Target Heart Rate Range (THRR);Able to check pulse independently  Increase Physical Activity;Increase Strength and Stamina;Able to understand and use rate of perceived exertion (RPE) scale;Able to understand and use Dyspnea scale;Knowledge and understanding of Target Heart Rate Range (THRR);Able to check pulse independently;Understanding of Exercise Prescription   Comments  Reviewed RPE scale, THR and program prescription with pt today.  Pt voiced understanding and was given a copy of goals to take home.  Patient needed a better understanding of the RPE scale. Informed patient how to gauge how hard he is working. Patient verbalizes understanding. Richardson Landry is walking every afternoon two miles a day when he is not in [facility]. He wants to gain some strength while he is in the program.  Richardson Landry is doing well in rehab.  He is now able to play a full 18 holes and not get tired.  He was very excited about this and was even able to beat his partner!  He is now up to 3.4 mph on the  treadmill as well.  We will conitnue to monitor his progress.  Reviewed home exercise with pt today.  Pt plans to use home gym equipment and continue walking at home for exercise.  Reviewed THR, pulse, RPE, sign and symptoms, NTG use, and when to call 911 or MD.  Also discussed weather considerations and indoor options.  Pt voiced understanding.  Richardson Landry is making steady progress.  He has moved up to 10 lb for strength work.  Staff will monitor progress.   Expected Outcomes  Short: Use RPE daily to regulate intensity. Long: Follow program prescription in THR.  Short: work on [employer] for resistance exercise. Long: Exercise post [employer] with weights independently  Short: Continue to progress workloads  Long; Continue to build strength and stamina for golf.  Short: Start to add in exercise at home  Long: Continue to improve stamina.  Short: continue to attend consistently Long: improve overall MET level   Row Name 01/10/20 0844 01/17/20 1243           Exercise Goal Re-Evaluation   Exercise Goals Review  Increase Physical Activity;Increase Strength and Stamina;Able to understand and use rate of perceived exertion (RPE) scale;Able to understand and use Dyspnea scale;Knowledge and understanding of Target Heart Rate Range (THRR);Able to check pulse independently;Understanding of Exercise Prescription  Increase Physical Activity;Increase Strength and Stamina;Understanding of Exercise Prescription      Comments  Richardson Landry is feeling great - able to play golf without getting too fatigued.  he does exercise at home on days not at [facility].  Richardson Landry has been doing well in rehab.  He is up to 3.5 mph on the treadmill now and level 5 on the T5 NuStep.  We will continue to monitor his progression.      Expected Outcomes  Short: attend consistently Long:  improve MET level  Short: Continue to improve  endurance on elliptical  Long: Continue to improve stamina.         Discharge Exercise Prescription (Final Exercise  Prescription Changes): Exercise Prescription Changes - 01/17/20 1200      Response to Exercise   Blood Pressure (Admit)  136/64    Blood Pressure (Exercise)  162/60    Blood Pressure (Exit)  136/60    Heart Rate (Admit)  74 bpm    Heart Rate (Exercise)  112 bpm    Heart Rate (Exit)  105 bpm    Rating of Perceived Exertion (Exercise)  15    Symptoms  none    Duration  Continue with 30 min of aerobic exercise without signs/symptoms of physical distress.    Intensity  THRR unchanged      Progression   Progression  Continue to progress workloads to maintain intensity without signs/symptoms of physical distress.    Average METs  3.76      Resistance Training   Training Prescription  Yes    Weight  10 lb    Reps  10-15      Interval Training   Interval Training  No      Treadmill   MPH  3.5    Grade  3.5    Minutes  15    METs  5.35      Elliptical   Level  1    Speed  3.5    Minutes  15    METs  3      T5 Nustep   Level  5    Minutes  15    METs  2.9      Home Exercise Plan   Plans to continue exercise at  Home (comment)   walking, [employer], staff videos   Frequency  Add 2 additional days to program exercise sessions.    Initial Home Exercises Provided  12/29/19       Nutrition:  Target Goals: Understanding of nutrition guidelines, daily intake of sodium <1575m, cholesterol <2042m calories 30% from fat and 7% or less from saturated fats, daily to have 5 or more servings of fruits and vegetables.  Education: Controlling Sodium/Reading Food Labels -Group verbal and written material supporting the discussion of sodium use in heart healthy nutrition. Review and explanation with models, verbal and written materials for utilization of the food label.   Education: General Nutrition Guidelines/Fats and Fiber: -Group instruction provided by verbal, written material, models and posters to present the general guidelines for heart healthy nutrition. Gives an explanation  and review of dietary fats and fiber.   Biometrics: Pre Biometrics - 11/22/19 1317      Pre Biometrics   Height  5' 8.5" (1.74 m)    Weight  198 lb 14.4 oz (90.2 kg)    BMI (Calculated)  29.8    Single Leg Stand  30 seconds        Nutrition Therapy Plan and Nutrition Goals: Nutrition Therapy & Goals - 11/30/19 1123      Nutrition Therapy   Diet  HH, Low Na    Protein (specify units)  70-75g    Fiber  30 grams    Whole Grain Foods  3 servings    Saturated Fats  12 max. grams    Fruits and Vegetables  5 servings/day    Sodium  1.5 grams      Personal Nutrition Goals   Nutrition Goal  ST: add peanut butter to apple or banana snack LT: get back in  shape 6/10 now    Comments  B: bowl of oatmeal with milk (2%) and agave and cup of coffee (decaf). S: apple or banana D: fish, 2-3x chicken, steak 1x/week. Pt just bought 2 containers of crunchy jif, told pt he could use those, but to choose a peanut butter with just peanuts after finished. Discussed HH eating.      Intervention Plan   Intervention  Nutrition handout(s) given to patient.;Prescribe, educate and counsel regarding individualized specific dietary modifications aiming towards targeted core components such as weight, hypertension, lipid management, diabetes, heart failure and other comorbidities.    Expected Outcomes  Short Term Goal: Understand basic principles of dietary content, such as calories, fat, sodium, cholesterol and nutrients.;Short Term Goal: A plan has been developed with personal nutrition goals set during dietitian appointment.;Long Term Goal: Adherence to prescribed nutrition plan.       Nutrition Assessments: Nutrition Assessments - 11/22/19 1253      MEDFICTS Scores   Pre Score  100       MEDIFICTS Score Key:          ?70 Need to make dietary changes          40-70 Heart Healthy Diet         ? 40 Therapeutic Level Cholesterol Diet  Nutrition Goals Re-Evaluation: Nutrition Goals Re-Evaluation     [place] Name 01/03/20 0839             Goals   Nutrition Goal  ST: eat small snack, fat with dinner, or more at dinner so as to not get hungry at night.  LT: get back in shape 6/10 now       Comment  added peanut butter to apple or banana snack, pt reports that this has been helping with midday hunger and he doesn't get hungry again until dinner. Pt reports sometimes very hungry can't sleep about 2 hours after dinner, suggested having a small snack, adding some fat to dinner or eating a little past initial satisfaction to help prevent this. Pt reports losing weight, but his wife hasnt. Discussed set-point, weight retention differences between men and women as well as our body's natural weight maintenance mechanisms and added muscle tissue with exercise. Encouraged pt that these heart healthy lifestyle changes will help overall health independant of weight loss. Pt reports not wanting to make any addiitonal changes at this time.       Expected Outcome  ST: eat small snack, fat with dinner, or more at dinner so as to not get hungry at night.  LT: get back in shape 6/10 now          Nutrition Goals Discharge (Final Nutrition Goals Re-Evaluation): Nutrition Goals Re-Evaluation - 01/03/20 0839      Goals   Nutrition Goal  ST: eat small snack, fat with dinner, or more at dinner so as to not get hungry at night.  LT: get back in shape 6/10 now    Comment  added peanut butter to apple or banana snack, pt reports that this has been helping with midday hunger and he doesn't get hungry again until dinner. Pt reports sometimes very hungry can't sleep about 2 hours after dinner, suggested having a small snack, adding some fat to dinner or eating a little past initial satisfaction to help prevent this. Pt reports losing weight, but his wife hasnt. Discussed set-point, weight retention differences between men and women as well as our body's natural weight maintenance mechanisms and added muscle tissue  with exercise.  Encouraged pt that these heart healthy lifestyle changes will help overall health independant of weight loss. Pt reports not wanting to make any addiitonal changes at this time.    Expected Outcome  ST: eat small snack, fat with dinner, or more at dinner so as to not get hungry at night.  LT: get back in shape 6/10 now       Psychosocial: Target Goals: Acknowledge presence or absence of significant depression and/or stress, maximize coping skills, provide positive support system. Participant is able to verbalize types and ability to use techniques and skills needed for reducing stress and depression.   Education: Depression - Provides group verbal and written instruction on the correlation between heart/lung disease and depressed mood, treatment options, and the stigmas associated with seeking treatment.   Education: Sleep Hygiene -Provides group verbal and written instruction about how sleep can affect your health.  Define sleep hygiene, discuss sleep cycles and impact of sleep habits. Review good sleep hygiene tips.     Education: Stress and Anxiety: - Provides group verbal and written instruction about the health risks of elevated stress and causes of high stress.  Discuss the correlation between heart/lung disease and anxiety and treatment options. Review healthy ways to manage with stress and anxiety.    Initial Review & Psychosocial Screening: Initial Psych Review & Screening - 11/21/19 1310      Initial Review   Current issues with  Current Sleep Concerns;Current Stress Concerns    Source of Stress Concerns  Financial    Comments  He has cut back on work from [employer] and his stress levels have been lower.      Family Dynamics   Good Support System?  Yes    Comments  He can look to his wife and son.      Barriers   Psychosocial barriers to participate in program  There are no identifiable barriers or psychosocial needs.;The patient should benefit from training in stress  management and relaxation.      Screening Interventions   Interventions  Encouraged to exercise;To provide support and resources with identified psychosocial needs;Provide feedback about the scores to participant    Expected Outcomes  Short Term goal: Utilizing psychosocial counselor, staff and physician to assist with identification of specific Stressors or current issues interfering with healing process. Setting desired goal for each stressor or current issue identified.;Long Term Goal: Stressors or current issues are controlled or eliminated.;Short Term goal: Identification and review with participant of any Quality of Life or Depression concerns found by scoring the questionnaire.;Long Term goal: The participant improves quality of Life and PHQ9 Scores as seen by post scores and/or verbalization of changes       Quality of Life Scores:  Quality of Life - 11/22/19 1321      Quality of Life   Select  Quality of Life      Quality of Life Scores   Health/Function Pre  27.2 %    Socioeconomic Pre  29.29 %    Psych/Spiritual Pre  29.14 %    Family Pre  29.38 %    GLOBAL Pre  28.32 %      Scores of 19 and below usually indicate a poorer quality of life in these areas.  A difference of  2-3 points is a clinically meaningful difference.  A difference of 2-3 points in the total score of the Quality of Life Index has been associated with significant improvement in overall quality of life,  self-image, physical symptoms, and general health in studies assessing change in quality of life.  PHQ-9: Recent Review Flowsheet Data    Depression screen [facility] 2/9 11/22/2019   Decreased Interest 0   Down, Depressed, Hopeless 0   PHQ - 2 Score 0   Altered sleeping 0   Tired, decreased energy 1   Feeling bad or failure about yourself  0   Trouble concentrating 0   Moving slowly or fidgety/restless 0   Suicidal thoughts 0   PHQ-9 Score 1   Difficult doing work/chores Not difficult at all      Interpretation of Total Score  Total Score Depression Severity:  1-4 = Minimal depression, 5-9 = Mild depression, 10-14 = Moderate depression, 15-19 = Moderately severe depression, 20-27 = Severe depression   Psychosocial Evaluation and Intervention: Psychosocial Evaluation - 11/21/19 1312      Psychosocial Evaluation & Interventions   Interventions  Encouraged to exercise with the program and follow exercise prescription    Comments  He has cut back on work from [employer] and his stress levels have been lower.    Expected Outcomes  Short: Attend Heart[facility] stress management education to decrease stress. Long: Maintain exercise Post HeartTrack to keep stress at a minimum.    Continue Psychosocial Services   Follow up required by staff       Psychosocial Re-Evaluation: Psychosocial Re-Evaluation    Row Name 12/04/19 1136 01/10/20 0843           Psychosocial Re-Evaluation   Current issues with  None Identified  Current Stress Concerns      Comments  Richardson Landry is sleeping well and has a positive attitude. He has sold a house this week and is feeling good about it.  Richardson Landry states he feels great - no stress - enjoying life.      Expected Outcomes  Short: Attend HeartTrack stress management education to decrease stress. Long: Maintain exercise Post HeartTrack to keep stress at a minimum.  Short: continue to exercise and enjoy golf to keep stress low Long: maintain positive attitude      Interventions  Encouraged to attend Cardiac Rehabilitation for the exercise  --      Continue Psychosocial Services   Follow up required by staff  --         Psychosocial Discharge (Final Psychosocial Re-Evaluation): Psychosocial Re-Evaluation - 01/10/20 0843      Psychosocial Re-Evaluation   Current issues with  Current Stress Concerns    Comments  Richardson Landry states he feels great - no stress - enjoying life.    Expected Outcomes  Short: continue to exercise and enjoy golf to keep stress low Long: maintain  positive attitude       Vocational Rehabilitation: Provide vocational rehab assistance to qualifying candidates.   Vocational Rehab Evaluation & Intervention:   Education: Education Goals: Education classes will be provided on a variety of topics geared toward better understanding of heart health and risk factor modification. Participant will state understanding/return demonstration of topics presented as noted by education test scores.  Learning Barriers/Preferences: Learning Barriers/Preferences - 11/21/19 1313      Learning Barriers/Preferences   Learning Barriers  Sight    Learning Preferences  None       General Cardiac Education Topics:  AED/CPR: - Group verbal and written instruction with the use of models to demonstrate the basic use of the AED with the basic ABC's of resuscitation.   Anatomy & Physiology of the Heart: - Group verbal and  written instruction and models provide basic cardiac anatomy and physiology, with the coronary electrical and arterial systems. Review of Valvular disease and Heart Failure   Cardiac Procedures: - Group verbal and written instruction to review commonly prescribed medications for heart disease. Reviews the medication, class of the drug, and side effects. Includes the steps to properly store meds and maintain the prescription regimen. (beta blockers and nitrates)   Cardiac Medications I: - Group verbal and written instruction to review commonly prescribed medications for heart disease. Reviews the medication, class of the drug, and side effects. Includes the steps to properly store meds and maintain the prescription regimen.   Cardiac Medications II: -Group verbal and written instruction to review commonly prescribed medications for heart disease. Reviews the medication, class of the drug, and side effects. (all other drug classes)    Go Sex-Intimacy & Heart Disease, Get SMART - Goal Setting: - Group verbal and written instruction  through game format to discuss heart disease and the return to sexual intimacy. Provides group verbal and written material to discuss and apply goal setting through the application of the S.M.A.R.T. Method.   Other Matters of the Heart: - Provides group verbal, written materials and models to describe Stable Angina and Peripheral Artery. Includes description of the disease process and treatment options available to the cardiac patient.   Infection Prevention: - Provides verbal and written material to individual with discussion of infection control including proper hand washing and proper equipment cleaning during exercise session.   Cardiac Rehab from 11/22/2019 in [facility] Cardiac and Pulmonary Rehab  Date  11/22/19  Educator  AS  Instruction Review Code  1- Verbalizes Understanding      Falls Prevention: - Provides verbal and written material to individual with discussion of falls prevention and safety.   Cardiac Rehab from 11/22/2019 in [facility] Cardiac and Pulmonary Rehab  Date  11/22/19  Educator  AS  Instruction Review Code  1- Verbalizes Understanding      Other: -Provides group and verbal instruction on various topics (see comments)   Knowledge Questionnaire Score: Knowledge Questionnaire Score - 11/22/19 1321      Knowledge Questionnaire Score   Pre Score  24/28       Core Components/Risk Factors/Patient Goals at Admission: Personal Goals and Risk Factors at Admission - 11/22/19 1318      Core Components/Risk Factors/Patient Goals on Admission    Weight Management  Yes    Intervention  Weight Management: Develop a combined nutrition and exercise program designed to reach desired caloric intake, while maintaining appropriate intake of nutrient and fiber, sodium and fats, and appropriate energy expenditure required for the weight goal.;Weight Management/Obesity: Establish reasonable short term and long term weight goals.;Weight Management: Provide education and appropriate  resources to help participant work on and attain dietary goals.    Admit Weight  198 lb 14.4 oz (90.2 kg)    Expected Outcomes  Short Term: Continue to assess and modify interventions until short term weight is achieved;Weight Maintenance: Understanding of the daily nutrition guidelines, which includes 25-35% calories from fat, 7% or less cal from saturated fats, less than 233m cholesterol, less than 1.5gm of sodium, & 5 or more servings of fruits and vegetables daily;Long Term: Adherence to nutrition and physical activity/exercise program aimed toward attainment of established weight goal;Weight Loss: Understanding of general recommendations for a balanced deficit meal plan, which promotes 1-2 lb weight loss per week and includes a negative energy balance of [phone] kcal/d;Understanding recommendations for meals to  include 15-35% energy as protein, 25-35% energy from fat, 35-60% energy from carbohydrates, less than 29m of dietary cholesterol, 20-35 gm of total fiber daily    Hypertension  Yes    Intervention  Provide education on lifestyle modifcations including regular physical activity/exercise, weight management, moderate sodium restriction and increased consumption of fresh fruit, vegetables, and low fat dairy, alcohol moderation, and smoking cessation.;Monitor prescription use compliance.    Expected Outcomes  Short Term: Continued assessment and intervention until BP is < 140/927mHG in hypertensive participants. < 130/806mG in hypertensive participants with diabetes, heart failure or chronic kidney disease.;Long Term: Maintenance of blood pressure at goal levels.    Intervention  Provide education and support for participant on nutrition & aerobic/resistive exercise along with prescribed medications to achieve LDL <58m27mDL >40mg23m Expected Outcomes  Short Term: Participant states understanding of desired cholesterol values and is compliant with medications prescribed. Participant is  following exercise prescription and nutrition guidelines.;Long Term: Cholesterol controlled with medications as prescribed, with individualized exercise RX and with personalized nutrition plan. Value goals: LDL < 58mg,11m > 40 mg.       Education:Diabetes - Individual verbal and written instruction to review signs/symptoms of diabetes, desired ranges of glucose level fasting, after meals and with exercise. Acknowledge that pre and post exercise glucose checks will be done for 3 sessions at entry of program.   Education: Know Your Numbers and Risk Factors: -Group verbal and written instruction about important numbers in your health.  Discussion of what are risk factors and how they play a role in the disease process.  Review of Cholesterol, Blood Pressure, Diabetes, and BMI and the role they play in your overall health.   Core Components/Risk Factors/Patient Goals Review:  Goals and Risk Factor Review    Row Name 12/04/19 1137 01/10/20 0842           Core Components/Risk Factors/Patient Goals Review   Personal Goals Review  Weight Management/Obesity;Hypertension;Lipids  Weight Management/Obesity;Hypertension;Lipids      Review  Steve Richardson Landry to lose about 20 pounds. Since the start of the program he has lost 3 pounds. He checks his blood pressure twice a day at home. His heart rate has been 65 bpm at rest.  Steve Richardson Landryeing steady slow weight loss.  He takes all meds as directed and feels great.  he plays golf and exercises at home ouside program sessions.      Expected Outcomes  Short: lose 5 pounds in couple weeks. Long: lose 20 pounds.  Short: continue heart healthy eating and exercise         Core Components/Risk Factors/Patient Goals at Discharge (Final Review):  Goals and Risk Factor Review - 01/10/20 0842      Core Components/Risk Factors/Patient Goals Review   Personal Goals Review  Weight Management/Obesity;Hypertension;Lipids    Review  Steve Richardson Landryeing steady slow weight loss.   He takes all meds as directed and feels great.  he plays golf and exercises at home ouside program sessions.    Expected Outcomes  Short: continue heart healthy eating and exercise       ITP Comments: ITP Comments    Row Name 11/21/19 1324 11/23/19 1200 11/29/19 1051 12/27/19 1038 01/24/20 0840   ITP Comments  Virtual Orientation performed. Patient informed when to come in for RD and EP orientation. Diagnosis can be found in Care Everywhere 11/10/2019.  First full day of exercise!  Patient was oriented to gym and equipment including  functions, settings, policies, and procedures.  Patient's individual exercise prescription and treatment plan were reviewed.  All starting workloads were established based on the results of the 6 minute walk test done at initial orientation visit.  The plan for exercise progression was also introduced and progression will be customized based on patient's performance and goals  30 day chart review completed. ITP sent to Dr Zachery Dakins Medical Director, for review,changes as needed and signature.  New to program  30 day chart review completed. ITP sent to Dr Zachery Dakins Medical Director, for review,changes as needed and signature. Continue with ITP if no changes requested  30 Day review completed. Medical Director review done, changes made as directed,and approval shown by signature of Market researcher.      Comments:

## 2020-01-26 ENCOUNTER — Encounter: Payer: Medicare Other | Admitting: *Deleted

## 2020-01-26 ENCOUNTER — Other Ambulatory Visit: Payer: Self-pay

## 2020-01-26 DIAGNOSIS — I214 Non-ST elevation (NSTEMI) myocardial infarction: Secondary | ICD-10-CM

## 2020-01-26 DIAGNOSIS — I252 Old myocardial infarction: Secondary | ICD-10-CM | POA: Diagnosis not present

## 2020-01-26 DIAGNOSIS — Z955 Presence of coronary angioplasty implant and graft: Secondary | ICD-10-CM

## 2020-01-26 NOTE — Progress Notes (Signed)
Daily Session Note  Patient Details  Name: Connor Tyler MRN: [id number] Date of Birth: 1946/04/16 Referring Provider:     Cardiac Rehab from 11/22/2019 in [facility] Cardiac and Pulmonary Rehab  Referring Provider  Fletcher Anon      Encounter Date: 01/26/2020  Check In: Session Check In - 01/26/20 0820      Check-In   Supervising physician immediately available to respond to emergencies  See telemetry face sheet for immediately available ER MD    Location  [facility]-Cardiac & Pulmonary Rehab    Staff Present  Heath Lark, RN, BSN, CCRP;Joseph Hood RCP,RRT,BSRT;Jessica Russell, [place], [place], [place], CCET    Virtual Visit  No    Medication changes reported      No    Fall or balance concerns reported     No    Warm-up and Cool-down  Performed on first and last piece of equipment    Resistance Training Performed  Yes    VAD Patient?  No    PAD/SET Patient?  No      Pain Assessment   Currently in Pain?  No/denies          Social History   Tobacco Use  Smoking Status Former Smoker  . Packs/day: 1.00  . Years: 13.00  . Pack years: 13.00  . Types: Cigarettes  . Quit date: 56  . Years since quitting: 49.4  Smokeless Tobacco Never Used    Goals Met:  Independence with exercise equipment Exercise tolerated well No report of cardiac concerns or symptoms  Goals Unmet:  Not Applicable  Comments: Pt able to follow exercise prescription today without complaint.  Will continue to monitor for progression.    Dr. Emily Filbert is Medical Director for [place] and [name].

## 2020-02-07 ENCOUNTER — Other Ambulatory Visit: Payer: Self-pay

## 2020-02-07 ENCOUNTER — Encounter: Payer: Medicare Other | Attending: Cardiovascular Disease | Admitting: *Deleted

## 2020-02-07 DIAGNOSIS — Z955 Presence of coronary angioplasty implant and graft: Secondary | ICD-10-CM | POA: Diagnosis not present

## 2020-02-07 DIAGNOSIS — I252 Old myocardial infarction: Secondary | ICD-10-CM | POA: Diagnosis not present

## 2020-02-07 DIAGNOSIS — I214 Non-ST elevation (NSTEMI) myocardial infarction: Secondary | ICD-10-CM

## 2020-02-07 NOTE — Progress Notes (Signed)
Daily Session Note  Patient Details  Name: Connor Tyler MRN: [id number] Date of Birth: [birth year] Referring Provider:     Cardiac Rehab from 11/22/2019 in [facility] Cardiac and Pulmonary Rehab  Referring Provider  Fletcher Anon      Encounter Date: 02/07/2020  Check In: Session Check In - 02/07/20 0802      Check-In   Supervising physician immediately available to respond to emergencies  See telemetry face sheet for immediately available ER MD    Location  [facility]-Cardiac & Pulmonary Rehab    Staff Present  Nyoka Cowden, RN, BSN, MA;Susanne Bice, RN, BSN, CCRP;Amanda Sommer, BA, ACSM CEP, Exercise Physiologist    Virtual Visit  No    Medication changes reported      No    Fall or balance concerns reported     No    Tobacco Cessation  No Change    Warm-up and Cool-down  Performed on first and last piece of equipment    Resistance Training Performed  No    VAD Patient?  No      Pain Assessment   Currently in Pain?  No/denies          Social History   Tobacco Use  Smoking Status Former Smoker  . Packs/day: 1.00  . Years: 13.00  . Pack years: 13.00  . Types: Cigarettes  . Quit date: 57  . Years since quitting: 49.4  Smokeless Tobacco Never Used    Goals Met:  Independence with exercise equipment Exercise tolerated well No report of cardiac concerns or symptoms  Goals Unmet:  Not Applicable  Comments: Pt able to follow exercise prescription today without complaint.  Will continue to monitor for progression.    Dr. Emily Filbert is Medical Director for [name] and [name].

## 2020-02-09 ENCOUNTER — Other Ambulatory Visit: Payer: Self-pay

## 2020-02-09 ENCOUNTER — Encounter: Payer: Medicare Other | Admitting: *Deleted

## 2020-02-09 DIAGNOSIS — I214 Non-ST elevation (NSTEMI) myocardial infarction: Secondary | ICD-10-CM

## 2020-02-09 DIAGNOSIS — I252 Old myocardial infarction: Secondary | ICD-10-CM | POA: Diagnosis not present

## 2020-02-09 NOTE — Progress Notes (Signed)
Daily Session Note  Patient Details  Name: Connor Tyler MRN: [id number] Date of Birth: [birth year] Referring Provider:     Cardiac Rehab from 11/22/2019 in [facility] Cardiac and Pulmonary Rehab  Referring Provider  Fletcher Anon      Encounter Date: 02/09/2020  Check In:      Social History   Tobacco Use  Smoking Status Former Smoker  . Packs/day: 1.00  . Years: 13.00  . Pack years: 13.00  . Types: Cigarettes  . Quit date: 24  . Years since quitting: 49.4  Smokeless Tobacco Never Used    Goals Met:  Independence with exercise equipment Exercise tolerated well No report of cardiac concerns or symptoms  Goals Unmet:  Not Applicable  Comments: Pt able to follow exercise prescription today without complaint.  Will continue to monitor for progression.    Dr. Emily Filbert is Medical Director for [place] and [name].

## 2020-02-12 ENCOUNTER — Encounter: Payer: Medicare Other | Admitting: *Deleted

## 2020-02-12 ENCOUNTER — Other Ambulatory Visit: Payer: Self-pay

## 2020-02-12 DIAGNOSIS — I214 Non-ST elevation (NSTEMI) myocardial infarction: Secondary | ICD-10-CM

## 2020-02-12 DIAGNOSIS — I252 Old myocardial infarction: Secondary | ICD-10-CM | POA: Diagnosis not present

## 2020-02-12 DIAGNOSIS — Z955 Presence of coronary angioplasty implant and graft: Secondary | ICD-10-CM

## 2020-02-12 NOTE — Progress Notes (Signed)
Daily Session Note  Patient Details  Name: Connor Tyler MRN: [id number] Date of Birth: [birth year] Referring Provider:     Cardiac Rehab from 11/22/2019 in [facility] Cardiac and Pulmonary Rehab  Referring Provider  Fletcher Anon      Encounter Date: 02/12/2020  Check In: Session Check In - 02/12/20 0944      Check-In   Supervising physician immediately available to respond to emergencies  See telemetry face sheet for immediately available ER MD    Location  [facility]-Cardiac & Pulmonary Rehab    Staff Present  Earlean Shawl, BS, ACSM CEP, Exercise Physiologist;Joseph Flavia Shipper;Heath Lark, RN, BSN, CCRP    Virtual Visit  No    Medication changes reported      No    Fall or balance concerns reported     No    Tobacco Cessation  No Change    Warm-up and Cool-down  Performed on first and last piece of equipment    Resistance Training Performed  Yes    VAD Patient?  No    PAD/SET Patient?  No      Pain Assessment   Currently in Pain?  No/denies          Social History   Tobacco Use  Smoking Status Former Smoker  . Packs/day: 1.00  . Years: 13.00  . Pack years: 13.00  . Types: Cigarettes  . Quit date: 27  . Years since quitting: 49.4  Smokeless Tobacco Never Used    Goals Met:  Independence with exercise equipment Exercise tolerated well Personal goals reviewed No report of cardiac concerns or symptoms Strength training completed today  Goals Unmet:  Not Applicable  Comments: Pt able to follow exercise prescription today without complaint.  Will continue to monitor for progression.  [place] Name 11/22/19 1311 02/12/20 0946       6 Minute Walk   Phase  Initial  Discharge    Distance  1698 feet  1990 feet    Distance % Change  --  17.2 %    Distance Feet Change  --  292 ft    Walk Time  6 minutes  6 minutes    # of Rest Breaks  0  0    MPH  3.2  3.77    METS  3.33  4.13    RPE  9  10    Perceived Dyspnea   0  0    VO2 Peak  11.7  14.45     Symptoms  No  No    Resting HR  78 bpm  88 bpm    Resting BP  134/66  112/62    Resting Oxygen Saturation   96 %  --    Exercise Oxygen Saturation  during 6 min walk  98 %  --    Max Ex. HR  104 bpm  110 bpm    Max Ex. BP  122/56  134/74    2 Minute Post BP  116/60  --         Dr. Emily Filbert is Medical Director for [place] and [name].

## 2020-02-14 ENCOUNTER — Encounter: Payer: Medicare Other | Admitting: *Deleted

## 2020-02-14 ENCOUNTER — Other Ambulatory Visit: Payer: Self-pay

## 2020-02-14 DIAGNOSIS — I252 Old myocardial infarction: Secondary | ICD-10-CM | POA: Diagnosis not present

## 2020-02-14 DIAGNOSIS — Z955 Presence of coronary angioplasty implant and graft: Secondary | ICD-10-CM

## 2020-02-14 DIAGNOSIS — I214 Non-ST elevation (NSTEMI) myocardial infarction: Secondary | ICD-10-CM

## 2020-02-14 NOTE — Progress Notes (Signed)
Daily Session Note  Patient Details  Name: Connor Tyler MRN: [id number] Date of Birth: [birth year] Referring Provider:     Cardiac Rehab from 11/22/2019 in [facility] Cardiac and Pulmonary Rehab  Referring Provider  Fletcher Anon      Encounter Date: 02/14/2020  Check In: Session Check In - 02/14/20 0933      Check-In   Supervising physician immediately available to respond to emergencies  See telemetry face sheet for immediately available ER MD    Location  [facility]-Cardiac & Pulmonary Rehab    Staff Present  Heath Lark, RN, BSN, CCRP;Melissa Caiola RDN, LDN;Joseph Hood RCP,RRT,BSRT    Virtual Visit  No    Medication changes reported      No    Fall or balance concerns reported     No    Warm-up and Cool-down  Performed on first and last piece of equipment    Resistance Training Performed  Yes    VAD Patient?  No    PAD/SET Patient?  No      Pain Assessment   Currently in Pain?  No/denies        Exercise Prescription Changes - 02/14/20 0800      Response to Exercise   Blood Pressure (Admit)  112/62    Blood Pressure (Exercise)  134/74    Blood Pressure (Exit)  124/70    Heart Rate (Admit)  83 bpm    Heart Rate (Exercise)  110 bpm    Heart Rate (Exit)  92 bpm    Rating of Perceived Exertion (Exercise)  14    Symptoms  none    Duration  Continue with 30 min of aerobic exercise without signs/symptoms of physical distress.    Intensity  THRR unchanged      Progression   Progression  Continue to progress workloads to maintain intensity without signs/symptoms of physical distress.    Average METs  3.53      Resistance Training   Training Prescription  Yes    Weight  10 lb    Reps  10-15      Interval Training   Interval Training  No      Treadmill   MPH  3.6    Grade  4.5    Minutes  15    METs  5.99      Recumbant Elliptical   Level  3    Minutes  15    METs  2.1      REL-XR   Level  5    Minutes  15    METs  2.5      Home Exercise Plan   Plans to continue  exercise at  Home (comment)   walking, [employer], staff videos   Frequency  Add 2 additional days to program exercise sessions.    Initial Home Exercises Provided  12/29/19       Social History   Tobacco Use  Smoking Status Former Smoker  . Packs/day: 1.00  . Years: 13.00  . Pack years: 13.00  . Types: Cigarettes  . Quit date: 22  . Years since quitting: 49.4  Smokeless Tobacco Never Used    Goals Met:  Independence with exercise equipment Exercise tolerated well No report of cardiac concerns or symptoms  Goals Unmet:  Not Applicable  Comments: Pt able to follow exercise prescription today without complaint.  Will continue to monitor for progression.    Dr. Emily Filbert is Medical Director for [place] and [name].

## 2020-02-16 ENCOUNTER — Other Ambulatory Visit: Payer: Self-pay

## 2020-02-16 ENCOUNTER — Encounter: Payer: Medicare Other | Admitting: *Deleted

## 2020-02-16 DIAGNOSIS — I214 Non-ST elevation (NSTEMI) myocardial infarction: Secondary | ICD-10-CM

## 2020-02-16 DIAGNOSIS — I252 Old myocardial infarction: Secondary | ICD-10-CM | POA: Diagnosis not present

## 2020-02-16 DIAGNOSIS — Z955 Presence of coronary angioplasty implant and graft: Secondary | ICD-10-CM

## 2020-02-16 NOTE — Progress Notes (Signed)
Daily Session Note  Patient Details  Name: Connor Tyler MRN: [id number] Date of Birth: 05/25/1946 Referring Provider:     Cardiac Rehab from 11/22/2019 in [facility]  Referring Provider Arida      Encounter Date: 02/16/2020  Check In:  Session Check In - 02/16/20 0835      Check-In   Supervising physician immediately available to respond to emergencies See telemetry face sheet for immediately available ER MD    Location [facility]    Staff Present  , RN, BSN, CCRP;Jessica Hawkins, MA, RCEP, CCRP, CCET;Joseph Hood RCP,RRT,BSRT    Virtual Visit No    Medication changes reported     No    Fall or balance concerns reported    No    Warm-up and Cool-down Performed on first and last piece of equipment    Resistance Training Performed Yes    VAD Patient? No    PAD/SET Patient? No      Pain Assessment   Currently in Pain? No/denies              Social History   Tobacco Use  Smoking Status Former Smoker  . Packs/day: 1.00  . Years: 13.00  . Pack years: 13.00  . Types: Cigarettes  . Quit date: 1972  . Years since quitting: 49.4  Smokeless Tobacco Never Used    Goals Met:  Independence with exercise equipment Exercise tolerated well No report of cardiac concerns or symptoms  Goals Unmet:  Not Applicable  Comments: Pt able to follow exercise prescription today without complaint.  Will continue to monitor for progression.    Dr. Mark Miller is Medical Director for [facility] and [name]. 

## 2020-02-19 ENCOUNTER — Encounter: Payer: Medicare Other | Admitting: *Deleted

## 2020-02-19 ENCOUNTER — Other Ambulatory Visit: Payer: Self-pay

## 2020-02-19 DIAGNOSIS — I214 Non-ST elevation (NSTEMI) myocardial infarction: Secondary | ICD-10-CM

## 2020-02-19 DIAGNOSIS — I252 Old myocardial infarction: Secondary | ICD-10-CM | POA: Diagnosis not present

## 2020-02-19 DIAGNOSIS — Z955 Presence of coronary angioplasty implant and graft: Secondary | ICD-10-CM

## 2020-02-19 NOTE — Progress Notes (Signed)
Daily Session Note  Patient Details  Name: DEMARIS LEAVELL MRN: [id number] Date of Birth: 05-05-1946 Referring Provider:     Cardiac Rehab from 11/22/2019 in [facility]  Referring Provider Fletcher Anon      Encounter Date: 02/19/2020  Check In:  Session Check In - 02/19/20 0821      Check-In   Supervising physician immediately available to respond to emergencies See telemetry face sheet for immediately available ER MD    Location [facility]    Staff Present Heath Lark, RN, BSN, CCRP;Joseph Hood RCP,RRT,BSRT;Kelly [place], [place], ACSM CEP, Exercise Physiologist    Virtual Visit No    Medication changes reported     No    Fall or balance concerns reported    No    Warm-up and Cool-down Performed on first and last piece of equipment    Resistance Training Performed Yes    VAD Patient? No    PAD/SET Patient? No      Pain Assessment   Currently in Pain? No/denies              Social History   Tobacco Use  Smoking Status Former Smoker   Packs/day: 1.00   Years: 13.00   Pack years: 13.00   Types: Cigarettes   Quit date: 1972   Years since quitting: 49.4  Smokeless Tobacco Never Used    Goals Met:  Independence with exercise equipment Exercise tolerated well No report of cardiac concerns or symptoms  Goals Unmet:  Not Applicable  Comments: Pt able to follow exercise prescription today without complaint.  Will continue to monitor for progression.    Dr. Emily Filbert is Medical Director for [place] and [name].

## 2020-02-21 ENCOUNTER — Other Ambulatory Visit: Payer: Self-pay

## 2020-02-21 ENCOUNTER — Encounter: Payer: Self-pay | Admitting: *Deleted

## 2020-02-21 ENCOUNTER — Encounter: Payer: Medicare Other | Admitting: *Deleted

## 2020-02-21 DIAGNOSIS — Z955 Presence of coronary angioplasty implant and graft: Secondary | ICD-10-CM

## 2020-02-21 DIAGNOSIS — I214 Non-ST elevation (NSTEMI) myocardial infarction: Secondary | ICD-10-CM

## 2020-02-21 DIAGNOSIS — I252 Old myocardial infarction: Secondary | ICD-10-CM | POA: Diagnosis not present

## 2020-02-21 NOTE — Patient Instructions (Signed)
Discharge Patient Instructions  Patient Details  Name: Connor Tyler MRN: [id number] Date of Birth: [birth year] Referring Provider:  Wellington Hampshire, MD   Number of Visits: 27  Reason for Discharge:  Patient reached a stable level of exercise. Patient independent in their exercise. Patient has met program and personal goals.  Smoking History:  Social History   Tobacco Use  Smoking Status Former Smoker  . Packs/day: 1.00  . Years: 13.00  . Pack years: 13.00  . Types: Cigarettes  . Quit date: 57  . Years since quitting: 49.4  Smokeless Tobacco Never Used    Diagnosis:  NSTEMI (non-ST elevation myocardial infarction) ([place])  Status post coronary artery stent placement  Initial Exercise Prescription:  Initial Exercise Prescription - 11/22/19 1300      Date of Initial Exercise RX and Referring Provider   Date 11/22/19    Referring Provider Arida      Treadmill   MPH 3    Grade 0    Minutes 15    METs 3.33      Recumbant Bike   Level 3    RPM 60    Watts 38    Minutes 15    METs 3.33      Elliptical   Level 1    Speed 3    Minutes 15      REL-XR   Level 3    Speed 50    Minutes 15    METs 3.3      Prescription Details   Frequency (times per week) 3    Duration Progress to 30 minutes of continuous aerobic without signs/symptoms of physical distress      Intensity   THRR 40-80% of Max Heartrate 106-134    Ratings of Perceived Exertion 11-15    Perceived Dyspnea 0-4      Resistance Training   Training Prescription Yes    Weight 6 lb    Reps 10-15           Discharge Exercise Prescription (Final Exercise Prescription Changes):  Exercise Prescription Changes - 02/14/20 0800      Response to Exercise   Blood Pressure (Admit) 112/62    Blood Pressure (Exercise) 134/74    Blood Pressure (Exit) 124/70    Heart Rate (Admit) 83 bpm    Heart Rate (Exercise) 110 bpm    Heart Rate (Exit) 92 bpm    Rating of Perceived Exertion (Exercise)  14    Symptoms none    Duration Continue with 30 min of aerobic exercise without signs/symptoms of physical distress.    Intensity THRR unchanged      Progression   Progression Continue to progress workloads to maintain intensity without signs/symptoms of physical distress.    Average METs 3.53      Resistance Training   Training Prescription Yes    Weight 10 lb    Reps 10-15      Interval Training   Interval Training No      Treadmill   MPH 3.6    Grade 4.5    Minutes 15    METs 5.99      Recumbant Elliptical   Level 3    Minutes 15    METs 2.1      REL-XR   Level 5    Minutes 15    METs 2.5      Home Exercise Plan   Plans to continue exercise at Home (comment)   walking, [employer], staff videos  Frequency Add 2 additional days to program exercise sessions.    Initial Home Exercises Provided 12/29/19           Functional Capacity:  6 Minute Walk    Row Name 11/22/19 1311 02/12/20 0946       6 Minute Walk   Phase Initial Discharge    Distance 1698 feet 1990 feet    Distance % Change -- 17.2 %    Distance Feet Change -- 292 ft    Walk Time 6 minutes 6 minutes    # of Rest Breaks 0 0    MPH 3.2 3.77    METS 3.33 4.13    RPE 9 10    Perceived Dyspnea  0 0    VO2 Peak 11.7 14.45    Symptoms No No    Resting HR 78 bpm 88 bpm    Resting BP 134/66 112/62    Resting Oxygen Saturation  96 % --    Exercise Oxygen Saturation  during 6 min walk 98 % --    Max Ex. HR 104 bpm 110 bpm    Max Ex. BP 122/56 134/74    2 Minute Post BP 116/60 --           Nutrition & Weight - Outcomes:  Pre Biometrics - 11/22/19 1317      Pre Biometrics   Height 5' 8.5" (1.74 m)    Weight 198 lb 14.4 oz (90.2 kg)    BMI (Calculated) 29.8    Single Leg Stand 30 seconds            Nutrition:  Nutrition Therapy & Goals - 11/30/19 1123      Nutrition Therapy   Diet HH, Low Na    Protein (specify units) 70-75g    Fiber 30 grams    Whole Grain Foods 3 servings     Saturated Fats 12 max. grams    Fruits and Vegetables 5 servings/day    Sodium 1.5 grams      Personal Nutrition Goals   Nutrition Goal ST: add peanut butter to apple or banana snack LT: get back in shape 6/10 now    Comments B: bowl of oatmeal with milk (2%) and agave and cup of coffee (decaf). S: apple or banana D: fish, 2-3x chicken, steak 1x/week. Pt just bought 2 containers of crunchy jif, told pt he could use those, but to choose a peanut butter with just peanuts after finished. Discussed HH eating.      Intervention Plan   Intervention Nutrition handout(s) given to patient.;Prescribe, educate and counsel regarding individualized specific dietary modifications aiming towards targeted core components such as weight, hypertension, lipid management, diabetes, heart failure and other comorbidities.    Expected Outcomes Short Term Goal: Understand basic principles of dietary content, such as calories, fat, sodium, cholesterol and nutrients.;Short Term Goal: A plan has been developed with personal nutrition goals set during dietitian appointment.;Long Term Goal: Adherence to prescribed nutrition plan.           Nutrition Discharge:  Nutrition Assessments - 11/22/19 1253      MEDFICTS Scores   Pre Score 100           Education Questionnaire Score:  Knowledge Questionnaire Score - 11/22/19 1321      Knowledge Questionnaire Score   Pre Score 24/28           Goals reviewed with patient; copy given to patient.

## 2020-02-21 NOTE — Progress Notes (Signed)
Daily Session Note  Patient Details  Name: Connor Tyler MRN: [id number] Date of Birth: Jul 13, 1946 Referring Provider:     Cardiac Rehab from 11/22/2019 in [facility] Re[facility]  Referring Provider Fletcher Anon      Encounter Date: 02/21/2020  Check In:  Session Check In - 02/21/20 0511      Check-In   Supervising physician immediately available to respond to emergencies See telemetry face sheet for immediately available ER MD    Location [facility]    Staff Present Heath Lark, RN, BSN, CCRP;Joseph Hood RCP,RRT,BSRT;Amanda [place], [place], ACSM CEP, Exercise Physiologist    Virtual Visit No    Medication changes reported     No    Fall or balance concerns reported    No    Warm-up and Cool-down Performed on first and last piece of equipment    Resistance Training Performed Yes    VAD Patient? No    PAD/SET Patient? No      Pain Assessment   Currently in Pain? No/denies              Social History   Tobacco Use  Smoking Status Former Smoker  . Packs/day: 1.00  . Years: 13.00  . Pack years: 13.00  . Types: Cigarettes  . Quit date: 60  . Years since quitting: 49.4  Smokeless Tobacco Never Used    Goals Met:  Independence with exercise equipment Exercise tolerated well No report of cardiac concerns or symptoms  Goals Unmet:  Not Applicable  Comments: Pt able to follow exercise prescription today without complaint.  Will continue to monitor for progression.    Dr. Emily Filbert is Medical Director for [place] and [name].

## 2020-02-21 NOTE — Progress Notes (Signed)
Cardiac Individual Treatment Plan  Patient Details  Name: Connor Tyler MRN: [id number] Date of Birth: Jan 01, 1946 Referring Provider:     Cardiac Rehab from 11/22/2019 in [facility] Cardiac and Pulmonary Rehab  Referring Provider Arida      Initial Encounter Date:    Cardiac Rehab from 11/22/2019 in [facility] Cardiac and Pulmonary Rehab  Date 11/22/19      Visit Diagnosis: NSTEMI (non-ST elevation myocardial infarction) [facility])  Status post coronary artery stent placement  Patient's Home Medications on Admission:  Current Outpatient Medications:  .  albuterol (PROAIR HFA) 108 (90 BASE) MCG/ACT inhaler, Inhale 2 puffs into the lungs every 4 (four) hours as needed for wheezing or shortness of breath. , Disp: , Rfl:  .  amLODipine (NORVASC) 10 MG tablet, Take 1 tablet (10 mg total) by mouth daily., Disp: 30 tablet, Rfl: 6 .  aspirin EC 81 MG tablet, Take 81 mg by mouth daily. , Disp: , Rfl:  .  atorvastatin (LIPITOR) 80 MG tablet, Take 1 tablet (80 mg total) by mouth daily., Disp: 30 tablet, Rfl: 6 .  carvedilol (COREG) 6.25 MG tablet, Take 1 tablet (6.25 mg total) by mouth 2 (two) times daily., Disp: 60 tablet, Rfl: 6 .  cetirizine (ZYRTEC) 10 MG tablet, Take 10 mg by mouth daily. , Disp: , Rfl:  .  Coenzyme Q10 10 MG capsule, Take 10 mg by mouth daily. , Disp: , Rfl:  .  lisinopril (PRINIVIL,ZESTRIL) 40 MG tablet, TAKE 1 TABLET ONE TIME DAILY, Disp: , Rfl:  .  Melatonin 5 MG CAPS, Take 10 mg by mouth at bedtime. , Disp: , Rfl:  .  Multiple Vitamins-Minerals (ZINC PO), Take by mouth. Taking 1 capsule daily, Disp: , Rfl:  .  nitroGLYCERIN (NITROSTAT) 0.4 MG SL tablet, Place 1 tablet (0.4 mg total) under the tongue every 5 (five) minutes as needed for chest pain., Disp: 25 tablet, Rfl: 3 .  pantoprazole (PROTONIX) 40 MG tablet, Take 1 tablet (40 mg total) by mouth daily., Disp: 30 tablet, Rfl: 6 .  phenylephrine-shark liver oil-mineral oil-petrolatum (PREPARATION H) 0.25-3-14-71.9 % rectal  ointment, Place 1 application rectally 2 (two) times daily as needed for hemorrhoids., Disp: , Rfl:  .  ticagrelor (BRILINTA) 90 MG TABS tablet, Take 1 tablet (90 mg total) by mouth 2 (two) times daily., Disp: 60 tablet, Rfl: 6 .  vitamin B-12 (CYANOCOBALAMIN) 500 MCG tablet, Take 500 mcg by mouth daily., Disp: , Rfl:  .  vitamin C (ASCORBIC ACID) 500 MG tablet, Take 500 mg by mouth daily., Disp: , Rfl:   Past Medical History: Past Medical History:  Diagnosis Date  . Allergic rhinitis   . Anemia   . Asthma   . Coronary artery disease 11/11   Diffuse mild 3 vessell CAD by cardiac cath @ the [facility]  . ED (erectile dysfunction)   . GERD (gastroesophageal reflux disease)   . Heart murmur   . History of MRSA infection   . Hypertension   . Mitral regurgitation   . Valvular heart disease     Tobacco Use: Social History   Tobacco Use  Smoking Status Former Smoker  . Packs/day: 1.00  . Years: 13.00  . Pack years: 13.00  . Types: Cigarettes  . Quit date: 23  . Years since quitting: 49.4  Smokeless Tobacco Never Used    Labs: Recent Review [facility] for [facility] Cardiac and Pulmonary Rehab Latest Ref Rng & Units 12/28/2019   Cholestrol  100 - 199 mg/dL 89(L)   LDLCALC 0 - 99 mg/dL 37   HDL >39 mg/dL 41   Trlycerides 0 - 149 mg/dL 38       Exercise Target Goals: Exercise Program Goal: Individual exercise prescription set using results from initial 6 min walk test and THRR while considering  patient's activity barriers and safety.   Exercise Prescription Goal: Initial exercise prescription builds to 30-45 minutes a day of aerobic activity, 2-3 days per week.  Home exercise guidelines will be given to patient during program as part of exercise prescription that the participant will acknowledge.   Education: Aerobic Exercise & Resistance Training: - Gives group verbal and written instruction on the various components of exercise. Focuses on aerobic and  resistive training programs and the benefits of this training and how to safely progress through these programs..   Education: Exercise & Equipment Safety: - Individual verbal instruction and demonstration of equipment use and safety with use of the equipment.   Cardiac Rehab from 11/22/2019 in [facility] Cardiac and Pulmonary Rehab  Date 11/22/19  Educator AS  Instruction Review Code 1- Verbalizes Understanding      Education: Exercise Physiology & General Exercise Guidelines: - Group verbal and written instruction with models to review the exercise physiology of the cardiovascular system and associated critical values. Provides general exercise guidelines with specific guidelines to those with heart or lung disease.    Education: Flexibility, Balance, Mind/Body Relaxation: Provides group verbal/written instruction on the benefits of flexibility and balance training, including mind/body exercise modes such as yoga, pilates and tai chi.  Demonstration and skill practice provided.   Activity Barriers & Risk Stratification:   6 Minute Walk:  6 Minute Walk    Row Name 11/22/19 1311 02/12/20 0946       6 Minute Walk   Phase Initial Discharge    Distance 1698 feet 1990 feet    Distance % Change -- 17.2 %    Distance Feet Change -- 292 ft    Walk Time 6 minutes 6 minutes    # of Rest Breaks 0 0    MPH 3.2 3.77    METS 3.33 4.13    RPE 9 10    Perceived Dyspnea  0 0    VO2 Peak 11.7 14.45    Symptoms No No    Resting HR 78 bpm 88 bpm    Resting BP 134/66 112/62    Resting Oxygen Saturation  96 % --    Exercise Oxygen Saturation  during 6 min walk 98 % --    Max Ex. HR 104 bpm 110 bpm    Max Ex. BP 122/56 134/74    2 Minute Post BP 116/60 --           Oxygen Initial Assessment:   Oxygen Re-Evaluation:   Oxygen Discharge (Final Oxygen Re-Evaluation):   Initial Exercise Prescription:  Initial Exercise Prescription - 11/22/19 1300      Date of Initial Exercise RX and  Referring Provider   Date 11/22/19    Referring Provider Arida      Treadmill   MPH 3    Grade 0    Minutes 15    METs 3.33      Recumbant Bike   Level 3    RPM 60    Watts 38    Minutes 15    METs 3.33      Elliptical   Level 1    Speed 3  Minutes 15      REL-XR   Level 3    Speed 50    Minutes 15    METs 3.3      Prescription Details   Frequency (times per week) 3    Duration Progress to 30 minutes of continuous aerobic without signs/symptoms of physical distress      Intensity   THRR 40-80% of Max Heartrate 106-134    Ratings of Perceived Exertion 11-15    Perceived Dyspnea 0-4      Resistance Training   Training Prescription Yes    Weight 6 lb    Reps 10-15           Perform Capillary Blood Glucose checks as needed.  Exercise Prescription Changes:  Exercise Prescription Changes    Row Name 11/22/19 1300 12/06/19 1600 12/22/19 1000 12/29/19 0800 01/03/20 1400     Response to Exercise   Blood Pressure (Admit) 134/66 110/70 130/64 -- 122/70   Blood Pressure (Exercise) 122/56 122/70 158/64 -- 150/88   Blood Pressure (Exit) 116/60 128/82 120/60 -- 136/64   Heart Rate (Admit) 78 bpm 77 bpm 79 bpm -- 74 bpm   Heart Rate (Exercise) 104 bpm 102 bpm 107 bpm -- 113 bpm   Heart Rate (Exit) 86 bpm 88 bpm 91 bpm -- 92 bpm   Oxygen Saturation (Admit) 96 % -- -- -- --   Oxygen Saturation (Exercise) 98 % -- -- -- --   Rating of Perceived Exertion (Exercise) '9 12 14 ' -- 14   Perceived Dyspnea (Exercise) 0 -- -- -- --   Symptoms none none none -- none   Duration -- Continue with 30 min of aerobic exercise without signs/symptoms of physical distress. Continue with 30 min of aerobic exercise without signs/symptoms of physical distress. -- Continue with 30 min of aerobic exercise without signs/symptoms of physical distress.   Intensity -- THRR unchanged THRR unchanged -- THRR unchanged     Progression   Progression -- Continue to progress workloads to maintain  intensity without signs/symptoms of physical distress. Continue to progress workloads to maintain intensity without signs/symptoms of physical distress. -- Continue to progress workloads to maintain intensity without signs/symptoms of physical distress.   Average METs -- 3.8 4.27 -- 4.4     Resistance Training   Training Prescription -- Yes Yes -- Yes   Weight -- 6 lb 6 lb -- 10 lb   Reps -- 10-15 10-15 -- 10-15     Interval Training   Interval Training -- No No -- --     Treadmill   MPH -- 3.1 3.4 -- 3.4   Grade -- 1 3 -- 3   Minutes -- 15 15 -- 15   METs -- 3.8 5.01 -- 5.01     REL-XR   Level -- 3 4 -- --   Minutes -- 15 15 -- --   METs -- 3.8 4.4 -- --     T5 Nustep   Level -- -- 4 -- 5   SPM -- -- -- -- 80   Minutes -- -- 15 -- 15   METs -- -- 3.4 -- 3.8     Home Exercise Plan   Plans to continue exercise at -- -- -- Home (comment)  walking, [employer], staff videos Home (comment)  walking, [employer], staff videos   Frequency -- -- -- Add 2 additional days to program exercise sessions. Add 2 additional days to program exercise sessions.   Initial Home Exercises Provided -- -- --  12/29/19 12/29/19   Row Name 01/17/20 1200 02/01/20 1400 02/14/20 0800         Response to Exercise   Blood Pressure (Admit) 136/64 144/70 112/62     Blood Pressure (Exercise) 162/60 144/62 134/74     Blood Pressure (Exit) 136/60 126/74 124/70     Heart Rate (Admit) 74 bpm 88 bpm 83 bpm     Heart Rate (Exercise) 112 bpm 128 bpm 110 bpm     Heart Rate (Exit) 105 bpm 97 bpm 92 bpm     Rating of Perceived Exertion (Exercise) '15 15 14     ' Symptoms none none none     Duration Continue with 30 min of aerobic exercise without signs/symptoms of physical distress. Continue with 30 min of aerobic exercise without signs/symptoms of physical distress. Continue with 30 min of aerobic exercise without signs/symptoms of physical distress.     Intensity THRR unchanged THRR unchanged THRR unchanged        Progression   Progression Continue to progress workloads to maintain intensity without signs/symptoms of physical distress. Continue to progress workloads to maintain intensity without signs/symptoms of physical distress. Continue to progress workloads to maintain intensity without signs/symptoms of physical distress.     Average METs 3.76 4.6 3.53       Resistance Training   Training Prescription Yes Yes Yes     Weight 10 lb 10 lb 10 lb     Reps 10-15 10-15 10-15       Interval Training   Interval Training No No No       Treadmill   MPH 3.5 3.6 3.6     Grade 3.5 4 4.5     Minutes '15 15 15     ' METs 5.35 5.74 5.99       Recumbant Elliptical   Level -- -- 3     Minutes -- -- 15     METs -- -- 2.1       Elliptical   Level 1 1 --     Speed 3.5 3.5 --     Minutes 15 15 --     METs 3 -- --       REL-XR   Level -- -- 5     Minutes -- -- 15     METs -- -- 2.5       T5 Nustep   Level 5 -- --     Minutes 15 -- --     METs 2.9 -- --       Home Exercise Plan   Plans to continue exercise at Home (comment)  walking, [employer], staff videos -- Home (comment)  walking, [employer], staff videos     Frequency Add 2 additional days to program exercise sessions. -- Add 2 additional days to program exercise sessions.     Initial Home Exercises Provided 12/29/19 -- 12/29/19            Exercise Comments:   Exercise Goals and Review:  Exercise Goals    Row Name 11/22/19 1317             Exercise Goals   Increase Physical Activity Yes       Intervention Provide advice, education, support and counseling about physical activity/exercise needs.;Develop an individualized exercise prescription for aerobic and resistive training based on initial evaluation findings, risk stratification, comorbidities and participant's personal goals.       Expected Outcomes Short Term: Attend rehab on a regular basis to increase amount  of physical activity.;Long Term: Add in home exercise to make exercise  part of routine and to increase amount of physical activity.;Long Term: Exercising regularly at least 3-5 days a week.       Increase Strength and Stamina Yes       Intervention Provide advice, education, support and counseling about physical activity/exercise needs.;Develop an individualized exercise prescription for aerobic and resistive training based on initial evaluation findings, risk stratification, comorbidities and participant's personal goals.       Expected Outcomes Short Term: Increase workloads from initial exercise prescription for resistance, speed, and METs.;Short Term: Perform resistance training exercises routinely during rehab and add in resistance training at home;Long Term: Improve cardiorespiratory fitness, muscular endurance and strength as measured by increased METs and functional capacity (6MWT)       Able to understand and use rate of perceived exertion (RPE) scale Yes       Intervention Provide education and explanation on how to use RPE scale       Expected Outcomes Short Term: Able to use RPE daily in rehab to express subjective intensity level;Long Term:  Able to use RPE to guide intensity level when exercising independently       Able to understand and use Dyspnea scale Yes       Intervention Provide education and explanation on how to use Dyspnea scale       Expected Outcomes Short Term: Able to use Dyspnea scale daily in rehab to express subjective sense of shortness of breath during exertion;Long Term: Able to use Dyspnea scale to guide intensity level when exercising independently       Knowledge and understanding of Target Heart Rate Range (THRR) Yes       Intervention Provide education and explanation of THRR including how the numbers were predicted and where they are located for reference       Expected Outcomes Short Term: Able to state/look up THRR;Short Term: Able to use daily as guideline for intensity in rehab;Long Term: Able to use THRR to govern intensity when  exercising independently       Able to check pulse independently Yes       Intervention Provide education and demonstration on how to check pulse in carotid and radial arteries.;Review the importance of being able to check your own pulse for safety during independent exercise       Expected Outcomes Short Term: Able to explain why pulse checking is important during independent exercise;Long Term: Able to check pulse independently and accurately       Understanding of Exercise Prescription Yes       Intervention Provide education, explanation, and written materials on patient's individual exercise prescription       Expected Outcomes Short Term: Able to explain program exercise prescription;Long Term: Able to explain home exercise prescription to exercise independently              Exercise Goals Re-Evaluation :  Exercise Goals Re-Evaluation    Row Name 11/23/19 1201 12/04/19 1131 12/22/19 1037 12/29/19 0847 01/03/20 1446     Exercise Goal Re-Evaluation   Exercise Goals Review Increase Physical Activity;Able to understand and use rate of perceived exertion (RPE) scale;Knowledge and understanding of Target Heart Rate Range (THRR);Understanding of Exercise Prescription;Increase Strength and Stamina;Able to check pulse independently Increase Physical Activity;Increase Strength and Stamina;Able to understand and use rate of perceived exertion (RPE) scale;Understanding of Exercise Prescription Increase Physical Activity;Increase Strength and Stamina;Understanding of Exercise Prescription Increase Physical Activity;Increase Strength and Stamina;Understanding  of Exercise Prescription;Able to understand and use rate of perceived exertion (RPE) scale;Able to understand and use Dyspnea scale;Knowledge and understanding of Target Heart Rate Range (THRR);Able to check pulse independently Increase Physical Activity;Increase Strength and Stamina;Able to understand and use rate of perceived exertion (RPE)  scale;Able to understand and use Dyspnea scale;Knowledge and understanding of Target Heart Rate Range (THRR);Able to check pulse independently;Understanding of Exercise Prescription   Comments Reviewed RPE scale, THR and program prescription with pt today.  Pt voiced understanding and was given a copy of goals to take home. Patient needed a better understanding of the RPE scale. Informed patient how to gauge how hard he is working. Patient verbalizes understanding. Connor Tyler is walking every afternoon two miles a day when he is not in [facility]. He wants to gain some strength while he is in the program. Connor Tyler is doing well in rehab.  He is now able to play a full 18 holes and not get tired.  He was very excited about this and was even able to beat his partner!  He is now up to 3.4 mph on the treadmill as well.  We will conitnue to monitor his progress. Reviewed home exercise with pt today.  Pt plans to use home gym equipment and continue walking at home for exercise.  Reviewed THR, pulse, RPE, sign and symptoms, NTG use, and when to call 911 or MD.  Also discussed weather considerations and indoor options.  Pt voiced understanding. Connor Tyler is making steady progress.  He has moved up to 10 lb for strength work.  Staff will monitor progress.   Expected Outcomes Short: Use RPE daily to regulate intensity. Long: Follow program prescription in THR. Short: work on [employer] for resistance exercise. Long: Exercise post [employer] with weights independently Short: Continue to progress workloads  Long; Continue to build strength and stamina for golf. Short: Start to add in exercise at home  Long: Continue to improve stamina. Short: continue to attend consistently Long: improve overall MET level   Row Name 01/10/20 0844 01/17/20 1243 02/01/20 1429 02/12/20 0948       Exercise Goal Re-Evaluation   Exercise Goals Review Increase Physical Activity;Increase Strength and Stamina;Able to understand and use rate of  perceived exertion (RPE) scale;Able to understand and use Dyspnea scale;Knowledge and understanding of Target Heart Rate Range (THRR);Able to check pulse independently;Understanding of Exercise Prescription Increase Physical Activity;Increase Strength and Stamina;Understanding of Exercise Prescription Increase Physical Activity;Increase Strength and Stamina;Understanding of Exercise Prescription Increase Physical Activity;Increase Strength and Stamina;Understanding of Exercise Prescription    Comments Connor Tyler is feeling great - able to play golf without getting too fatigued.  he does exercise at home on days not at [facility]. Connor Tyler has been doing well in rehab.  He is up to 3.5 mph on the treadmill now and level 5 on the T5 NuStep.  We will continue to monitor his progression. Connor Tyler continues to make steady progress with exercise.  He has added the elliptical to his program.  He is using 10 lb weights fro strength work. Connor Tyler improved his post 6MWT by 292 ft!!  He is planning to continue to exercise in his basement like he has been doing. He is also considering the [facility] as an option after graduation.  He is feling better overall and has more energy.    Expected Outcomes Short: attend consistently Long:  improve MET level Short: Continue to improve endurance on elliptical  Long: Continue to improve stamina. Short: continue to attend and work  in THR range Long:  improve overall stamina Short: Graduate Long: Continue to exercise independently.           Discharge Exercise Prescription (Final Exercise Prescription Changes):  Exercise Prescription Changes - 02/14/20 0800      Response to Exercise   Blood Pressure (Admit) 112/62    Blood Pressure (Exercise) 134/74    Blood Pressure (Exit) 124/70    Heart Rate (Admit) 83 bpm    Heart Rate (Exercise) 110 bpm    Heart Rate (Exit) 92 bpm    Rating of Perceived Exertion (Exercise) 14    Symptoms none    Duration Continue with 30 min of aerobic exercise without  signs/symptoms of physical distress.    Intensity THRR unchanged      Progression   Progression Continue to progress workloads to maintain intensity without signs/symptoms of physical distress.    Average METs 3.53      Resistance Training   Training Prescription Yes    Weight 10 lb    Reps 10-15      Interval Training   Interval Training No      Treadmill   MPH 3.6    Grade 4.5    Minutes 15    METs 5.99      Recumbant Elliptical   Level 3    Minutes 15    METs 2.1      REL-XR   Level 5    Minutes 15    METs 2.5      Home Exercise Plan   Plans to continue exercise at Home (comment)   walking, [employer], staff videos   Frequency Add 2 additional days to program exercise sessions.    Initial Home Exercises Provided 12/29/19           Nutrition:  Target Goals: Understanding of nutrition guidelines, daily intake of sodium <1541m, cholesterol <2022m calories 30% from fat and 7% or less from saturated fats, daily to have 5 or more servings of fruits and vegetables.  Education: Controlling Sodium/Reading Food Labels -Group verbal and written material supporting the discussion of sodium use in heart healthy nutrition. Review and explanation with models, verbal and written materials for utilization of the food label.   Education: General Nutrition Guidelines/Fats and Fiber: -Group instruction provided by verbal, written material, models and posters to present the general guidelines for heart healthy nutrition. Gives an explanation and review of dietary fats and fiber.   Biometrics:  Pre Biometrics - 11/22/19 1317      Pre Biometrics   Height 5' 8.5" (1.74 m)    Weight 198 lb 14.4 oz (90.2 kg)    BMI (Calculated) 29.8    Single Leg Stand 30 seconds            Nutrition Therapy Plan and Nutrition Goals:  Nutrition Therapy & Goals - 11/30/19 1123      Nutrition Therapy   Diet HH, Low Na    Protein (specify units) 70-75g    Fiber 30 grams    Whole Grain  Foods 3 servings    Saturated Fats 12 max. grams    Fruits and Vegetables 5 servings/day    Sodium 1.5 grams      Personal Nutrition Goals   Nutrition Goal ST: add peanut butter to apple or banana snack LT: get back in shape 6/10 now    Comments B: bowl of oatmeal with milk (2%) and agave and cup of coffee (decaf). S: apple or banana D: fish, 2-3x  chicken, steak 1x/week. Pt just bought 2 containers of crunchy jif, told pt he could use those, but to choose a peanut butter with just peanuts after finished. Discussed HH eating.      Intervention Plan   Intervention Nutrition handout(s) given to patient.;Prescribe, educate and counsel regarding individualized specific dietary modifications aiming towards targeted core components such as weight, hypertension, lipid management, diabetes, heart failure and other comorbidities.    Expected Outcomes Short Term Goal: Understand basic principles of dietary content, such as calories, fat, sodium, cholesterol and nutrients.;Short Term Goal: A plan has been developed with personal nutrition goals set during dietitian appointment.;Long Term Goal: Adherence to prescribed nutrition plan.           Nutrition Assessments:  Nutrition Assessments - 11/22/19 1253      MEDFICTS Scores   Pre Score 100           MEDIFICTS Score Key:          ?70 Need to make dietary changes          40-70 Heart Healthy Diet         ? 40 Therapeutic Level Cholesterol Diet  Nutrition Goals Re-Evaluation:  Nutrition Goals Re-Evaluation    Reading Name 01/03/20 0839 02/12/20 0953           Goals   Nutrition Goal ST: eat small snack, fat with dinner, or more at dinner so as to not get hungry at night.  LT: get back in shape 6/10 now --      Comment added peanut butter to apple or banana snack, pt reports that this has been helping with midday hunger and he doesn't get hungry again until dinner. Pt reports sometimes very hungry can't sleep about 2 hours after dinner, suggested  having a small snack, adding some fat to dinner or eating a little past initial satisfaction to help prevent this. Pt reports losing weight, but his wife hasnt. Discussed set-point, weight retention differences between men and women as well as our body's natural weight maintenance mechanisms and added muscle tissue with exercise. Encouraged pt that these heart healthy lifestyle changes will help overall health independant of weight loss. Pt reports not wanting to make any addiitonal changes at this time. Connor Tyler will be graduating soon and has made good changes in his diet.  He plans to stick with his changes and follow a heart healthy diet going forward.      Expected Outcome ST: eat small snack, fat with dinner, or more at dinner so as to not get hungry at night.  LT: get back in shape 6/10 now Continue with weight loss and heart healthy diet.             Nutrition Goals Discharge (Final Nutrition Goals Re-Evaluation):  Nutrition Goals Re-Evaluation - 02/12/20 0953      Goals   Comment Connor Tyler will be graduating soon and has made good changes in his diet.  He plans to stick with his changes and follow a heart healthy diet going forward.    Expected Outcome Continue with weight loss and heart healthy diet.           Psychosocial: Target Goals: Acknowledge presence or absence of significant depression and/or stress, maximize coping skills, provide positive support system. Participant is able to verbalize types and ability to use techniques and skills needed for reducing stress and depression.   Education: Depression - Provides group verbal and written instruction on the correlation between heart/lung disease and  depressed mood, treatment options, and the stigmas associated with seeking treatment.   Education: Sleep Hygiene -Provides group verbal and written instruction about how sleep can affect your health.  Define sleep hygiene, discuss sleep cycles and impact of sleep habits. Review good  sleep hygiene tips.     Education: Stress and Anxiety: - Provides group verbal and written instruction about the health risks of elevated stress and causes of high stress.  Discuss the correlation between heart/lung disease and anxiety and treatment options. Review healthy ways to manage with stress and anxiety.    Initial Review & Psychosocial Screening:  Initial Psych Review & Screening - 11/21/19 1310      Initial Review   Current issues with Current Sleep Concerns;Current Stress Concerns    Source of Stress Concerns Financial    Comments He has cut back on work from [employer] and his stress levels have been lower.      Family Dynamics   Good Support System? Yes    Comments He can look to his wife and son.      Barriers   Psychosocial barriers to participate in program There are no identifiable barriers or psychosocial needs.;The patient should benefit from training in stress management and relaxation.      Screening Interventions   Interventions Encouraged to exercise;To provide support and resources with identified psychosocial needs;Provide feedback about the scores to participant    Expected Outcomes Short Term goal: Utilizing psychosocial counselor, staff and physician to assist with identification of specific Stressors or current issues interfering with healing process. Setting desired goal for each stressor or current issue identified.;Long Term Goal: Stressors or current issues are controlled or eliminated.;Short Term goal: Identification and review with participant of any Quality of Life or Depression concerns found by scoring the questionnaire.;Long Term goal: The participant improves quality of Life and PHQ9 Scores as seen by post scores and/or verbalization of changes           Quality of Life Scores:   Quality of Life - 11/22/19 1321      Quality of Life   Select Quality of Life      Quality of Life Scores   Health/Function Pre 27.2 %    Socioeconomic Pre  29.29 %    Psych/Spiritual Pre 29.14 %    Family Pre 29.38 %    GLOBAL Pre 28.32 %          Scores of 19 and below usually indicate a poorer quality of life in these areas.  A difference of  2-3 points is a clinically meaningful difference.  A difference of 2-3 points in the total score of the Quality of Life Index has been associated with significant improvement in overall quality of life, self-image, physical symptoms, and general health in studies assessing change in quality of life.  PHQ-9: Recent Review Flowsheet Data    Depression screen [facility] 2/9 11/22/2019   Decreased Interest 0   Down, Depressed, Hopeless 0   PHQ - 2 Score 0   Altered sleeping 0   Tired, decreased energy 1   Feeling bad or failure about yourself  0   Trouble concentrating 0   Moving slowly or fidgety/restless 0   Suicidal thoughts 0   PHQ-9 Score 1   Difficult doing work/chores Not difficult at all     Interpretation of Total Score  Total Score Depression Severity:  1-4 = Minimal depression, 5-9 = Mild depression, 10-14 = Moderate depression, 15-19 = Moderately severe depression,  20-27 = Severe depression   Psychosocial Evaluation and Intervention:  Psychosocial Evaluation - 02/12/20 0951      Discharge Psychosocial Assessment & Intervention   Comments Connor Tyler will be graduating soon.  He has enjoyed coming to rehab.  He has better attitude and feels good overall.  He has more energy and feels great overall.  He has an exercise room in his basement and considering the [facility] as well.  He is pleased with his progress.           Psychosocial Re-Evaluation:  Psychosocial Re-Evaluation    Row Name 12/04/19 1136 01/10/20 0843           Psychosocial Re-Evaluation   Current issues with None Identified Current Stress Concerns      Comments Connor Tyler is sleeping well and has a positive attitude. He has sold a house this week and is feeling good about it. Connor Tyler states he feels great - no stress - enjoying  life.      Expected Outcomes Short: Attend [facility] stress management education to decrease stress. Long: Maintain exercise Post [facility] to keep stress at a minimum. Short: continue to exercise and enjoy golf to keep stress low Long: maintain positive attitude      Interventions Encouraged to attend Cardiac Rehabilitation for the exercise --      Continue Psychosocial Services  Follow up required by staff --             Psychosocial Discharge (Final Psychosocial Re-Evaluation):  Psychosocial Re-Evaluation - 01/10/20 0843      Psychosocial Re-Evaluation   Current issues with Current Stress Concerns    Comments Connor Tyler states he feels great - no stress - enjoying life.    Expected Outcomes Short: continue to exercise and enjoy golf to keep stress low Long: maintain positive attitude           Vocational Rehabilitation: Provide vocational rehab assistance to qualifying candidates.   Vocational Rehab Evaluation & Intervention:   Education: Education Goals: Education classes will be provided on a variety of topics geared toward better understanding of heart health and risk factor modification. Participant will state understanding/return demonstration of topics presented as noted by education test scores.  Learning Barriers/Preferences:  Learning Barriers/Preferences - 11/21/19 1313      Learning Barriers/Preferences   Learning Barriers Sight    Learning Preferences None           General Cardiac Education Topics:  AED/CPR: - Group verbal and written instruction with the use of models to demonstrate the basic use of the AED with the basic ABC's of resuscitation.   Anatomy & Physiology of the Heart: - Group verbal and written instruction and models provide basic cardiac anatomy and physiology, with the coronary electrical and arterial systems. Review of Valvular disease and Heart Failure   Cardiac Procedures: - Group verbal and written instruction to review commonly  prescribed medications for heart disease. Reviews the medication, class of the drug, and side effects. Includes the steps to properly store meds and maintain the prescription regimen. (beta blockers and nitrates)   Cardiac Medications I: - Group verbal and written instruction to review commonly prescribed medications for heart disease. Reviews the medication, class of the drug, and side effects. Includes the steps to properly store meds and maintain the prescription regimen.   Cardiac Medications II: -Group verbal and written instruction to review commonly prescribed medications for heart disease. Reviews the medication, class of the drug, and side effects. (all other drug classes)  Go Sex-Intimacy & Heart Disease, Get SMART - Goal Setting: - Group verbal and written instruction through game format to discuss heart disease and the return to sexual intimacy. Provides group verbal and written material to discuss and apply goal setting through the application of the S.M.A.R.T. Method.   Other Matters of the Heart: - Provides group verbal, written materials and models to describe Stable Angina and Peripheral Artery. Includes description of the disease process and treatment options available to the cardiac patient.   Infection Prevention: - Provides verbal and written material to individual with discussion of infection control including proper hand washing and proper equipment cleaning during exercise session.   Cardiac Rehab from 11/22/2019 in [facility]  Date 11/22/19  Educator AS  Instruction Review Code 1- Verbalizes Understanding      Falls Prevention: - Provides verbal and written material to individual with discussion of falls prevention and safety.   Cardiac Rehab from 11/22/2019 in [facility]  Date 11/22/19  Educator AS  Instruction Review Code 1- Verbalizes Understanding      Other: -Provides group and verbal instruction on  various topics (see comments)   Knowledge Questionnaire Score:  Knowledge Questionnaire Score - 11/22/19 1321      Knowledge Questionnaire Score   Pre Score 24/28           Core Components/Risk Factors/Patient Goals at Admission:  Personal Goals and Risk Factors at Admission - 11/22/19 1318      Core Components/Risk Factors/Patient Goals on Admission    Weight Management Yes    Intervention Weight Management: Develop a combined nutrition and exercise program designed to reach desired caloric intake, while maintaining appropriate intake of nutrient and fiber, sodium and fats, and appropriate energy expenditure required for the weight goal.;Weight Management/Obesity: Establish reasonable short term and long term weight goals.;Weight Management: Provide education and appropriate resources to help participant work on and attain dietary goals.    Admit Weight 198 lb 14.4 oz (90.2 kg)    Expected Outcomes Short Term: Continue to assess and modify interventions until short term weight is achieved;Weight Maintenance: Understanding of the daily nutrition guidelines, which includes 25-35% calories from fat, 7% or less cal from saturated fats, less than 245m cholesterol, less than 1.5gm of sodium, & 5 or more servings of fruits and vegetables daily;Long Term: Adherence to nutrition and physical activity/exercise program aimed toward attainment of established weight goal;Weight Loss: Understanding of general recommendations for a balanced deficit meal plan, which promotes 1-2 lb weight loss per week and includes a negative energy balance of [phone] kcal/d;Understanding recommendations for meals to include 15-35% energy as protein, 25-35% energy from fat, 35-60% energy from carbohydrates, less than 2020mof dietary cholesterol, 20-35 gm of total fiber daily    Hypertension Yes    Intervention Provide education on lifestyle modifcations including regular physical activity/exercise, weight management,  moderate sodium restriction and increased consumption of fresh fruit, vegetables, and low fat dairy, alcohol moderation, and smoking cessation.;Monitor prescription use compliance.    Expected Outcomes Short Term: Continued assessment and intervention until BP is < 140/9031mG in hypertensive participants. < 130/95m75m in hypertensive participants with diabetes, heart failure or chronic kidney disease.;Long Term: Maintenance of blood pressure at goal levels.    Intervention Provide education and support for participant on nutrition & aerobic/resistive exercise along with prescribed medications to achieve LDL <70mg51mL >40mg.71mExpected Outcomes Short Term: Participant states understanding of desired cholesterol values and  is compliant with medications prescribed. Participant is following exercise prescription and nutrition guidelines.;Long Term: Cholesterol controlled with medications as prescribed, with individualized exercise RX and with personalized nutrition plan. Value goals: LDL < 23m, HDL > 40 mg.           Education:Diabetes - Individual verbal and written instruction to review signs/symptoms of diabetes, desired ranges of glucose level fasting, after meals and with exercise. Acknowledge that pre and post exercise glucose checks will be done for 3 sessions at entry of program.   Education: Know Your Numbers and Risk Factors: -Group verbal and written instruction about important numbers in your health.  Discussion of what are risk factors and how they play a role in the disease process.  Review of Cholesterol, Blood Pressure, Diabetes, and BMI and the role they play in your overall health.   Core Components/Risk Factors/Patient Goals Review:   Goals and Risk Factor Review    Row Name 12/04/19 1137 01/10/20 0842 02/12/20 0954         Core Components/Risk Factors/Patient Goals Review   Personal Goals Review Weight Management/Obesity;Hypertension;Lipids Weight  Management/Obesity;Hypertension;Lipids Weight Management/Obesity;Hypertension;Lipids     Review SRichardson Landrywants to lose about 20 pounds. Since the start of the program he has lost 3 pounds. He checks his blood pressure twice a day at home. His heart rate has been 65 bpm at rest. SRichardson Landryis seeing steady slow weight loss.  He takes all meds as directed and feels great.  he plays golf and exercises at home ouside program sessions. SRichardson Landrycontinues to lose weight with diet and exercise.  He continues to monitor his pressures and keeps an eye on his risk factors.     Expected Outcomes Short: lose 5 pounds in couple weeks. Long: lose 20 pounds. Short: continue heart healthy eating and exercise Continue to monitor risk factors and work on weight loss.            Core Components/Risk Factors/Patient Goals at Discharge (Final Review):   Goals and Risk Factor Review - 02/12/20 0954      Core Components/Risk Factors/Patient Goals Review   Personal Goals Review Weight Management/Obesity;Hypertension;Lipids    Review SRichardson Landrycontinues to lose weight with diet and exercise.  He continues to monitor his pressures and keeps an eye on his risk factors.    Expected Outcomes Continue to monitor risk factors and work on weight loss.           ITP Comments:  ITP Comments    Row Name 11/21/19 1324 11/23/19 1200 11/29/19 1051 12/27/19 1038 01/24/20 0840   ITP Comments Virtual Orientation performed. Patient informed when to come in for RD and EP orientation. Diagnosis can be found in Care Everywhere 11/10/2019. First full day of exercise!  Patient was oriented to gym and equipment including functions, settings, policies, and procedures.  Patient's individual exercise prescription and treatment plan were reviewed.  All starting workloads were established based on the results of the 6 minute walk test done at initial orientation visit.  The plan for exercise progression was also introduced and progression will be customized  based on patient's performance and goals 30 day chart review completed. ITP sent to Dr MZachery DakinsMedical Director, for review,changes as needed and signature.  New to program 30 day chart review completed. ITP sent to Dr MZachery DakinsMedical Director, for review,changes as needed and signature. Continue with ITP if no changes requested 30 Day review completed. Medical Director review done, changes made as directed,and approval  shown by Risk analyst.   [place] Name 02/21/20 0700           ITP Comments 30 Day review completed. Medical Director ITP review done, changes made as directed, and signed approval by Medical Director.              Comments: 30 Day review completed. Medical Director ITP review done, changes made as directed, and signed approval by Medical Director.

## 2020-02-23 ENCOUNTER — Other Ambulatory Visit: Payer: Self-pay

## 2020-02-23 ENCOUNTER — Encounter: Payer: Medicare Other | Admitting: *Deleted

## 2020-02-23 DIAGNOSIS — I252 Old myocardial infarction: Secondary | ICD-10-CM | POA: Diagnosis not present

## 2020-02-23 DIAGNOSIS — Z955 Presence of coronary angioplasty implant and graft: Secondary | ICD-10-CM

## 2020-02-23 DIAGNOSIS — I214 Non-ST elevation (NSTEMI) myocardial infarction: Secondary | ICD-10-CM

## 2020-02-23 NOTE — Progress Notes (Signed)
Daily Session Note  Patient Details  Name: Connor Tyler MRN: [id number] Date of Birth: 05/20/1946 Referring Provider:     Cardiac Rehab from 11/22/2019 in [facility]  Referring Provider Arida      Encounter Date: 02/23/2020  Check In:  Session Check In - 02/23/20 0950      Check-In   Supervising physician immediately available to respond to emergencies See telemetry face sheet for immediately available ER MD    Location [facility]    Staff Present Jessica Hawkins, MA, RCEP, CCRP, CCET;Joseph Hood RCP,RRT,BSRT; , RN, BSN, CCRP    Virtual Visit No    Medication changes reported     No    Fall or balance concerns reported    No    Warm-up and Cool-down Performed on first and last piece of equipment    Resistance Training Performed Yes    VAD Patient? No    PAD/SET Patient? No      Pain Assessment   Currently in Pain? No/denies              Social History   Tobacco Use  Smoking Status Former Smoker  . Packs/day: 1.00  . Years: 13.00  . Pack years: 13.00  . Types: Cigarettes  . Quit date: 1972  . Years since quitting: 49.4  Smokeless Tobacco Never Used    Goals Met:  Independence with exercise equipment Exercise tolerated well Personal goals reviewed No report of cardiac concerns or symptoms  Goals Unmet:  Not Applicable  Comments:  [name] graduated today from  rehab with 36 sessions completed.  Details of the patient's exercise prescription and what He needs to do in order to continue the prescription and progress were discussed with patient.  Patient was given a copy of prescription and goals.  Patient verbalized understanding.  [name] plans to continue to exercise by joining a gym.    Dr. Mark Miller is Medical Director for [facility] and [name]. 

## 2020-02-23 NOTE — Progress Notes (Signed)
Cardiac Individual Treatment Plan  Patient Details  Name: Connor Tyler MRN: [id number] Date of Birth: 11/04/1945 Referring Provider:     Cardiac Rehab from 11/22/2019 in [facility] Cardiac and Pulmonary Rehab  Referring Provider Arida      Initial Encounter Date:    Cardiac Rehab from 11/22/2019 in [facility] Cardiac and Pulmonary Rehab  Date 11/22/19      Visit Diagnosis: NSTEMI (non-ST elevation myocardial infarction) [facility])  Status post coronary artery stent placement  Patient's Home Medications on Admission:  Current Outpatient Medications:    albuterol (PROAIR HFA) 108 (90 BASE) MCG/ACT inhaler, Inhale 2 puffs into the lungs every 4 (four) hours as needed for wheezing or shortness of breath. , Disp: , Rfl:    amLODipine (NORVASC) 10 MG tablet, Take 1 tablet (10 mg total) by mouth daily., Disp: 30 tablet, Rfl: 6   aspirin EC 81 MG tablet, Take 81 mg by mouth daily. , Disp: , Rfl:    atorvastatin (LIPITOR) 80 MG tablet, Take 1 tablet (80 mg total) by mouth daily., Disp: 30 tablet, Rfl: 6   carvedilol (COREG) 6.25 MG tablet, Take 1 tablet (6.25 mg total) by mouth 2 (two) times daily., Disp: 60 tablet, Rfl: 6   cetirizine (ZYRTEC) 10 MG tablet, Take 10 mg by mouth daily. , Disp: , Rfl:    Coenzyme Q10 10 MG capsule, Take 10 mg by mouth daily. , Disp: , Rfl:    lisinopril (PRINIVIL,ZESTRIL) 40 MG tablet, TAKE 1 TABLET ONE TIME DAILY, Disp: , Rfl:    Melatonin 5 MG CAPS, Take 10 mg by mouth at bedtime. , Disp: , Rfl:    Multiple Vitamins-Minerals (ZINC PO), Take by mouth. Taking 1 capsule daily, Disp: , Rfl:    nitroGLYCERIN (NITROSTAT) 0.4 MG SL tablet, Place 1 tablet (0.4 mg total) under the tongue every 5 (five) minutes as needed for chest pain., Disp: 25 tablet, Rfl: 3   pantoprazole (PROTONIX) 40 MG tablet, Take 1 tablet (40 mg total) by mouth daily., Disp: 30 tablet, Rfl: 6   phenylephrine-shark liver oil-mineral oil-petrolatum (PREPARATION H) 0.25-3-14-71.9 % rectal  ointment, Place 1 application rectally 2 (two) times daily as needed for hemorrhoids., Disp: , Rfl:    ticagrelor (BRILINTA) 90 MG TABS tablet, Take 1 tablet (90 mg total) by mouth 2 (two) times daily., Disp: 60 tablet, Rfl: 6   vitamin B-12 (CYANOCOBALAMIN) 500 MCG tablet, Take 500 mcg by mouth daily., Disp: , Rfl:    vitamin C (ASCORBIC ACID) 500 MG tablet, Take 500 mg by mouth daily., Disp: , Rfl:   Past Medical History: Past Medical History:  Diagnosis Date   Allergic rhinitis    Anemia    Asthma    Coronary artery disease 11/11   Diffuse mild 3 vessell CAD by cardiac cath @ the [facility]   ED (erectile dysfunction)    GERD (gastroesophageal reflux disease)    Heart murmur    History of MRSA infection    Hypertension    Mitral regurgitation    Valvular heart disease     Tobacco Use: Social History   Tobacco Use  Smoking Status Former Smoker   Packs/day: 1.00   Years: 13.00   Pack years: 13.00   Types: Cigarettes   Quit date: 1972   Years since quitting: 49.4  Smokeless Tobacco Never Used    Labs: Recent Merchant [employer] for [employer] Cardiac and Pulmonary Rehab Latest Ref Rng & Units 12/28/2019   Cholestrol  100 - 199 mg/dL 89(L)   LDLCALC 0 - 99 mg/dL 37   HDL >39 mg/dL 41   Trlycerides 0 - 149 mg/dL 38       Exercise Target Goals: Exercise Program Goal: Individual exercise prescription set using results from initial 6 min walk test and THRR while considering  patients activity barriers and safety.   Exercise Prescription Goal: Initial exercise prescription builds to 30-45 minutes a day of aerobic activity, 2-3 days per week.  Home exercise guidelines will be given to patient during program as part of exercise prescription that the participant will acknowledge.   Education: Aerobic Exercise & Resistance Training: - Gives group verbal and written instruction on the various components of exercise. Focuses on aerobic and  resistive training programs and the benefits of this training and how to safely progress through these programs..   Education: Exercise & Equipment Safety: - Individual verbal instruction and demonstration of equipment use and safety with use of the equipment.   Cardiac Rehab from 11/22/2019 in [facility] Cardiac and Pulmonary Rehab  Date 11/22/19  Educator AS  Instruction Review Code 1- Verbalizes Understanding      Education: Exercise Physiology & General Exercise Guidelines: - Group verbal and written instruction with models to review the exercise physiology of the cardiovascular system and associated critical values. Provides general exercise guidelines with specific guidelines to those with heart or lung disease.    Education: Flexibility, Balance, Mind/Body Relaxation: Provides group verbal/written instruction on the benefits of flexibility and balance training, including mind/body exercise modes such as yoga, pilates and tai chi.  Demonstration and skill practice provided.   Activity Barriers & Risk Stratification:   6 Minute Walk:  6 Minute Walk    Row Name 11/22/19 1311 02/12/20 0946       6 Minute Walk   Phase Initial Discharge    Distance 1698 feet 1990 feet    Distance % Change -- 17.2 %    Distance Feet Change -- 292 ft    Walk Time 6 minutes 6 minutes    # of Rest Breaks 0 0    MPH 3.2 3.77    METS 3.33 4.13    RPE 9 10    Perceived Dyspnea  0 0    VO2 Peak 11.7 14.45    Symptoms No No    Resting HR 78 bpm 88 bpm    Resting BP 134/66 112/62    Resting Oxygen Saturation  96 % --    Exercise Oxygen Saturation  during 6 min walk 98 % --    Max Ex. HR 104 bpm 110 bpm    Max Ex. BP 122/56 134/74    2 Minute Post BP 116/60 --           Oxygen Initial Assessment:   Oxygen Re-Evaluation:   Oxygen Discharge (Final Oxygen Re-Evaluation):   Initial Exercise Prescription:  Initial Exercise Prescription - 11/22/19 1300      Date of Initial Exercise RX and  Referring Provider   Date 11/22/19    Referring Provider Arida      Treadmill   MPH 3    Grade 0    Minutes 15    METs 3.33      Recumbant Bike   Level 3    RPM 60    Watts 38    Minutes 15    METs 3.33      Elliptical   Level 1    Speed 3  Minutes 15      REL-XR   Level 3    Speed 50    Minutes 15    METs 3.3      Prescription Details   Frequency (times per week) 3    Duration Progress to 30 minutes of continuous aerobic without signs/symptoms of physical distress      Intensity   THRR 40-80% of Max Heartrate 106-134    Ratings of Perceived Exertion 11-15    Perceived Dyspnea 0-4      Resistance Training   Training Prescription Yes    Weight 6 lb    Reps 10-15           Perform Capillary Blood Glucose checks as needed.  Exercise Prescription Changes:  Exercise Prescription Changes    Row Name 11/22/19 1300 12/06/19 1600 12/22/19 1000 12/29/19 0800 01/03/20 1400     Response to Exercise   Blood Pressure (Admit) 134/66 110/70 130/64 -- 122/70   Blood Pressure (Exercise) 122/56 122/70 158/64 -- 150/88   Blood Pressure (Exit) 116/60 128/82 120/60 -- 136/64   Heart Rate (Admit) 78 bpm 77 bpm 79 bpm -- 74 bpm   Heart Rate (Exercise) 104 bpm 102 bpm 107 bpm -- 113 bpm   Heart Rate (Exit) 86 bpm 88 bpm 91 bpm -- 92 bpm   Oxygen Saturation (Admit) 96 % -- -- -- --   Oxygen Saturation (Exercise) 98 % -- -- -- --   Rating of Perceived Exertion (Exercise) '9 12 14 '$ -- 14   Perceived Dyspnea (Exercise) 0 -- -- -- --   Symptoms none none none -- none   Duration -- Continue with 30 min of aerobic exercise without signs/symptoms of physical distress. Continue with 30 min of aerobic exercise without signs/symptoms of physical distress. -- Continue with 30 min of aerobic exercise without signs/symptoms of physical distress.   Intensity -- THRR unchanged THRR unchanged -- THRR unchanged     Progression   Progression -- Continue to progress workloads to maintain  intensity without signs/symptoms of physical distress. Continue to progress workloads to maintain intensity without signs/symptoms of physical distress. -- Continue to progress workloads to maintain intensity without signs/symptoms of physical distress.   Average METs -- 3.8 4.27 -- 4.4     Resistance Training   Training Prescription -- Yes Yes -- Yes   Weight -- 6 lb 6 lb -- 10 lb   Reps -- 10-15 10-15 -- 10-15     Interval Training   Interval Training -- No No -- --     Treadmill   MPH -- 3.1 3.4 -- 3.4   Grade -- 1 3 -- 3   Minutes -- 15 15 -- 15   METs -- 3.8 5.01 -- 5.01     REL-XR   Level -- 3 4 -- --   Minutes -- 15 15 -- --   METs -- 3.8 4.4 -- --     T5 Nustep   Level -- -- 4 -- 5   SPM -- -- -- -- 80   Minutes -- -- 15 -- 15   METs -- -- 3.4 -- 3.8     Home Exercise Plan   Plans to continue exercise at -- -- -- Home (comment)  walking, Connor Tyler, staff videos Home (comment)  walking, [employer], staff videos   Frequency -- -- -- Add 2 additional days to program exercise sessions. Add 2 additional days to program exercise sessions.   Initial Home Exercises Provided -- -- --  12/29/19 12/29/19   Row Name 01/17/20 1200 02/01/20 1400 02/14/20 0800         Response to Exercise   Blood Pressure (Admit) 136/64 144/70 112/62     Blood Pressure (Exercise) 162/60 144/62 134/74     Blood Pressure (Exit) 136/60 126/74 124/70     Heart Rate (Admit) 74 bpm 88 bpm 83 bpm     Heart Rate (Exercise) 112 bpm 128 bpm 110 bpm     Heart Rate (Exit) 105 bpm 97 bpm 92 bpm     Rating of Perceived Exertion (Exercise) _0 Symptoms none none none     Duration Continue with 30 min of aerobic exercise without signs/symptoms of physical distress. Continue with 30 min of aerobic exercise without signs/symptoms of physical distress. Continue with 30 min of aerobic exercise without signs/symptoms of physical distress.     Intensity THRR unchanged THRR unchanged THRR unchanged        Progression   Progression Continue to progress workloads to maintain intensity without signs/symptoms of physical distress. Continue to progress workloads to maintain intensity without signs/symptoms of physical distress. Continue to progress workloads to maintain intensity without signs/symptoms of physical distress.     Average METs 3.76 4.6 3.53       Resistance Training   Training Prescription Yes Yes Yes     Weight 10 lb 10 lb 10 lb     Reps 10-15 10-15 10-15       Interval Training   Interval Training No No No       Treadmill   MPH 3.5 3.6 3.6     Grade 3.5 4 4.5     Minutes _1 METs 5.35 5.74 5.99       Recumbant Elliptical   Level -- -- 3     Minutes -- -- 15     METs -- -- 2.1       Elliptical   Level 1 1 --     Speed 3.5 3.5 --     Minutes 15 15 --     METs 3 -- --       REL-XR   Level -- -- 5     Minutes -- -- 15     METs -- -- 2.5       T5 Nustep   Level 5 -- --     Minutes 15 -- --     METs 2.9 -- --       Home Exercise Plan   Plans to continue exercise at Home (comment)  walking, Connor Tyler, staff videos -- Home (comment)  walking, [employer], staff videos     Frequency Add 2 additional days to program exercise sessions. -- Add 2 additional days to program exercise sessions.     Initial Home Exercises Provided 12/29/19 -- 12/29/19            Exercise Comments:  Exercise Comments    Row Name 02/23/20 8299           Exercise Comments Connor Tyler graduated today from  rehab with 36 sessions completed.  Details of the patient's exercise prescription and what He needs to do in order to continue the prescription and progress were discussed with patient.  Patient was given a copy of prescription and goals.  Patient verbalized understanding.  Connor Tyler plans to continue to exercise by joining a gym.  Exercise Goals and Review:  Exercise Goals    Row Name 11/22/19 1317             Exercise Goals   Increase Physical Activity Yes        Intervention Provide advice, education, support and counseling about physical activity/exercise needs.;Develop an individualized exercise prescription for aerobic and resistive training based on initial evaluation findings, risk stratification, comorbidities and participant's personal goals.       Expected Outcomes Short Term: Attend rehab on a regular basis to increase amount of physical activity.;Long Term: Add in home exercise to make exercise part of routine and to increase amount of physical activity.;Long Term: Exercising regularly at least 3-5 days a week.       Increase Strength and Stamina Yes       Intervention Provide advice, education, support and counseling about physical activity/exercise needs.;Develop an individualized exercise prescription for aerobic and resistive training based on initial evaluation findings, risk stratification, comorbidities and participant's personal goals.       Expected Outcomes Short Term: Increase workloads from initial exercise prescription for resistance, speed, and METs.;Short Term: Perform resistance training exercises routinely during rehab and add in resistance training at home;Long Term: Improve cardiorespiratory fitness, muscular endurance and strength as measured by increased METs and functional capacity (6MWT)       Able to understand and use rate of perceived exertion (RPE) scale Yes       Intervention Provide education and explanation on how to use RPE scale       Expected Outcomes Short Term: Able to use RPE daily in rehab to express subjective intensity level;Long Term:  Able to use RPE to guide intensity level when exercising independently       Able to understand and use Dyspnea scale Yes       Intervention Provide education and explanation on how to use Dyspnea scale       Expected Outcomes Short Term: Able to use Dyspnea scale daily in rehab to express subjective sense of shortness of breath during exertion;Long Term: Able to use Dyspnea scale  to guide intensity level when exercising independently       Knowledge and understanding of Target Heart Rate Range (THRR) Yes       Intervention Provide education and explanation of THRR including how the numbers were predicted and where they are located for reference       Expected Outcomes Short Term: Able to state/look up THRR;Short Term: Able to use daily as guideline for intensity in rehab;Long Term: Able to use THRR to govern intensity when exercising independently       Able to check pulse independently Yes       Intervention Provide education and demonstration on how to check pulse in carotid and radial arteries.;Review the importance of being able to check your own pulse for safety during independent exercise       Expected Outcomes Short Term: Able to explain why pulse checking is important during independent exercise;Long Term: Able to check pulse independently and accurately       Understanding of Exercise Prescription Yes       Intervention Provide education, explanation, and written materials on patient's individual exercise prescription       Expected Outcomes Short Term: Able to explain program exercise prescription;Long Term: Able to explain home exercise prescription to exercise independently              Exercise Goals Re-Evaluation :  Exercise Goals Re-Evaluation  [place] Name 11/23/19 1201 12/04/19 1131 12/22/19 1037 12/29/19 0847 01/03/20 1446     Exercise Goal Re-Evaluation   Exercise Goals Review Increase Physical Activity;Able to understand and use rate of perceived exertion (RPE) scale;Knowledge and understanding of Target Heart Rate Range (THRR);Understanding of Exercise Prescription;Increase Strength and Stamina;Able to check pulse independently Increase Physical Activity;Increase Strength and Stamina;Able to understand and use rate of perceived exertion (RPE) scale;Understanding of Exercise Prescription Increase Physical Activity;Increase Strength and  Stamina;Understanding of Exercise Prescription Increase Physical Activity;Increase Strength and Stamina;Understanding of Exercise Prescription;Able to understand and use rate of perceived exertion (RPE) scale;Able to understand and use Dyspnea scale;Knowledge and understanding of Target Heart Rate Range (THRR);Able to check pulse independently Increase Physical Activity;Increase Strength and Stamina;Able to understand and use rate of perceived exertion (RPE) scale;Able to understand and use Dyspnea scale;Knowledge and understanding of Target Heart Rate Range (THRR);Able to check pulse independently;Understanding of Exercise Prescription   Comments Reviewed RPE scale, THR and program prescription with pt today.  Pt voiced understanding and was given a copy of goals to take home. Patient needed a better understanding of the RPE scale. Informed patient how to gauge how hard he is working. Patient verbalizes understanding. Connor Tyler is walking every afternoon two miles a day when he is not in [facility]. He wants to gain some strength while he is in the program. Connor Tyler is doing well in rehab.  He is now able to play a full 18 holes and not get tired.  He was very excited about this and was even able to beat his partner!  He is now up to 3.4 mph on the treadmill as well.  We will conitnue to monitor his progress. Reviewed home exercise with pt today.  Pt plans to use home gym equipment and continue walking at home for exercise.  Reviewed THR, pulse, RPE, sign and symptoms, NTG use, and when to call 911 or MD.  Also discussed weather considerations and indoor options.  Pt voiced understanding. Connor Tyler is making steady progress.  He has moved up to 10 lb for strength work.  Staff will monitor progress.   Expected Outcomes Short: Use RPE daily to regulate intensity. Long: Follow program prescription in THR. Short: work on [employer] for resistance exercise. Long: Exercise post [employer] with weights independently Short:  Continue to progress workloads  Long; Continue to build strength and stamina for golf. Short: Start to add in exercise at home  Long: Continue to improve stamina. Short: continue to attend consistently Long: improve overall MET level   Row Name 01/10/20 0844 01/17/20 1243 02/01/20 1429 02/12/20 0948       Exercise Goal Re-Evaluation   Exercise Goals Review Increase Physical Activity;Increase Strength and Stamina;Able to understand and use rate of perceived exertion (RPE) scale;Able to understand and use Dyspnea scale;Knowledge and understanding of Target Heart Rate Range (THRR);Able to check pulse independently;Understanding of Exercise Prescription Increase Physical Activity;Increase Strength and Stamina;Understanding of Exercise Prescription Increase Physical Activity;Increase Strength and Stamina;Understanding of Exercise Prescription Increase Physical Activity;Increase Strength and Stamina;Understanding of Exercise Prescription    Comments Connor Tyler is feeling great - able to play golf without getting too fatigued.  he does exercise at home on days not at [facility]. Connor Tyler has been doing well in rehab.  He is up to 3.5 mph on the treadmill now and level 5 on the T5 NuStep.  We will continue to monitor his progression. Connor Tyler continues to make steady progress with exercise.  He has added the elliptical to  his program.  He is using 10 lb weights fro strength work. Connor Tyler improved his post 6MWT by 292 ft!!  He is planning to continue to exercise in his basement like he has been doing. He is also considering the [facility] as an option after graduation.  He is feling better overall and has more energy.    Expected Outcomes Short: attend consistently Long:  improve MET level Short: Continue to improve endurance on elliptical  Long: Continue to improve stamina. Short: continue to attend and work in [employer] range Long:  improve overall stamina Short: Graduate Long: Continue to exercise independently.           Discharge  Exercise Prescription (Final Exercise Prescription Changes):  Exercise Prescription Changes - 02/14/20 0800      Response to Exercise   Blood Pressure (Admit) 112/62    Blood Pressure (Exercise) 134/74    Blood Pressure (Exit) 124/70    Heart Rate (Admit) 83 bpm    Heart Rate (Exercise) 110 bpm    Heart Rate (Exit) 92 bpm    Rating of Perceived Exertion (Exercise) 14    Symptoms none    Duration Continue with 30 min of aerobic exercise without signs/symptoms of physical distress.    Intensity THRR unchanged      Progression   Progression Continue to progress workloads to maintain intensity without signs/symptoms of physical distress.    Average METs 3.53      Resistance Training   Training Prescription Yes    Weight 10 lb    Reps 10-15      Interval Training   Interval Training No      Treadmill   MPH 3.6    Grade 4.5    Minutes 15    METs 5.99      Recumbant Elliptical   Level 3    Minutes 15    METs 2.1      REL-XR   Level 5    Minutes 15    METs 2.5      Home Exercise Plan   Plans to continue exercise at Home (comment)   walking, [employer], staff videos   Frequency Add 2 additional days to program exercise sessions.    Initial Home Exercises Provided 12/29/19           Nutrition:  Target Goals: Understanding of nutrition guidelines, daily intake of sodium <1512m, cholesterol <2042m calories 30% from fat and 7% or less from saturated fats, daily to have 5 or more servings of fruits and vegetables.  Education: Controlling Sodium/Reading Food Labels -Group verbal and written material supporting the discussion of sodium use in heart healthy nutrition. Review and explanation with models, verbal and written materials for utilization of the food label.   Education: General Nutrition Guidelines/Fats and Fiber: -Group instruction provided by verbal, written material, models and posters to present the general guidelines for heart healthy nutrition. Gives an  explanation and review of dietary fats and fiber.   Biometrics:  Pre Biometrics - 11/22/19 1317      Pre Biometrics   Height 5' 8.5" (1.74 m)    Weight 198 lb 14.4 oz (90.2 kg)    BMI (Calculated) 29.8    Single Leg Stand 30 seconds            Nutrition Therapy Plan and Nutrition Goals:  Nutrition Therapy & Goals - 11/30/19 1123      Nutrition Therapy   Diet HH, Low Na    Protein (specify units) 70-75g  Fiber 30 grams    Whole Grain Foods 3 servings    Saturated Fats 12 max. grams    Fruits and Vegetables 5 servings/day    Sodium 1.5 grams      Personal Nutrition Goals   Nutrition Goal ST: add peanut butter to apple or banana snack LT: get back in shape 6/10 now    Comments B: bowl of oatmeal with milk (2%) and agave and cup of coffee (decaf). S: apple or banana D: fish, 2-3x chicken, steak 1x/week. Pt just bought 2 containers of crunchy jif, told pt he could use those, but to choose a peanut butter with just peanuts after finished. Discussed HH eating.      Intervention Plan   Intervention Nutrition handout(s) given to patient.;Prescribe, educate and counsel regarding individualized specific dietary modifications aiming towards targeted core components such as weight, hypertension, lipid management, diabetes, heart failure and other comorbidities.    Expected Outcomes Short Term Goal: Understand basic principles of dietary content, such as calories, fat, sodium, cholesterol and nutrients.;Short Term Goal: A plan has been developed with personal nutrition goals set during dietitian appointment.;Long Term Goal: Adherence to prescribed nutrition plan.           Nutrition Assessments:  Nutrition Assessments - 11/22/19 1253      MEDFICTS Scores   Pre Score 100           MEDIFICTS Score Key:          ?70 Need to make dietary changes          40-70 Heart Healthy Diet         ? 40 Therapeutic Level Cholesterol Diet  Nutrition Goals Re-Evaluation:  Nutrition Goals  Re-Evaluation    Row Name 01/03/20 0839 02/12/20 0953           Goals   Nutrition Goal ST: eat small snack, fat with dinner, or more at dinner so as to not get hungry at night.  LT: get back in shape 6/10 now --      Comment added peanut butter to apple or banana snack, pt reports that this has been helping with midday hunger and he doesn't get hungry again until dinner. Pt reports sometimes very hungry can't sleep about 2 hours after dinner, suggested having a small snack, adding some fat to dinner or eating a little past initial satisfaction to help prevent this. Pt reports losing weight, but his wife hasnt. Discussed set-point, weight retention differences between men and women as well as our body's natural weight maintenance mechanisms and added muscle tissue with exercise. Encouraged pt that these heart healthy lifestyle changes will help overall health independant of weight loss. Pt reports not wanting to make any addiitonal changes at this time. Connor Tyler will be graduating soon and has made good changes in his diet.  He plans to stick with his changes and follow a heart healthy diet going forward.      Expected Outcome ST: eat small snack, fat with dinner, or more at dinner so as to not get hungry at night.  LT: get back in shape 6/10 now Continue with weight loss and heart healthy diet.             Nutrition Goals Discharge (Final Nutrition Goals Re-Evaluation):  Nutrition Goals Re-Evaluation - 02/12/20 0953      Goals   Comment Connor Tyler will be graduating soon and has made good changes in his diet.  He plans to stick with his changes and  follow a heart healthy diet going forward.    Expected Outcome Continue with weight loss and heart healthy diet.           Psychosocial: Target Goals: Acknowledge presence or absence of significant depression and/or stress, maximize coping skills, provide positive support system. Participant is able to verbalize types and ability to use techniques and  skills needed for reducing stress and depression.   Education: Depression - Provides group verbal and written instruction on the correlation between heart/lung disease and depressed mood, treatment options, and the stigmas associated with seeking treatment.   Education: Sleep Hygiene -Provides group verbal and written instruction about how sleep can affect your health.  Define sleep hygiene, discuss sleep cycles and impact of sleep habits. Review good sleep hygiene tips.     Education: Stress and Anxiety: - Provides group verbal and written instruction about the health risks of elevated stress and causes of high stress.  Discuss the correlation between heart/lung disease and anxiety and treatment options. Review healthy ways to manage with stress and anxiety.    Initial Review & Psychosocial Screening:  Initial Psych Review & Screening - 11/21/19 1310      Initial Review   Current issues with Current Sleep Concerns;Current Stress Concerns    Source of Stress Concerns Financial    Comments He has cut back on work from [employer] and his stress levels have been lower.      Family Dynamics   Good Support System? Yes    Comments He can look to his wife and son.      Barriers   Psychosocial barriers to participate in program There are no identifiable barriers or psychosocial needs.;The patient should benefit from training in stress management and relaxation.      Screening Interventions   Interventions Encouraged to exercise;To provide support and resources with identified psychosocial needs;Provide feedback about the scores to participant    Expected Outcomes Short Term goal: Utilizing psychosocial counselor, staff and physician to assist with identification of specific Stressors or current issues interfering with healing process. Setting desired goal for each stressor or current issue identified.;Long Term Goal: Stressors or current issues are controlled or eliminated.;Short Term goal:  Identification and review with participant of any Quality of Life or Depression concerns found by scoring the questionnaire.;Long Term goal: The participant improves quality of Life and PHQ9 Scores as seen by post scores and/or verbalization of changes           Quality of Life Scores:   Quality of Life - 11/22/19 1321      Quality of Life   Select Quality of Life      Quality of Life Scores   Health/Function Pre 27.2 %    Socioeconomic Pre 29.29 %    Psych/Spiritual Pre 29.14 %    Family Pre 29.38 %    GLOBAL Pre 28.32 %          Scores of 19 and below usually indicate a poorer quality of life in these areas.  A difference of  2-3 points is a clinically meaningful difference.  A difference of 2-3 points in the total score of the Quality of Life Index has been associated with significant improvement in overall quality of life, self-image, physical symptoms, and general health in studies assessing change in quality of life.  PHQ-9: Recent Review Flowsheet Data    Depression screen [facility] 2/9 11/22/2019   Decreased Interest 0   Down, Depressed, Hopeless 0   PHQ -  2 Score 0   Altered sleeping 0   Tired, decreased energy 1   Feeling bad or failure about yourself  0   Trouble concentrating 0   Moving slowly or fidgety/restless 0   Suicidal thoughts 0   PHQ-9 Score 1   Difficult doing work/chores Not difficult at all     Interpretation of Total Score  Total Score Depression Severity:  1-4 = Minimal depression, 5-9 = Mild depression, 10-14 = Moderate depression, 15-19 = Moderately severe depression, 20-27 = Severe depression   Psychosocial Evaluation and Intervention:  Psychosocial Evaluation - 02/12/20 0951      Discharge Psychosocial Assessment & Intervention   Comments Connor Tyler will be graduating soon.  He has enjoyed coming to rehab.  He has better attitude and feels good overall.  He has more energy and feels great overall.  He has an exercise room in his basement and  considering the [facility] as well.  He is pleased with his progress.           Psychosocial Re-Evaluation:  Psychosocial Re-Evaluation    Row Name 12/04/19 1136 01/10/20 0843           Psychosocial Re-Evaluation   Current issues with None Identified Current Stress Concerns      Comments Connor Tyler is sleeping well and has a positive attitude. He has sold a house this week and is feeling good about it. Connor Tyler states he feels great - no stress - enjoying life.      Expected Outcomes Short: Attend HeartTrack stress management education to decrease stress. Long: Maintain exercise Post HeartTrack to keep stress at a minimum. Short: continue to exercise and enjoy golf to keep stress low Long: maintain positive attitude      Interventions Encouraged to attend Cardiac Rehabilitation for the exercise --      Continue Psychosocial Services  Follow up required by staff --             Psychosocial Discharge (Final Psychosocial Re-Evaluation):  Psychosocial Re-Evaluation - 01/10/20 0843      Psychosocial Re-Evaluation   Current issues with Current Stress Concerns    Comments Connor Tyler states he feels great - no stress - enjoying life.    Expected Outcomes Short: continue to exercise and enjoy golf to keep stress low Long: maintain positive attitude           Vocational Rehabilitation: Provide vocational rehab assistance to qualifying candidates.   Vocational Rehab Evaluation & Intervention:   Education: Education Goals: Education classes will be provided on a variety of topics geared toward better understanding of heart health and risk factor modification. Participant will state understanding/return demonstration of topics presented as noted by education test scores.  Learning Barriers/Preferences:  Learning Barriers/Preferences - 11/21/19 1313      Learning Barriers/Preferences   Learning Barriers Sight    Learning Preferences None           General Cardiac Education  Topics:  AED/CPR: - Group verbal and written instruction with the use of models to demonstrate the basic use of the AED with the basic ABC's of resuscitation.   Anatomy & Physiology of the Heart: - Group verbal and written instruction and models provide basic cardiac anatomy and physiology, with the coronary electrical and arterial systems. Review of Valvular disease and Heart Failure   Cardiac Procedures: - Group verbal and written instruction to review commonly prescribed medications for heart disease. Reviews the medication, class of the drug, and side effects. Includes the steps  to properly store meds and maintain the prescription regimen. (beta blockers and nitrates)   Cardiac Medications I: - Group verbal and written instruction to review commonly prescribed medications for heart disease. Reviews the medication, class of the drug, and side effects. Includes the steps to properly store meds and maintain the prescription regimen.   Cardiac Medications II: -Group verbal and written instruction to review commonly prescribed medications for heart disease. Reviews the medication, class of the drug, and side effects. (all other drug classes)    Go Sex-Intimacy & Heart Disease, Get SMART - Goal Setting: - Group verbal and written instruction through game format to discuss heart disease and the return to sexual intimacy. Provides group verbal and written material to discuss and apply goal setting through the application of the S.M.A.R.T. Method.   Other Matters of the Heart: - Provides group verbal, written materials and models to describe Stable Angina and Peripheral Artery. Includes description of the disease process and treatment options available to the cardiac patient.   Infection Prevention: - Provides verbal and written material to individual with discussion of infection control including proper hand washing and proper equipment cleaning during exercise session.   Cardiac Rehab  from 11/22/2019 in [facility]  Date 11/22/19  Educator AS  Instruction Review Code 1- Verbalizes Understanding      Falls Prevention: - Provides verbal and written material to individual with discussion of falls prevention and safety.   Cardiac Rehab from 11/22/2019 in [facility] Cardiac and Pulmonary Rehab  Date 11/22/19  Educator AS  Instruction Review Code 1- Verbalizes Understanding      Other: -Provides group and verbal instruction on various topics (see comments)   Knowledge Questionnaire Score:  Knowledge Questionnaire Score - 11/22/19 1321      Knowledge Questionnaire Score   Pre Score 24/28           Core Components/Risk Factors/Patient Goals at Admission:  Personal Goals and Risk Factors at Admission - 11/22/19 1318      Core Components/Risk Factors/Patient Goals on Admission    Weight Management Yes    Intervention Weight Management: Develop a combined nutrition and exercise program designed to reach desired caloric intake, while maintaining appropriate intake of nutrient and fiber, sodium and fats, and appropriate energy expenditure required for the weight goal.;Weight Management/Obesity: Establish reasonable short term and long term weight goals.;Weight Management: Provide education and appropriate resources to help participant work on and attain dietary goals.    Admit Weight 198 lb 14.4 oz (90.2 kg)    Expected Outcomes Short Term: Continue to assess and modify interventions until short term weight is achieved;Weight Maintenance: Understanding of the daily nutrition guidelines, which includes 25-35% calories from fat, 7% or less cal from saturated fats, less than '200mg'$  cholesterol, less than 1.5gm of sodium, & 5 or more servings of fruits and vegetables daily;Long Term: Adherence to nutrition and physical activity/exercise program aimed toward attainment of established weight goal;Weight Loss: Understanding of general recommendations for a balanced  deficit meal plan, which promotes 1-2 lb weight loss per week and includes a negative energy balance of [phone] kcal/d;Understanding recommendations for meals to include 15-35% energy as protein, 25-35% energy from fat, 35-60% energy from carbohydrates, less than '200mg'$  of dietary cholesterol, 20-35 gm of total fiber daily    Hypertension Yes    Intervention Provide education on lifestyle modifcations including regular physical activity/exercise, weight management, moderate sodium restriction and increased consumption of fresh fruit, vegetables, and low fat dairy, alcohol  moderation, and smoking cessation.;Monitor prescription use compliance.    Expected Outcomes Short Term: Continued assessment and intervention until BP is < 140/51m HG in hypertensive participants. < 130/876mHG in hypertensive participants with diabetes, heart failure or chronic kidney disease.;Long Term: Maintenance of blood pressure at goal levels.    Intervention Provide education and support for participant on nutrition & aerobic/resistive exercise along with prescribed medications to achieve LDL '70mg'$ , HDL >'40mg'$ .    Expected Outcomes Short Term: Participant states understanding of desired cholesterol values and is compliant with medications prescribed. Participant is following exercise prescription and nutrition guidelines.;Long Term: Cholesterol controlled with medications as prescribed, with individualized exercise RX and with personalized nutrition plan. Value goals: LDL < '70mg'$ , HDL > 40 mg.           Education:Diabetes - Individual verbal and written instruction to review signs/symptoms of diabetes, desired ranges of glucose level fasting, after meals and with exercise. Acknowledge that pre and post exercise glucose checks will be done for 3 sessions at entry of program.   Education: Know Your Numbers and Risk Factors: -Group verbal and written instruction about important numbers in your health.  Discussion of what are  risk factors and how they play a role in the disease process.  Review of Cholesterol, Blood Pressure, Diabetes, and BMI and the role they play in your overall health.   Core Components/Risk Factors/Patient Goals Review:   Goals and Risk Factor Review    Row Name 12/04/19 1137 01/10/20 0842 02/12/20 0954         Core Components/Risk Factors/Patient Goals Review   Personal Goals Review Weight Management/Obesity;Hypertension;Lipids Weight Management/Obesity;Hypertension;Lipids Weight Management/Obesity;Hypertension;Lipids     Review [employer] to lose about 20 pounds. Since the start of the program he has lost 3 pounds. He checks his blood pressure twice a day at home. His heart rate has been 65 bpm at rest. [employer] seeing steady slow weight loss.  He takes all meds as directed and feels great.  he plays golf and exercises at home ouside program sessions. [employer]ry[employer]in[facility] to lose weight with diet and exercise.  He continues to monitor his pressures and keeps an eye on his risk factors.     Expected Outcomes Short: lose 5 pounds in couple weeks. Long: lose 20 pounds. Short: continue heart healthy eating and exercise Continue to monitor risk factors and work on weight loss.            Core Components/Risk Factors/Patient Goals at Discharge (Final Review):   Goals and Risk Factor Review - 02/12/20 0954      Core Components/Risk Factors/Patient Goals Review   Personal Goals Review Weight Management/Obesity;Hypertension;Lipids    Review [employer] to lose weight with diet and exercise.  He continues to monitor his pressures and keeps an eye on his risk factors.    Expected Outcomes Continue to monitor risk factors and work on weight loss.           ITP Comments:  ITP Comments    Row Name 11/21/19 1324 11/23/19 1200 11/29/19 1051 12/27/19 1038 01/24/20 0840   ITP Comments Virtual Orientation performed. Patient informed when to come in for RD and EP orientation. Diagnosis can be found in  Care Everywhere 11/10/2019. First full day of exercise!  Patient was oriented to gym and equipment including functions, settings, policies, and procedures.  Patient's individual exercise prescription and treatment plan were reviewed.  All starting workloads were established based on the results of the 6 minute  walk test done at initial orientation visit.  The plan for exercise progression was also introduced and progression will be customized based on patient's performance and goals 30 day chart review completed. ITP sent to Dr Zachery Dakins Medical Director, for review,changes as needed and signature.  New to program 30 day chart review completed. ITP sent to Dr Zachery Dakins Medical Director, for review,changes as needed and signature. Continue with ITP if no changes requested 30 Day review completed. Medical Director review done, changes made as directed,and approval shown by signature of Market researcher.   [place] Name 02/21/20 0700 02/23/20 0950         ITP Comments 30 Day review completed. Medical Director ITP review done, changes made as directed, and signed approval by Medical Director. Willys graduated today from  rehab with 36 sessions completed.  Details of the patient's exercise prescription and what He needs to do in order to continue the prescription and progress were discussed with patient.  Patient was given a copy of prescription and goals.  Patient verbalized understanding.  Ember plans to continue to exercise by joining a gym.             Comments: Discharge ITP

## 2020-02-23 NOTE — Progress Notes (Signed)
Discharge Progress Report  Patient Details  Name: Connor Tyler MRN: [id number] Date of Birth: May 01, 1946 Referring Provider:     Cardiac Rehab from 11/22/2019 in [facility]  Referring Provider [place]       Number of Visits: 7  Reason for Discharge:  Patient reached a stable level of exercise. Patient independent in their exercise. Patient has met program and personal goals.  Smoking History:  Social History   Tobacco Use  Smoking Status Former Smoker  . Packs/day: 1.00  . Years: 13.00  . Pack years: 13.00  . Types: Cigarettes  . Quit date: 39  . Years since quitting: 49.4  Smokeless Tobacco Never Used    Diagnosis:  NSTEMI (non-ST elevation myocardial infarction) ([place])  Status post coronary artery stent placement  ADL UCSD:   Initial Exercise Prescription:  Initial Exercise Prescription - 11/22/19 1300      Date of Initial Exercise RX and Referring Provider   Date 11/22/19    Referring Provider Arida      Treadmill   MPH 3    Grade 0    Minutes 15    METs 3.33      Recumbant Bike   Level 3    RPM 60    Watts 38    Minutes 15    METs 3.33      Elliptical   Level 1    Speed 3    Minutes 15      REL-XR   Level 3    Speed 50    Minutes 15    METs 3.3      Prescription Details   Frequency (times per week) 3    Duration Progress to 30 minutes of continuous aerobic without signs/symptoms of physical distress      Intensity   THRR 40-80% of Max Heartrate 106-134    Ratings of Perceived Exertion 11-15    Perceived Dyspnea 0-4      Resistance Training   Training Prescription Yes    Weight 6 lb    Reps 10-15           Discharge Exercise Prescription (Final Exercise Prescription Changes):  Exercise Prescription Changes - 02/14/20 0800      Response to Exercise   Blood Pressure (Admit) 112/62    Blood Pressure (Exercise) 134/74    Blood Pressure (Exit) 124/70    Heart Rate (Admit) 83 bpm    Heart Rate  (Exercise) 110 bpm    Heart Rate (Exit) 92 bpm    Rating of Perceived Exertion (Exercise) 14    Symptoms none    Duration Continue with 30 min of aerobic exercise without signs/symptoms of physical distress.    Intensity THRR unchanged      Progression   Progression Continue to progress workloads to maintain intensity without signs/symptoms of physical distress.    Average METs 3.53      Resistance Training   Training Prescription Yes    Weight 10 lb    Reps 10-15      Interval Training   Interval Training No      Treadmill   MPH 3.6    Grade 4.5    Minutes 15    METs 5.99      Recumbant Elliptical   Level 3    Minutes 15    METs 2.1      REL-XR   Level 5    Minutes 15    METs 2.5  Home Exercise Plan   Plans to continue exercise at Home (comment)   walking, Jed L[place], staff videos   Frequency Add 2 additional days to program exercise sessions.    Initial Home Exercises Provided 12/29/19           Functional Capacity:  6 Minute Walk    Row Name 11/22/19 1311 02/12/20 0946       6 Minute Walk   Phase Initial Discharge    Distance 1698 feet 1990 feet    Distance % Change -- 17.2 %    Distance Feet Change -- 292 ft    Walk Time 6 minutes 6 minutes    # of Rest Breaks 0 0    MPH 3.2 3.77    METS 3.33 4.13    RPE 9 10    Perceived Dyspnea  0 0    VO2 Peak 11.7 14.45    Symptoms No No    Resting HR 78 bpm 88 bpm    Resting BP 134/66 112/62    Resting Oxygen Saturation  96 % --    Exercise Oxygen Saturation  during 6 min walk 98 % --    Max Ex. HR 104 bpm 110 bpm    Max Ex. BP 122/56 134/74    2 Minute Post BP 116/60 --           Psychological, QOL, Others - Outcomes: PHQ 2/9: Depression screen PHQ 2/9 11/22/2019  Decreased Interest 0  Down, Depressed, Hopeless 0  PHQ - 2 Score 0  Altered sleeping 0  Tired, decreased energy 1  Feeling bad or failure about yourself  0  Trouble concentrating 0  Moving slowly or fidgety/restless 0  Suicidal  thoughts 0  PHQ-9 Score 1  Difficult doing work/chores Not difficult at all      Nutrition & Weight - Outcomes:  Pre Biometrics - 11/22/19 1317      Pre Biometrics   Height 5' 8.5" (1.74 m)    Weight 198 lb 14.4 oz (90.2 kg)  Post 182.5 lb   BMI (Calculated) 29.8    Single Leg Stand 30 seconds             Goals reviewed with patient; copy given to patient.

## 2020-03-25 ENCOUNTER — Telehealth: Payer: Self-pay | Admitting: Cardiovascular Disease

## 2020-03-25 MED ORDER — CARVEDILOL 6.25 MG PO TABS: 6.2500 mg | ORAL_TABLET | Freq: Two times a day (BID) | ORAL | 0 refills | Status: DC

## 2020-03-25 MED ORDER — TICAGRELOR 90 MG PO TABS: 90.0000 mg | ORAL_TABLET | Freq: Two times a day (BID) | ORAL | 0 refills | Status: DC

## 2020-03-25 MED ORDER — ATORVASTATIN CALCIUM 80 MG PO TABS: 80.0000 mg | ORAL_TABLET | Freq: Every day | ORAL | 0 refills | Status: DC

## 2020-03-25 MED ORDER — AMLODIPINE BESYLATE 10 MG PO TABS: 10.0000 mg | ORAL_TABLET | Freq: Every day | ORAL | 0 refills | Status: DC

## 2020-03-25 MED ORDER — PANTOPRAZOLE SODIUM 40 MG PO TBEC: 40.0000 mg | DELAYED_RELEASE_TABLET | Freq: Every day | ORAL | 0 refills | Status: DC

## 2020-03-25 NOTE — Telephone Encounter (Signed)
*  STAT* If patient is at the pharmacy, call can be transferred to refill team.   1. Which medications need to be refilled? (please list name of each medication and dose if known)   All cardiac meds except lisinopril   2. Which pharmacy/location (including street and city if local pharmacy) is medication to be sent to?  New pharmacy  - [facility]   3. Do they need a 30 day or 90 day supply? 90

## 2020-03-25 NOTE — Telephone Encounter (Signed)
Requested Prescriptions   Signed Prescriptions Disp Refills   amLODipine (NORVASC) 10 MG tablet 90 tablet 0    Sig: Take 1 tablet (10 mg total) by mouth daily.    Authorizing Provider: Lorine Bears A    Ordering User: Thayer Headings, Clotile Whittington L   carvedilol (COREG) 6.25 MG tablet 180 tablet 0    Sig: Take 1 tablet (6.25 mg total) by mouth 2 (two) times daily.    Authorizing Provider: Lorine Bears A    Ordering User: Thayer Headings, Cantrell Larouche L   ticagrelor (BRILINTA) 90 MG TABS tablet 180 tablet 0    Sig: Take 1 tablet (90 mg total) by mouth 2 (two) times daily.    Authorizing Provider: Lorine Bears A    Ordering User: Thayer Headings, Rodger Giangregorio L   atorvastatin (LIPITOR) 80 MG tablet 90 tablet 0    Sig: Take 1 tablet (80 mg total) by mouth daily.    Authorizing Provider: Lorine Bears A    Ordering User: Thayer Headings, Edgardo Petrenko L   pantoprazole (PROTONIX) 40 MG tablet 90 tablet 0    Sig: Take 1 tablet (40 mg total) by mouth daily.    Authorizing Provider: Lorine Bears A    Ordering User: Thayer Headings, Teresa Lemmerman L

## 2020-05-10 ENCOUNTER — Ambulatory Visit: Payer: Medicare Other | Attending: Internal Medicine

## 2020-05-10 DIAGNOSIS — Z23 Encounter for immunization: Secondary | ICD-10-CM

## 2020-05-10 NOTE — Progress Notes (Signed)
   Covid-19 Vaccination Clinic  Name:  IMARI SIVERTSEN    MRN: [id number] DOB: February 07, 1946  05/10/2020  Mr. Spiering was observed post Covid-19 immunization for 15 minutes without incident. He was provided with Vaccine Information Sheet and instruction to access the V-Safe system.   Mr. Moulder was instructed to call 911 with any severe reactions post vaccine: Marland Kitchen Difficulty breathing  . Swelling of face and throat  . A fast heartbeat  . A bad rash all over body  . Dizziness and weakness

## 2020-05-22 ENCOUNTER — Telehealth: Payer: Self-pay | Admitting: Cardiovascular Disease

## 2020-05-22 ENCOUNTER — Other Ambulatory Visit: Payer: Self-pay | Admitting: Physician Assistant

## 2020-05-22 DIAGNOSIS — I341 Nonrheumatic mitral (valve) prolapse: Secondary | ICD-10-CM

## 2020-05-22 DIAGNOSIS — R011 Cardiac murmur, unspecified: Secondary | ICD-10-CM

## 2020-05-22 MED ORDER — PANTOPRAZOLE SODIUM 40 MG PO TBEC: 40.0000 mg | DELAYED_RELEASE_TABLET | Freq: Every day | ORAL | 0 refills | Status: DC

## 2020-05-22 NOTE — Telephone Encounter (Signed)
Spoke with the patient. Adv him that he has been prescribed Protonix 40 mg daily for his acid reflux. Patient sts that he has misplaced his prescription and has been out of the medication. Patient sts that he has been having some reflux and would like a refill.  Protonix 40 mg daily sent to the patients mail order pharmacy # 90 R-0. Patient verbalized understanding and voiced appreciation for the assistance.

## 2020-05-22 NOTE — Telephone Encounter (Signed)
Patient would like someone to call him to discuss his medication list. States he would like to know if he is taking something for GERD

## 2020-05-23 ENCOUNTER — Other Ambulatory Visit: Payer: Self-pay | Admitting: Cardiovascular Disease

## 2020-05-23 ENCOUNTER — Telehealth: Payer: Self-pay | Admitting: Cardiovascular Disease

## 2020-05-23 NOTE — Telephone Encounter (Signed)
° °  [facility] Pre-operative Risk Assessment    HEARTCARE STAFF: - Please ensure there is not already an duplicate clearance open for this procedure. - Under Visit Info/Reason for Call, type in Other and utilize the format Clearance MM/DD/YY or Clearance TBD. Do not use dashes or single digits. - If request is for dental extraction, please clarify the # of teeth to be extracted.  Request for surgical clearance:  1. What type of surgery is being performed? Dental Cleaning and exam    2. When is this surgery scheduled? 06-26-20   3. What type of clearance is required (medical clearance vs. Pharmacy clearance to hold med vs. Both)? Card clearance / prophylaxis recommendation   4. Are there any medications that need to be held prior to surgery and how long? Please advise   5. Practice name and name of physician performing surgery? Dr. Eugenie Birks   6. What is the office phone number? [phone]   7.   What is the office fax number? [phone]  8.   Anesthesia type (None, local, MAC, general) ? no   Clarisse Gouge 05/23/2020, 8:53 AM  _________________________________________________________________   (provider comments below)

## 2020-05-23 NOTE — Telephone Encounter (Signed)
   Primary Cardiologist: Lorine Bears, MD  Chart reviewed as part of pre-operative protocol coverage.   Simple dental extractions and cleanins are considered low risk procedures per guidelines and generally do not require any specific cardiac clearance. It is also generally accepted that for simple extractions and dental cleanings, there is no need to interrupt blood thinner therapy.   SBE prophylaxis is not required for the patient.  I will route this recommendation to the requesting party via Epic fax function and remove from pre-op pool.  Please call with questions.  Beatriz Stallion, PA-C 05/23/2020, 9:27 AM

## 2020-05-29 ENCOUNTER — Other Ambulatory Visit: Payer: Medicare Other

## 2020-06-12 ENCOUNTER — Telehealth: Payer: Self-pay | Admitting: Cardiovascular Disease

## 2020-06-12 MED ORDER — CARVEDILOL 6.25 MG PO TABS: ORAL_TABLET | ORAL | 0 refills | Status: DC

## 2020-06-12 MED ORDER — ATORVASTATIN CALCIUM 80 MG PO TABS: ORAL_TABLET | ORAL | 0 refills | Status: DC

## 2020-06-12 MED ORDER — TICAGRELOR 90 MG PO TABS
90.0000 mg | ORAL_TABLET | Freq: Two times a day (BID) | ORAL | 0 refills | Status: DC
Start: 2020-06-12 — End: 2020-07-29

## 2020-06-12 MED ORDER — AMLODIPINE BESYLATE 10 MG PO TABS: 10.0000 mg | ORAL_TABLET | Freq: Every day | ORAL | 0 refills | Status: DC

## 2020-06-12 MED ORDER — PANTOPRAZOLE SODIUM 40 MG PO TBEC: 40.0000 mg | DELAYED_RELEASE_TABLET | Freq: Every day | ORAL | 0 refills | Status: DC

## 2020-06-12 MED ORDER — LISINOPRIL 40 MG PO TABS: ORAL_TABLET | ORAL | 0 refills | Status: DC

## 2020-06-12 NOTE — Telephone Encounter (Signed)
*  STAT* If patient is at the pharmacy, call can be transferred to refill team.   1. Which medications need to be refilled? (please list name of each medication and dose if known) lisinopril 40mg  daily, carvedilol 6.25 bid, brilinta 90mg  bid, lipitor 80 mg, amlodipine 10 mg, protonix 40 mg   (basically all heart medications) 2. Which pharmacy/location (including street and city if local pharmacy) is medication to be sent to? [facility]  In [place], [place]  3. Do they need a 30 day or 90 day supply? 4-5 days  Patient is out of town and did not bring enough medication, will be short for four days

## 2020-06-12 NOTE — Telephone Encounter (Signed)
Spoke with the patient. Patient sts that he has traveled to [place], but accidentally brought the wrong pill box with him. He will be short 4 days of his cardiac medications.  Patient sts that he has 90 day supply of his medications at home. He needs a 4 day supply of the medications listed below sent to [facility] in [place], [place]. This will carry him through until he returns home. Medications sent to [facility] in [place] as requested.  Patient verbalized understanding and voiced appreciation for the assistance.

## 2020-06-12 NOTE — Telephone Encounter (Signed)
Patient would like to be called either way to know what can be done

## 2020-06-12 NOTE — Telephone Encounter (Signed)
Patient is out of town, how many tablets should be sent to the pharmacy.

## 2020-06-12 NOTE — Telephone Encounter (Signed)
Ok to refill as requested 

## 2020-06-25 ENCOUNTER — Ambulatory Visit (INDEPENDENT_AMBULATORY_CARE_PROVIDER_SITE_OTHER): Payer: Medicare Other

## 2020-06-25 ENCOUNTER — Other Ambulatory Visit: Payer: Self-pay

## 2020-06-25 DIAGNOSIS — I341 Nonrheumatic mitral (valve) prolapse: Secondary | ICD-10-CM | POA: Diagnosis not present

## 2020-06-25 DIAGNOSIS — R011 Cardiac murmur, unspecified: Secondary | ICD-10-CM

## 2020-06-25 LAB — ECHOCARDIOGRAM COMPLETE
Area-P 1/2: 3.34 cm2
MV M vel: 3.46 m/s
MV Peak grad: 47.9 mmHg
P 1/2 time: 653 msec
S' Lateral: 3.3 cm

## 2020-07-12 ENCOUNTER — Ambulatory Visit (INDEPENDENT_AMBULATORY_CARE_PROVIDER_SITE_OTHER): Payer: Medicare Other | Admitting: Cardiovascular Disease

## 2020-07-12 ENCOUNTER — Other Ambulatory Visit: Payer: Self-pay

## 2020-07-12 ENCOUNTER — Encounter: Payer: Self-pay | Admitting: Cardiovascular Disease

## 2020-07-12 VITALS — BP 118/60 | HR 61 | Ht 67.5 in | Wt 184.0 lb

## 2020-07-12 DIAGNOSIS — I2510 Atherosclerotic heart disease of native coronary artery without angina pectoris: Secondary | ICD-10-CM | POA: Diagnosis not present

## 2020-07-12 DIAGNOSIS — I10 Essential (primary) hypertension: Secondary | ICD-10-CM | POA: Diagnosis not present

## 2020-07-12 DIAGNOSIS — I340 Nonrheumatic mitral (valve) insufficiency: Secondary | ICD-10-CM

## 2020-07-12 DIAGNOSIS — E785 Hyperlipidemia, unspecified: Secondary | ICD-10-CM

## 2020-07-12 MED ORDER — AMLODIPINE BESYLATE 5 MG PO TABS: 5.0000 mg | ORAL_TABLET | Freq: Every day | ORAL | 3 refills | Status: DC

## 2020-07-12 NOTE — Progress Notes (Signed)
Cardiology Office Note   Date:  07/12/2020   ID:  Connor Tyler, DOB [birth year], MRN [id number]  PCP:  Lynnea Ferrier, MD  Cardiologist:   Lorine Bears, MD   Chief Complaint  Patient presents with   other    6 month follow up. Meds reviewed by the pt. verbally. "doing well."      History of Present Illness: Connor Tyler is a 74 y.o. male who presents for a follow up visit regarding coronary artery disease and moderate regurgitation due to mitral valve prolapse.   He has prolonged history of mitral regurgitation and a heart murmur. He was evaluated in 2011 at the [facility] clinic. It was concluded that his mitral regurgitation was moderate and not severe and thus surgery was not needed. Cardiac catheterization showed mild three-vessel coronary artery disease at that time. Other medical problems include hypertension.  He had non-ST elevation myocardial infarction in March of this year at [facility].  Troponin was minimally elevated.  Cardiac catheterization showed severe stenosis affecting the right coronary artery and OM.  He underwent PCI and drug-eluting stent placement to both RCA and OM. Ejection fraction was normal. He has been doing very well and has been attending cardiac rehab.  He denies chest pain, shortness of breath or palpitations.  He lost 20 pounds and feels great.  He takes his medications regularly with no reported side effects.  Since the weight loss, he has noticed that his blood pressure has been lower.  Past Medical History:  Diagnosis Date   Allergic rhinitis    Anemia    Asthma    Coronary artery disease 11/11   Diffuse mild 3 vessell CAD by cardiac cath @ the [facility]   ED (erectile dysfunction)    GERD (gastroesophageal reflux disease)    Heart murmur    History of MRSA infection    Hypertension    Mitral regurgitation    Valvular heart disease     Past Surgical History:  Procedure Laterality Date   ANAL  FISSURE REPAIR     CARDIAC CATHETERIZATION     [facility]   CATARACT EXTRACTION W/PHACO Left 01/25/2018   Procedure: CATARACT EXTRACTION PHACO AND INTRAOCULAR LENS PLACEMENT (IOC);  Surgeon: Galen Manila, MD;  Location: [facility] ORS;  Service: Ophthalmology;  Laterality: Left;  [place] 00:49 AP% 13.3 CDE 6.60 Fluid pack lot # [id number]   COLONOSCOPY     COLONOSCOPY WITH PROPOFOL N/A 09/14/2018   Procedure: COLONOSCOPY WITH PROPOFOL;  Surgeon: Scot Jun, MD;  Location: [facility] ENDOSCOPY;  Service: Endoscopy;  Laterality: N/A;   ESOPHAGOGASTRODUODENOSCOPY (EGD) WITH PROPOFOL N/A 09/14/2018   Procedure: ESOPHAGOGASTRODUODENOSCOPY (EGD) WITH PROPOFOL;  Surgeon: Scot Jun, MD;  Location: [facility] ENDOSCOPY;  Service: Endoscopy;  Laterality: N/A;  IV ZOSYN   TONSILLECTOMY     wisdom teeth resected       Current Outpatient Medications  Medication Sig Dispense Refill   albuterol (PROAIR HFA) 108 (90 BASE) MCG/ACT inhaler Inhale 2 puffs into the lungs every 4 (four) hours as needed for wheezing or shortness of breath.      amLODipine (NORVASC) 10 MG tablet Take 1 tablet (10 mg total) by mouth daily. 4 tablet 0   aspirin EC 81 MG tablet Take 81 mg by mouth daily.      atorvastatin (LIPITOR) 80 MG tablet TAKE 1 TABLET EVERY DAY. 4 tablet 0   carvedilol (COREG) 6.25 MG tablet TAKE 1 TABLET TWICE DAILY. 8 tablet  0   cetirizine (ZYRTEC) 10 MG tablet Take 10 mg by mouth daily.      Coenzyme Q10 10 MG capsule Take 10 mg by mouth daily.      lisinopril (ZESTRIL) 40 MG tablet TAKE 1 TABLET ONE TIME DAILY 4 tablet 0   Melatonin 5 MG CAPS Take 10 mg by mouth at bedtime.      Multiple Vitamins-Minerals (ZINC PO) Take by mouth. Taking 1 capsule daily     nitroGLYCERIN (NITROSTAT) 0.4 MG SL tablet Place 1 tablet (0.4 mg total) under the tongue every 5 (five) minutes as needed for chest pain. 25 tablet 3   pantoprazole (PROTONIX) 40 MG tablet Take 1 tablet (40 mg total) by mouth daily.  4 tablet 0   phenylephrine-shark liver oil-mineral oil-petrolatum (PREPARATION H) 0.25-3-14-71.9 % rectal ointment Place 1 application rectally 2 (two) times daily as needed for hemorrhoids.     ticagrelor (BRILINTA) 90 MG TABS tablet Take 1 tablet (90 mg total) by mouth 2 (two) times daily. 8 tablet 0   vitamin B-12 (CYANOCOBALAMIN) 500 MCG tablet Take 500 mcg by mouth daily.     vitamin C (ASCORBIC ACID) 500 MG tablet Take 500 mg by mouth daily.     No current facility-administered medications for this visit.    Allergies:   Patient has no known allergies.    Social History:  The patient  reports that he quit smoking about 49 years ago. His smoking use included cigarettes. He has a 13.00 pack-year smoking history. He has never used smokeless tobacco. He reports current alcohol use. He reports that he does not use drugs.   Family History:  The patient's family history includes AAA (abdominal aortic aneurysm) in his father; Dementia in his mother; Heart attack in his father; Stroke in his mother.    ROS:  Please see the history of present illness.   Otherwise, review of systems are positive for none.   All other systems are reviewed and negative.    PHYSICAL EXAM: VS:  BP 118/60 (BP Location: Left Arm, Patient Position: Sitting, Cuff Size: Normal)    Pulse 61    Ht 5' 7.5" (1.715 m)    Wt 184 lb (83.5 kg)    SpO2 99%    BMI 28.39 kg/m  , BMI Body mass index is 28.39 kg/m. GEN: Well nourished, well developed, in no acute distress  HEENT: normal  Neck: no JVD, carotid bruits, or masses Cardiac: RRR; no  rubs, or gallops,no edema . There is 2/6 holosystolic murmur at the apex radiating to the axilla Respiratory:  clear to auscultation bilaterally, normal work of breathing GI: soft, nontender, nondistended, + BS MS: no deformity or atrophy  Skin: warm and dry, no rash Neuro:  Strength and sensation are intact Psych: euthymic mood, full affect   EKG:  EKG is ordered today. The ekg  ordered today demonstrates normal sinus rhythm with nonspecific T wave changes.   Recent Labs: 11/13/2019: BUN 18; Creatinine, Ser 0.91; Hemoglobin 14.0; Platelets 215; Potassium 4.4; Sodium 143 12/28/2019: ALT 28    Lipid Panel    Component Value Date/Time   CHOL 89 (L) 12/28/2019 1223   TRIG 38 12/28/2019 1223   HDL 41 12/28/2019 1223   CHOLHDL 2.2 12/28/2019 1223   LDLCALC 37 12/28/2019 1223      Wt Readings from Last 3 Encounters:  07/12/20 184 lb (83.5 kg)  12/28/19 190 lb 4 oz (86.3 kg)  11/27/19 197 lb 4 oz (89.5 kg)  ASSESSMENT AND PLAN:  1.   Coronary artery disease involving native coronary arteries without angina: He is doing extremely well after his small non-ST elevation myocardial infarction in March with subsequent stenting of the RCA and OM.  We discussed the option of continuing dual antiplatelet therapy beyond 1 year and he is agreeable.  We could consider decreasing the dose of Brilinta 60 mg twice daily or switching to Plavix instead.  2. Mitral regurgitation due to mitral valve prolapse: He had echocardiogram done last month which showed normal LV systolic function with mild to moderate mitral regurgitation.  3. Essential hypertension: Blood pressure has been somewhat on the low side.  I elected to decrease amlodipine to 5 mg daily.  4. Hyperlipidemia: Continue high-dose atorvastatin.  Most recent lipid profile in June of this year showed an LDL of 48 and triglyceride of 38.    Disposition:   FU with me in 6 months  Signed,  Lorine Bears, MD  07/12/2020 10:39 AM    [facility]

## 2020-07-12 NOTE — Patient Instructions (Addendum)
Medication Instructions:  - Your physician has recommended you make the following change in your medication:   1) DECREASE amlodipine to 5 mg- take 1 tablet by mouth once daily   *If you need a refill on your cardiac medications before your next appointment, please call your pharmacy*   Lab Work: - none ordered  If you have labs (blood work) drawn today and your tests are completely normal, you will receive your results only by: Marland Kitchen MyChart Message (if you have MyChart) OR . A paper copy in the mail If you have any lab test that is abnormal or we need to change your treatment, we will call you to review the results.   Testing/Procedures: - none ordered   Follow-Up: At [facility], you and your health needs are our priority.  As part of our continuing mission to provide you with exceptional heart care, we have created designated Provider Care Teams.  These Care Teams include your primary Cardiologist (physician) and Advanced Practice Providers (APPs -  Physician Assistants and Nurse Practitioners) who all work together to provide you with the care you need, when you need it.  We recommend signing up for the patient portal called "MyChart".  Sign up information is provided on this After Visit Summary.  MyChart is used to connect with patients for Virtual Visits (Telemedicine).  Patients are able to view lab/test results, encounter notes, upcoming appointments, etc.  Non-urgent messages can be sent to your provider as well.   To learn more about what you can do with MyChart, go to [email or web address].    Your next appointment:   6 month(s)  The format for your next appointment:   In Person  Provider:   You may see Lorine Bears, MD or one of the following Advanced Practice Providers on your designated Care Team:    Nicolasa Ducking, NP  Eula Listen, PA-C  Marisue Ivan, PA-C  Cadence Fransico [place], [place]    Other Instructions  n/a

## 2020-07-13 DIAGNOSIS — G629 Polyneuropathy, unspecified: Secondary | ICD-10-CM | POA: Insufficient documentation

## 2020-07-29 ENCOUNTER — Other Ambulatory Visit: Payer: Self-pay | Admitting: Cardiovascular Disease

## 2020-09-13 ENCOUNTER — Telehealth: Payer: Self-pay | Admitting: Cardiovascular Disease

## 2020-09-13 NOTE — Telephone Encounter (Signed)
   [facility] Pre-operative Risk Assessment    HEARTCARE STAFF: - Please ensure there is not already an duplicate clearance open for this procedure. - Under Visit Info/Reason for Call, type in Other and utilize the format Clearance MM/DD/YY or Clearance TBD. Do not use dashes or single digits. - If request is for dental extraction, please clarify the # of teeth to be extracted.  Request for surgical clearance:  1. What type of surgery is being performed? CATARACT EXTRACTION WITH IMPLANT RIGHT EYE   2. When is this surgery scheduled? 11/12/20  3. What type of clearance is required (medical clearance vs. Pharmacy clearance to hold med vs. Both)? NOT LISTED  4. Are there any medications that need to be held prior to surgery and how long? NOT LISTED  5. Practice name and name of physician performing surgery? [facility]  6. What is the office phone number? [phone]   7.   What is the office fax number? [phone] 8.   Anesthesia type (None, local, MAC, general) ? NOT LISTED   Elissa Hefty 09/13/2020, 9:31 AM  _________________________________________________________________   (provider comments below)

## 2020-09-13 NOTE — Telephone Encounter (Signed)
   Primary Cardiologist: Lorine Bears, MD  Chart reviewed as part of pre-operative protocol coverage. Cataract extractions are recognized in guidelines as low risk surgeries that do not typically require specific preoperative testing or holding of blood thinner therapy. Therefore, given past medical history and time since last visit, based on ACC/AHA guidelines, KOBEY SIDES would be at acceptable risk for the planned procedure without further cardiovascular testing.   I will route this recommendation to the requesting party via Epic fax function and remove from pre-op pool.  Please call with questions.  Beatriz Stallion, PA-C 09/13/2020, 11:17 AM

## 2020-10-22 ENCOUNTER — Telehealth: Payer: Self-pay | Admitting: Cardiovascular Disease

## 2020-10-22 NOTE — Telephone Encounter (Signed)
Pt c/o medication issue:  1. Name of Medication: brilinta   2. How are you currently taking this medication (dosage and times per day)? 90 mg am and pm  3. Are you having a reaction (difficulty breathing--STAT)?   4. What is your medication issue? Wondering how much longer patient will have to take medication. Stated last year when he had his heart attack and was told he would need to take medication for 1 year and that time is coming up in march   Please advise

## 2020-10-22 NOTE — Telephone Encounter (Signed)
Will route to Dr. Kirke Corin to advise on the continuation of Brilinta.

## 2020-10-23 NOTE — Telephone Encounter (Signed)
Brilinta can be stopped in March.

## 2020-10-24 NOTE — Telephone Encounter (Signed)
Spoke with the patient. Adv him per Dr. Kirke Corin his Marden Noble can be d/c on the 1 year anniversary of his MI. He will stop Brilinta on 11/09/20. Adv the patient that he is to continue his Asa 81 mg daily and his other cardiac medications. Patient verbalized understanding and voiced appreciation for the call.

## 2020-11-06 ENCOUNTER — Encounter: Payer: Self-pay | Admitting: Ophthalmology

## 2020-11-06 ENCOUNTER — Other Ambulatory Visit: Payer: Self-pay

## 2020-11-07 ENCOUNTER — Encounter: Payer: Self-pay | Admitting: Ophthalmology

## 2020-11-07 ENCOUNTER — Other Ambulatory Visit
Admission: RE | Admit: 2020-11-07 | Discharge: 2020-11-07 | Disposition: A | Discharge status: Home / Self Care | Payer: Medicare Other | Source: Ambulatory Visit | Attending: Ophthalmology | Admitting: Ophthalmology

## 2020-11-07 DIAGNOSIS — Z20822 Contact with and (suspected) exposure to covid-19: Secondary | ICD-10-CM | POA: Insufficient documentation

## 2020-11-07 DIAGNOSIS — Z01812 Encounter for preprocedural laboratory examination: Secondary | ICD-10-CM | POA: Diagnosis present

## 2020-11-07 LAB — SARS CORONAVIRUS 2 (TAT 6-24 HRS): SARS Coronavirus 2: NEGATIVE

## 2020-11-08 ENCOUNTER — Other Ambulatory Visit: Admission: RE | Admit: 2020-11-08 | Payer: Medicare Other | Source: Ambulatory Visit

## 2020-11-08 NOTE — Discharge Instructions (Signed)

## 2020-11-12 ENCOUNTER — Ambulatory Visit: Payer: Medicare Other | Admitting: Anesthesiology

## 2020-11-12 ENCOUNTER — Other Ambulatory Visit: Payer: Self-pay

## 2020-11-12 ENCOUNTER — Ambulatory Visit
Admission: RE | Admit: 2020-11-12 | Discharge: 2020-11-12 | Disposition: A | Discharge status: Home / Self Care | Payer: Medicare Other | Attending: Ophthalmology | Admitting: Ophthalmology

## 2020-11-12 ENCOUNTER — Encounter: Payer: Self-pay | Admitting: Ophthalmology

## 2020-11-12 ENCOUNTER — Encounter
Admission: RE | Disposition: A | Discharge status: Home / Self Care | Payer: Self-pay | Source: Home / Self Care | Attending: Ophthalmology

## 2020-11-12 DIAGNOSIS — Z79899 Other long term (current) drug therapy: Secondary | ICD-10-CM | POA: Insufficient documentation

## 2020-11-12 DIAGNOSIS — Z9842 Cataract extraction status, left eye: Secondary | ICD-10-CM | POA: Insufficient documentation

## 2020-11-12 DIAGNOSIS — Z823 Family history of stroke: Secondary | ICD-10-CM | POA: Diagnosis not present

## 2020-11-12 DIAGNOSIS — I10 Essential (primary) hypertension: Secondary | ICD-10-CM | POA: Insufficient documentation

## 2020-11-12 DIAGNOSIS — Z8614 Personal history of Methicillin resistant Staphylococcus aureus infection: Secondary | ICD-10-CM | POA: Diagnosis not present

## 2020-11-12 DIAGNOSIS — I252 Old myocardial infarction: Secondary | ICD-10-CM | POA: Diagnosis not present

## 2020-11-12 DIAGNOSIS — Z87891 Personal history of nicotine dependence: Secondary | ICD-10-CM | POA: Diagnosis not present

## 2020-11-12 DIAGNOSIS — Z955 Presence of coronary angioplasty implant and graft: Secondary | ICD-10-CM | POA: Insufficient documentation

## 2020-11-12 DIAGNOSIS — H2511 Age-related nuclear cataract, right eye: Secondary | ICD-10-CM | POA: Diagnosis not present

## 2020-11-12 DIAGNOSIS — Z961 Presence of intraocular lens: Secondary | ICD-10-CM | POA: Diagnosis not present

## 2020-11-12 DIAGNOSIS — Z7982 Long term (current) use of aspirin: Secondary | ICD-10-CM | POA: Diagnosis not present

## 2020-11-12 DIAGNOSIS — Z8249 Family history of ischemic heart disease and other diseases of the circulatory system: Secondary | ICD-10-CM | POA: Diagnosis not present

## 2020-11-12 DIAGNOSIS — Z820 Family history of epilepsy and other diseases of the nervous system: Secondary | ICD-10-CM | POA: Insufficient documentation

## 2020-11-12 HISTORY — PX: CATARACT EXTRACTION W/PHACO: SHX586

## 2020-11-12 SURGERY — PHACOEMULSIFICATION, CATARACT, WITH IOL INSERTION
Anesthesia: Monitor Anesthesia Care | Site: Eye | Laterality: Right

## 2020-11-12 MED ORDER — ARMC OPHTHALMIC DILATING DROPS
1.0000 "application " | OPHTHALMIC | Status: DC | PRN
  Administered 2020-11-12 (×3): 1 via OPHTHALMIC

## 2020-11-12 MED ORDER — NA CHONDROIT SULF-NA HYALURON 40-17 MG/ML IO SOLN
INTRAOCULAR | Status: DC | PRN
  Administered 2020-11-12: 1 mL via INTRAOCULAR

## 2020-11-12 MED ORDER — MOXIFLOXACIN HCL 0.5 % OP SOLN
OPHTHALMIC | Status: DC | PRN
  Administered 2020-11-12: 0.2 mL via OPHTHALMIC

## 2020-11-12 MED ORDER — TETRACAINE HCL 0.5 % OP SOLN
1.0000 [drp] | OPHTHALMIC | Status: DC | PRN
  Administered 2020-11-12 (×3): 1 [drp] via OPHTHALMIC

## 2020-11-12 MED ORDER — MIDAZOLAM HCL 2 MG/2ML IJ SOLN
INTRAMUSCULAR | Status: DC | PRN
  Administered 2020-11-12 (×2): 1 mg via INTRAVENOUS

## 2020-11-12 MED ORDER — LIDOCAINE HCL (PF) 2 % IJ SOLN
INTRAOCULAR | Status: DC | PRN
  Administered 2020-11-12: 2 mL

## 2020-11-12 MED ORDER — FENTANYL CITRATE (PF) 100 MCG/2ML IJ SOLN
INTRAMUSCULAR | Status: DC | PRN
  Administered 2020-11-12 (×2): 50 ug via INTRAVENOUS

## 2020-11-12 MED ORDER — BRIMONIDINE TARTRATE-TIMOLOL 0.2-0.5 % OP SOLN
OPHTHALMIC | Status: DC | PRN
  Administered 2020-11-12: 1 [drp] via OPHTHALMIC

## 2020-11-12 MED ORDER — EPINEPHRINE PF 1 MG/ML IJ SOLN
INTRAOCULAR | Status: DC | PRN
  Administered 2020-11-12: 79 mL via OPHTHALMIC

## 2020-11-12 SURGICAL SUPPLY — 19 items
CANNULA ANT/CHMB 27G (MISCELLANEOUS) ×2 IMPLANT
CANNULA ANT/CHMB 27GA (MISCELLANEOUS) ×4 IMPLANT
GLOVE SURG LX 8.0 MICRO (GLOVE) ×1
GLOVE SURG LX STRL 8.0 MICRO (GLOVE) ×1 IMPLANT
GLOVE SURG TRIUMPH 8.0 PF LTX (GLOVE) ×2 IMPLANT
GOWN STRL REUS W/ TWL LRG LVL3 (GOWN DISPOSABLE) ×2 IMPLANT
GOWN STRL REUS W/TWL LRG LVL3 (GOWN DISPOSABLE) ×4
LENS IOL TECNIS EYHANCE 17.5 (Intraocular Lens) ×1 IMPLANT
MARKER SKIN DUAL TIP RULER LAB (MISCELLANEOUS) ×2 IMPLANT
NDL FILTER BLUNT 18X1 1/2 (NEEDLE) ×1 IMPLANT
NEEDLE FILTER BLUNT 18X 1/2SAF (NEEDLE) ×1
NEEDLE FILTER BLUNT 18X1 1/2 (NEEDLE) ×1 IMPLANT
PACK EYE AFTER SURG (MISCELLANEOUS) ×2 IMPLANT
PACK OPTHALMIC (MISCELLANEOUS) ×2 IMPLANT
PACK PORFILIO (MISCELLANEOUS) ×2 IMPLANT
SYR 3ML LL SCALE MARK (SYRINGE) ×2 IMPLANT
SYR TB 1ML LUER SLIP (SYRINGE) ×2 IMPLANT
WATER STERILE IRR 250ML POUR (IV SOLUTION) ×2 IMPLANT
WIPE NON LINTING 3.25X3.25 (MISCELLANEOUS) ×2 IMPLANT

## 2020-11-12 NOTE — Anesthesia Preprocedure Evaluation (Addendum)
Anesthesia Evaluation    Airway Mallampati: II  TM Distance: >3 FB Neck ROM: Full    Dental no notable dental hx.    Pulmonary former smoker,    Pulmonary exam normal        Cardiovascular Exercise Tolerance: Good hypertension, Pt. on medications and Pt. on home beta blockers + CAD and + Cardiac Stents (stent x2 3/21)  Normal cardiovascular exam+ Valvular Problems/Murmurs MR   10/21 Echo  IMPRESSIONS    1. Left ventricular ejection fraction, by estimation, is 55 to 60%. The  left ventricle has normal function. Left ventricular endocardial border  not optimally defined to evaluate regional wall motion. There is mild left  ventricular hypertrophy. Left  ventricular diastolic parameters are consistent with Grade II diastolic  dysfunction (pseudonormalization).  2. Right ventricular systolic function is normal. The right ventricular  size is normal. There is normal pulmonary artery systolic pressure.  3. The mitral valve is myxomatous. Mild to moderate mitral valve  regurgitation. There is mild late systolic prolapse of posterior leaflet  of the mitral valve.  4. The aortic valve is tricuspid. Aortic valve regurgitation is mild. No  aortic stenosis is present.  5. There is borderline dilatation of the aortic root, measuring 36 mm.  6. The inferior vena cava is normal in size with <50% respiratory  variability, suggesting right atrial pressure of 8 mmHg.    Neuro/Psych    GI/Hepatic Neg liver ROS, GERD  Medicated and Controlled,  Endo/Other  negative endocrine ROS  Renal/GU negative Renal ROS     Musculoskeletal   Abdominal   Peds  Hematology negative hematology ROS (+)   Anesthesia Other Findings   Reproductive/Obstetrics                           Anesthesia Physical Anesthesia Plan  ASA: III  Anesthesia Plan: MAC   Post-op Pain Management:    Induction: Intravenous  PONV  Risk Score and Plan: 1 and TIVA, Midazolam and Treatment may vary due to age or medical condition  Airway Management Planned: Nasal Cannula and Natural Airway  Additional Equipment:   Intra-op Plan:   Post-operative Plan:   Informed Consent: I have reviewed the patients History and Physical, chart, labs and discussed the procedure including the risks, benefits and alternatives for the proposed anesthesia with the patient or authorized representative who has indicated his/her understanding and acceptance.       Plan Discussed with: CRNA  Anesthesia Plan Comments:         Anesthesia Quick Evaluation

## 2020-11-12 NOTE — Anesthesia Procedure Notes (Signed)
Procedure Name: MAC Date/Time: 11/12/2020 8:22 AM Performed by: Cameron Ali, CRNA Pre-anesthesia Checklist: Patient identified, Emergency Drugs available, Suction available, Timeout performed and Patient being monitored Patient Re-evaluated:Patient Re-evaluated prior to induction Oxygen Delivery Method: Nasal cannula Placement Confirmation: positive ETCO2

## 2020-11-12 NOTE — Transfer of Care (Signed)
Immediate Anesthesia Transfer of Care Note  Patient: Connor Tyler  Procedure(s) Performed: CATARACT EXTRACTION PHACO AND INTRAOCULAR LENS PLACEMENT (IOC) RIGHT 16.73 01:37.2 (Right Eye)  Patient Location: PACU  Anesthesia Type: MAC  Level of Consciousness: awake, alert  and patient cooperative  Airway and Oxygen Therapy: Patient Spontanous Breathing and Patient connected to supplemental oxygen  Post-op Assessment: Post-op Vital signs reviewed, Patient's Cardiovascular Status Stable, Respiratory Function Stable, Patent Airway and No signs of Nausea or vomiting  Post-op Vital Signs: Reviewed and stable  Complications: No complications documented.

## 2020-11-12 NOTE — Anesthesia Postprocedure Evaluation (Signed)
Anesthesia Post Note  Patient: Connor Tyler  Procedure(s) Performed: CATARACT EXTRACTION PHACO AND INTRAOCULAR LENS PLACEMENT (IOC) RIGHT 16.73 01:37.2 (Right Eye)     Patient location during evaluation: PACU Anesthesia Type: MAC Level of consciousness: awake and alert Pain management: pain level controlled Vital Signs Assessment: post-procedure vital signs reviewed and stable Respiratory status: spontaneous breathing Cardiovascular status: blood pressure returned to baseline Postop Assessment: no apparent nausea or vomiting, adequate PO intake and no headache Anesthetic complications: no   No complications documented.  Adele Barthel Lovel Suazo

## 2020-11-12 NOTE — H&P (Signed)
[facility]   Primary Care Physician:  Lynnea Ferrier, MD Ophthalmologist: Dr. Druscilla Brownie  Pre-Procedure History & Physical: HPI:  Connor Tyler is a 75 y.o. male here for cataract surgery.   Past Medical History:  Diagnosis Date  . Allergic rhinitis   . Anemia   . Asthma   . Coronary artery disease 11/11   Diffuse mild 3 vessell CAD by cardiac cath @ the [facility]  . ED (erectile dysfunction)   . GERD (gastroesophageal reflux disease)   . Heart murmur   . History of MRSA infection   . Hypertension   . Mitral regurgitation    Due to mitral valve prolapse  . Myocardial infarction (HCC) 11/10/2019   DES to RCA and OM    Past Surgical History:  Procedure Laterality Date  . ANAL FISSURE REPAIR    . CARDIAC CATHETERIZATION     [facility]  . CATARACT EXTRACTION W/PHACO Left 01/25/2018   Procedure: CATARACT EXTRACTION PHACO AND INTRAOCULAR LENS PLACEMENT (IOC);  Surgeon: Galen Manila, MD;  Location: [facility] ORS;  Service: Ophthalmology;  Laterality: Left;  [place] 00:49 AP% 13.3 CDE 6.60 Fluid pack lot # [id number]  . COLONOSCOPY    . COLONOSCOPY WITH PROPOFOL N/A 09/14/2018   Procedure: COLONOSCOPY WITH PROPOFOL;  Surgeon: Scot Jun, MD;  Location: [facility] ENDOSCOPY;  Service: Endoscopy;  Laterality: N/A;  . ESOPHAGOGASTRODUODENOSCOPY (EGD) WITH PROPOFOL N/A 09/14/2018   Procedure: ESOPHAGOGASTRODUODENOSCOPY (EGD) WITH PROPOFOL;  Surgeon: Scot Jun, MD;  Location: [facility] ENDOSCOPY;  Service: Endoscopy;  Laterality: N/A;  IV ZOSYN  . TONSILLECTOMY    . wisdom teeth resected      Prior to Admission medications   Medication Sig Start Date End Date Taking? Authorizing Provider  albuterol (VENTOLIN HFA) 108 (90 Base) MCG/ACT inhaler Inhale 2 puffs into the lungs every 4 (four) hours as needed for wheezing or shortness of breath.    Yes [provider]  amLODipine (NORVASC) 5 MG tablet Take 1 tablet (5 mg total) by mouth daily. 07/12/20 10/10/20  Yes Iran Ouch, MD  aspirin EC 81 MG tablet Take 81 mg by mouth daily.    Yes [provider]  atorvastatin (LIPITOR) 80 MG tablet TAKE 1 TABLET EVERY DAY (STOP LOVASTATIN) 07/29/20  Yes Iran Ouch, MD  carvedilol (COREG) 6.25 MG tablet TAKE 1 TABLET TWICE DAILY (STOP BISOPROLOL) 07/29/20  Yes Iran Ouch, MD  cetirizine (ZYRTEC) 10 MG tablet Take 10 mg by mouth daily.    Yes [provider]  Coenzyme Q10 10 MG capsule Take 10 mg by mouth daily.    Yes [provider]  losartan (COZAAR) 100 MG tablet Take 100 mg by mouth daily.   Yes [provider]  Melatonin 5 MG CAPS Take 10 mg by mouth at bedtime.   Yes [provider]  Multiple Vitamins-Minerals (ZINC PO) Take by mouth. Taking 1 capsule daily   Yes [provider]  nitroGLYCERIN (NITROSTAT) 0.4 MG SL tablet Place 1 tablet (0.4 mg total) under the tongue every 5 (five) minutes as needed for chest pain. 11/13/19 07/12/20 Yes Visser, Jacquelyn D, PA-C  pantoprazole (PROTONIX) 40 MG tablet TAKE 1 TABLET EVERY DAY 07/29/20  Yes Iran Ouch, MD  vitamin B-12 (CYANOCOBALAMIN) 500 MCG tablet Take 500 mcg by mouth daily.   Yes [provider]  vitamin C (ASCORBIC ACID) 500 MG tablet Take 500 mg by mouth daily.   Yes [provider]  Marden Noble  90 MG TABS tablet TAKE 1 TABLET TWICE DAILY Patient not taking: Reported on 11/12/2020 07/29/20   Iran Ouch, MD  phenylephrine-shark liver oil-mineral oil-petrolatum (PREPARATION H) 0.25-3-14-71.9 % rectal ointment Place 1 application rectally 2 (two) times daily as needed for hemorrhoids.    [provider]    Allergies as of 09/13/2020  . (No Known Allergies)    Family History  Problem Relation Age of Onset  . Stroke Mother   . Dementia Mother   . AAA (abdominal aortic aneurysm) Father   . Heart attack Father     Social History   Socioeconomic History  . Marital status: Married    Spouse  name: Not on file  . Number of children: Not on file  . Years of education: Not on file  . Highest education level: Not on file  Occupational History  . Not on file  Tobacco Use  . Smoking status: Former Smoker    Packs/day: 1.00    Years: 13.00    Pack years: 13.00    Types: Cigarettes    Quit date: 1972    Years since quitting: 50.2  . Smokeless tobacco: Never Used  Vaping Use  . Vaping Use: Never used  Substance and Sexual Activity  . Alcohol use: Yes    Alcohol/week: 7.0 standard drinks    Types: 7 Standard drinks or equivalent per week    Comment: occassionally   . Drug use: No  . Sexual activity: Not on file  Other Topics Concern  . Not on file  Social History Narrative  . Not on file   Social Determinants of Health   Financial Resource Strain: Not on file  Food Insecurity: Not on file  Transportation Needs: Not on file  Physical Activity: Not on file  Stress: Not on file  Social Connections: Not on file  Intimate Partner Violence: Not on file    Review of Systems: See HPI, otherwise negative ROS  Physical Exam: BP (!) 148/78   Pulse 68   Temp (!) 97.3 F (36.3 C) (Temporal)   Ht 5' 7.7" (1.72 m)   Wt 81.6 kg   SpO2 97%   BMI 27.61 kg/m  General:   Alert,  pleasant and cooperative in NAD Head:  Normocephalic and atraumatic. Respiratory:  Normal work of breathing.  Impression/Plan: Connor Tyler is here for cataract surgery.  Risks, benefits, limitations, and alternatives regarding cataract surgery have been reviewed with the patient.  Questions have been answered.  All parties agreeable.   Galen Manila, MD  11/12/2020, 8:16 AM

## 2020-11-12 NOTE — Op Note (Signed)
PREOPERATIVE DIAGNOSIS:  Nuclear sclerotic cataract of the right eye.   POSTOPERATIVE DIAGNOSIS:  Cataract   OPERATIVE PROCEDURE:@   SURGEON:  Galen Manila, MD.   ANESTHESIA:  Anesthesiologist: Page, Wille Celeste, MD CRNA: Maree Krabbe, CRNA  1.      Managed anesthesia care. 2.      0.4ml of Shugarcaine was instilled in the eye following the paracentesis.   COMPLICATIONS:  None.   TECHNIQUE:   Stop and chop   DESCRIPTION OF PROCEDURE:  The patient was examined and consented in the preoperative holding area where the aforementioned topical anesthesia was applied to the right eye and then brought back to the Operating Room where the right eye was prepped and draped in the usual sterile ophthalmic fashion and a lid speculum was placed. A paracentesis was created with the side port blade and the anterior chamber was filled with viscoelastic. A near clear corneal incision was performed with the steel keratome. A continuous curvilinear capsulorrhexis was performed with a cystotome followed by the capsulorrhexis forceps. Hydrodissection and hydrodelineation were carried out with BSS on a blunt cannula. The lens was removed in a stop and chop  technique and the remaining cortical material was removed with the irrigation-aspiration handpiece. The capsular bag was inflated with viscoelastic and the Technis ZCB00  lens was placed in the capsular bag without complication. The remaining viscoelastic was removed from the eye with the irrigation-aspiration handpiece. The wounds were hydrated. The anterior chamber was flushed with BSS and the eye was inflated to physiologic pressure. 0.83ml of Vigamox was placed in the anterior chamber. The wounds were found to be water tight. The eye was dressed with Combigan. The patient was given protective glasses to wear throughout the day and a shield with which to sleep tonight. The patient was also given drops with which to begin a drop regimen today and will follow-up  with me in one day. Implant Name Type Inv. Item Serial No. Manufacturer Lot No. LRB No. Used Action  LENS IOL TECNIS EYHANCE 17.5 - [id number] Intraocular Lens LENS IOL TECNIS EYHANCE 17.5 [id number] JOHNSON   Right 1 Implanted   Procedure(s): CATARACT EXTRACTION PHACO AND INTRAOCULAR LENS PLACEMENT (IOC) RIGHT 16.73 01:37.2 (Right)  Electronically signed: Galen Manila 11/12/2020 8:42 AM

## 2020-11-13 ENCOUNTER — Encounter: Payer: Self-pay | Admitting: Ophthalmology

## 2020-12-30 ENCOUNTER — Other Ambulatory Visit: Payer: Self-pay | Admitting: Cardiovascular Disease

## 2021-01-09 ENCOUNTER — Other Ambulatory Visit: Payer: Self-pay

## 2021-01-09 ENCOUNTER — Ambulatory Visit (INDEPENDENT_AMBULATORY_CARE_PROVIDER_SITE_OTHER): Payer: Medicare Other | Admitting: Cardiovascular Disease

## 2021-01-09 ENCOUNTER — Encounter: Payer: Self-pay | Admitting: Cardiovascular Disease

## 2021-01-09 VITALS — BP 130/64 | HR 67 | Ht 68.0 in | Wt 185.4 lb

## 2021-01-09 DIAGNOSIS — I10 Essential (primary) hypertension: Secondary | ICD-10-CM

## 2021-01-09 DIAGNOSIS — E785 Hyperlipidemia, unspecified: Secondary | ICD-10-CM | POA: Diagnosis not present

## 2021-01-09 DIAGNOSIS — I340 Nonrheumatic mitral (valve) insufficiency: Secondary | ICD-10-CM

## 2021-01-09 DIAGNOSIS — I2510 Atherosclerotic heart disease of native coronary artery without angina pectoris: Secondary | ICD-10-CM

## 2021-01-09 NOTE — Progress Notes (Signed)
Cardiology Office Note   Date:  01/09/2021   ID:  Connor Tyler, DOB September 25, 1945, MRN [id number]  PCP:  Lynnea Ferrier, MD  Cardiologist:   Lorine Bears, MD   Chief Complaint  Patient presents with  . Follow-up    6 month f/u no complaints today. Meds reviewed verbally with pt.      History of Present Illness: Connor Tyler is a 75 y.o. male who presents for a follow up visit regarding coronary artery disease and moderate regurgitation due to mitral valve prolapse.   He has prolonged history of mitral regurgitation and a heart murmur. He was evaluated in 2011 at the [facility] clinic. It was concluded that his mitral regurgitation was moderate and not severe and thus surgery was not needed. Cardiac catheterization showed mild three-vessel coronary artery disease at that time. Other medical problems include hypertension.  He had non-ST elevation myocardial infarction in March of 2021 at [facility].  Troponin was minimally elevated.  Cardiac catheterization showed severe stenosis affecting the right coronary artery and OM.  He underwent PCI and drug-eluting stent placement to both RCA and OM. Ejection fraction was normal.  He has been doing well with no chest pain, shortness of breath or palpitations.  He takes his medications regularly. Most recent echocardiogram in October showed normal LV systolic function, mild to moderate mitral regurgitation, mild aortic regurgitation and no evidence of pulmonary hypertension.  Past Medical History:  Diagnosis Date  . Allergic rhinitis   . Anemia   . Asthma   . Coronary artery disease 11/11   Diffuse mild 3 vessell CAD by cardiac cath @ the [facility]  . ED (erectile dysfunction)   . GERD (gastroesophageal reflux disease)   . Heart murmur   . History of MRSA infection   . Hypertension   . Mitral regurgitation    Due to mitral valve prolapse  . Myocardial infarction (HCC) 11/10/2019   DES to RCA and OM    Past  Surgical History:  Procedure Laterality Date  . ANAL FISSURE REPAIR    . CARDIAC CATHETERIZATION     [facility]  . CATARACT EXTRACTION W/PHACO Left 01/25/2018   Procedure: CATARACT EXTRACTION PHACO AND INTRAOCULAR LENS PLACEMENT (IOC);  Surgeon: Galen Manila, MD;  Location: [facility] ORS;  Service: Ophthalmology;  Laterality: Left;  [place] 00:49 AP% 13.3 CDE 6.60 Fluid pack lot # [id number]  . CATARACT EXTRACTION W/PHACO Right 11/12/2020   Procedure: CATARACT EXTRACTION PHACO AND INTRAOCULAR LENS PLACEMENT (IOC) RIGHT 16.73 01:37.2;  Surgeon: Galen Manila, MD;  Location: [facility];  Service: Ophthalmology;  Laterality: Right;  . CATARACT EXTRACTION W/PHACO Right   . COLONOSCOPY    . COLONOSCOPY WITH PROPOFOL N/A 09/14/2018   Procedure: COLONOSCOPY WITH PROPOFOL;  Surgeon: Scot Jun, MD;  Location: [facility] ENDOSCOPY;  Service: Endoscopy;  Laterality: N/A;  . ESOPHAGOGASTRODUODENOSCOPY (EGD) WITH PROPOFOL N/A 09/14/2018   Procedure: ESOPHAGOGASTRODUODENOSCOPY (EGD) WITH PROPOFOL;  Surgeon: Scot Jun, MD;  Location: [facility] ENDOSCOPY;  Service: Endoscopy;  Laterality: N/A;  IV ZOSYN  . TONSILLECTOMY    . wisdom teeth resected       Current Outpatient Medications  Medication Sig Dispense Refill  . albuterol (VENTOLIN HFA) 108 (90 Base) MCG/ACT inhaler Inhale 2 puffs into the lungs every 4 (four) hours as needed for wheezing or shortness of breath.     Marland Kitchen amLODipine (NORVASC) 5 MG tablet Take 1 tablet (5 mg total) by mouth daily. 90 tablet 3  .  aspirin EC 81 MG tablet Take 81 mg by mouth daily.     Marland Kitchen atorvastatin (LIPITOR) 80 MG tablet TAKE 1 TABLET EVERY DAY 90 tablet 0  . carvedilol (COREG) 6.25 MG tablet TAKE 1 TABLET TWICE DAILY 180 tablet 0  . cetirizine (ZYRTEC) 10 MG tablet Take 10 mg by mouth daily.     . Coenzyme Q10 10 MG capsule Take 10 mg by mouth daily.     Marland Kitchen losartan (COZAAR) 100 MG tablet Take 100 mg by mouth daily.    . Melatonin 5 MG CAPS Take 10 mg by  mouth at bedtime.    . Multiple Vitamins-Minerals (ZINC PO) Take by mouth. Taking 1 capsule daily    . nitroGLYCERIN (NITROSTAT) 0.4 MG SL tablet Place 1 tablet (0.4 mg total) under the tongue every 5 (five) minutes as needed for chest pain. 25 tablet 3  . pantoprazole (PROTONIX) 40 MG tablet TAKE 1 TABLET EVERY DAY 90 tablet 0  . phenylephrine-shark liver oil-mineral oil-petrolatum (PREPARATION H) 0.25-3-14-71.9 % rectal ointment Place 1 application rectally 2 (two) times daily as needed for hemorrhoids.    . vitamin B-12 (CYANOCOBALAMIN) 500 MCG tablet Take 500 mcg by mouth daily.    . vitamin C (ASCORBIC ACID) 500 MG tablet Take 500 mg by mouth daily.     No current facility-administered medications for this visit.    Allergies:   Lisinopril    Social History:  The patient  reports that he quit smoking about 50 years ago. His smoking use included cigarettes. He has a 13.00 pack-year smoking history. He has never used smokeless tobacco. He reports current alcohol use of about 7.0 standard drinks of alcohol per week. He reports that he does not use drugs.   Family History:  The patient's family history includes AAA (abdominal aortic aneurysm) in his father; Dementia in his mother; Heart attack in his father; Stroke in his mother.    ROS:  Please see the history of present illness.   Otherwise, review of systems are positive for none.   All other systems are reviewed and negative.    PHYSICAL EXAM: VS:  BP 130/64 (BP Location: Left Arm, Patient Position: Sitting, Cuff Size: Normal)   Pulse 67   Ht 5\' 8"  (1.727 m)   Wt 185 lb 6 oz (84.1 kg)   SpO2 98%   BMI 28.19 kg/m  , BMI Body mass index is 28.19 kg/m. GEN: Well nourished, well developed, in no acute distress  HEENT: normal  Neck: no JVD, carotid bruits, or masses Cardiac: RRR; no  rubs, or gallops,no edema . There is 2/6 holosystolic murmur at the apex radiating to the axilla Respiratory:  clear to auscultation bilaterally,  normal work of breathing GI: soft, nontender, nondistended, + BS MS: no deformity or atrophy  Skin: warm and dry, no rash Neuro:  Strength and sensation are intact Psych: euthymic mood, full affect   EKG:  EKG is ordered today. The ekg ordered today demonstrates normal sinus rhythm with nonspecific T wave changes.   Recent Labs: No results found for requested labs within last 8760 hours.    Lipid Panel    Component Value Date/Time   CHOL 89 (L) 12/28/2019 1223   TRIG 38 12/28/2019 1223   HDL 41 12/28/2019 1223   CHOLHDL 2.2 12/28/2019 1223   LDLCALC 37 12/28/2019 1223      Wt Readings from Last 3 Encounters:  01/09/21 185 lb 6 oz (84.1 kg)  11/12/20 180 lb (81.6  kg)  07/12/20 184 lb (83.5 kg)        ASSESSMENT AND PLAN:  1.   Coronary artery disease involving native coronary arteries without angina: He is doing extremely well with no anginal symptoms.  Continue medical therapy.  Continue aspirin indefinitely.   2. Mitral regurgitation due to mitral valve prolapse: Most recent echocardiogram showed mild to moderate regurgitation.  3. Essential hypertension: Blood pressures controlled on current medications.  4. Hyperlipidemia: Continue high-dose atorvastatin.  I reviewed most recent labs done in December which showed an LDL of 49 and triglyceride of 40.   Disposition:   FU with me in 12 months  Signed,  Lorine Bears, MD  01/09/2021 11:23 AM    [facility]

## 2021-01-09 NOTE — Patient Instructions (Signed)

## 2021-04-02 ENCOUNTER — Other Ambulatory Visit: Payer: Self-pay | Admitting: Internal Medicine

## 2021-04-02 DIAGNOSIS — R7989 Other specified abnormal findings of blood chemistry: Secondary | ICD-10-CM

## 2021-04-16 ENCOUNTER — Ambulatory Visit: Payer: Medicare Other

## 2021-05-01 ENCOUNTER — Ambulatory Visit
Admission: RE | Admit: 2021-05-01 | Discharge: 2021-05-01 | Disposition: A | Discharge status: Home / Self Care | Payer: Medicare Other | Source: Ambulatory Visit | Attending: Internal Medicine | Admitting: Internal Medicine

## 2021-05-01 ENCOUNTER — Other Ambulatory Visit: Payer: Self-pay

## 2021-05-01 DIAGNOSIS — R7989 Other specified abnormal findings of blood chemistry: Secondary | ICD-10-CM | POA: Insufficient documentation

## 2021-05-09 ENCOUNTER — Other Ambulatory Visit: Payer: Self-pay

## 2021-05-09 MED ORDER — ATORVASTATIN CALCIUM 40 MG PO TABS: 40.0000 mg | ORAL_TABLET | Freq: Every day | ORAL | 1 refills | Status: DC

## 2021-05-26 ENCOUNTER — Other Ambulatory Visit: Payer: Self-pay

## 2021-05-26 MED ORDER — CARVEDILOL 6.25 MG PO TABS: ORAL_TABLET | ORAL | 1 refills | Status: DC

## 2021-05-29 ENCOUNTER — Other Ambulatory Visit: Payer: Self-pay

## 2021-05-29 MED ORDER — CARVEDILOL 6.25 MG PO TABS: ORAL_TABLET | ORAL | 1 refills | Status: DC

## 2021-05-29 MED ORDER — PANTOPRAZOLE SODIUM 40 MG PO TBEC: 40.0000 mg | DELAYED_RELEASE_TABLET | Freq: Every day | ORAL | 1 refills | Status: DC

## 2021-06-24 ENCOUNTER — Telehealth: Payer: Self-pay | Admitting: Cardiovascular Disease

## 2021-06-24 NOTE — Telephone Encounter (Signed)
   Patient Name: Connor Tyler  DOB: [birth year] MRN: [id number]  Primary Cardiologist: Lorine Bears, MD  Chart reviewed as part of pre-operative protocol coverage.   Simple (1-2 teeth) dental extractions, cleaning or crowns are considered low risk procedures per guidelines and generally do not require any specific cardiac clearance. It is also generally accepted that for simple extractions and dental cleanings, there is no need to interrupt blood thinner therapy.  SBE prophylaxis is not required for the patient from a cardiac standpoint as this patient does not have prior history of valve surgery, congenital heart issue or history of endocarditis.   I will route this recommendation to the requesting party via Epic fax function and remove from pre-op pool.  Please call with questions.  [place], [place] 06/24/2021, 12:30 PM

## 2021-06-24 NOTE — Telephone Encounter (Signed)
   [facility] Pre-operative Risk Assessment    Patient Name: Connor Tyler  DOB: 1946/03/21 MRN: [id number]  HEARTCARE STAFF:  - IMPORTANT!!!!!! Under Visit Info/Reason for Call, type in Other and utilize the format Clearance MM/DD/YY or Clearance TBD. Do not use dashes or single digits. - Please review there is not already an duplicate clearance open for this procedure. - If request is for dental extraction, please clarify the # of teeth to be extracted. - If the patient is currently at the dentist's office, call Pre-Op Callback Staff (MA/nurse) to input urgent request.  - If the patient is not currently in the dentist office, please route to the Pre-Op pool.  Request for surgical clearance:  What type of surgery is being performed? Routine cleaning and exam   When is this surgery scheduled? 06/24/21 at 10 am   What type of clearance is required (medical clearance vs. Pharmacy clearance to hold med vs. Both)? Pharmacy   Are there any medications that need to be held prior to surgery and how long? No - asking if prophylaxis meds needed   Practice name and name of physician performing surgery? [facility]   What is the office phone number? [phone]   7.   What is the office fax number? [phone]  8.   Anesthesia type (None, local, MAC, general) ? No    Clarisse Gouge 06/24/2021, 11:33 AM  _________________________________________________________________   (provider comments below)

## 2021-07-11 ENCOUNTER — Ambulatory Visit: Admission: RE | Admit: 2021-07-11 | Payer: Medicare Other | Source: Home / Self Care

## 2021-07-11 ENCOUNTER — Encounter: Admission: RE | Payer: Self-pay | Source: Home / Self Care

## 2021-07-11 SURGERY — COLONOSCOPY WITH PROPOFOL
Anesthesia: General

## 2021-10-01 IMAGING — US US ABDOMEN LIMITED
1 series · 15 of 25 positions shown · non-contrast
Comparison: CT chest 01/16/2019

CLINICAL DATA: Elevated liver function studies.

EXAM:
ULTRASOUND ABDOMEN LIMITED RIGHT UPPER QUADRANT

[Series 1: us art/ven abd pelv dop (id) · 15 of 46 slices shown]
[im 1/46]
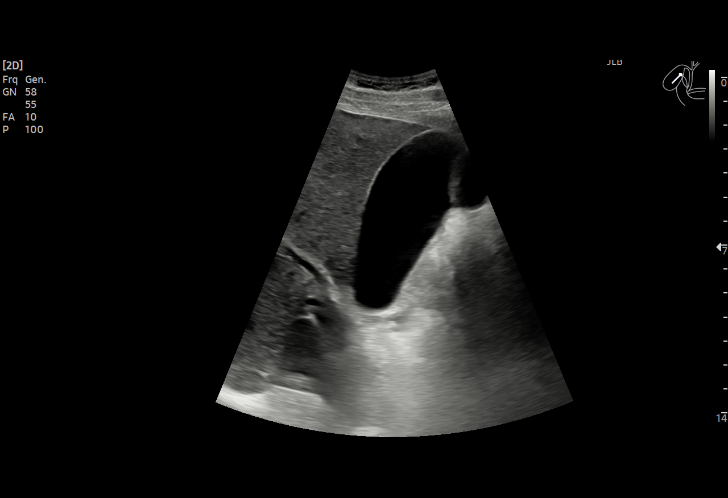
[im 4/46]
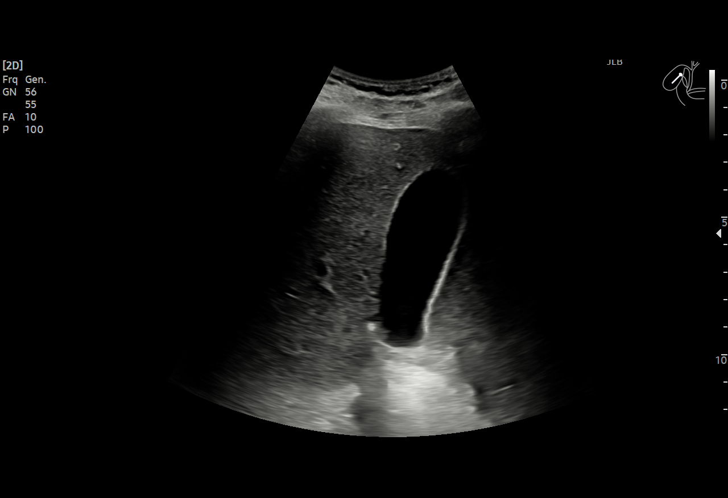
[im 8/46]
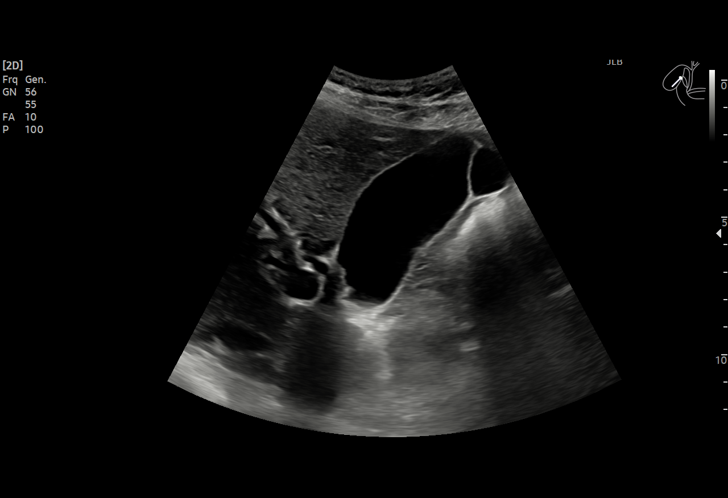
[im 10/46]
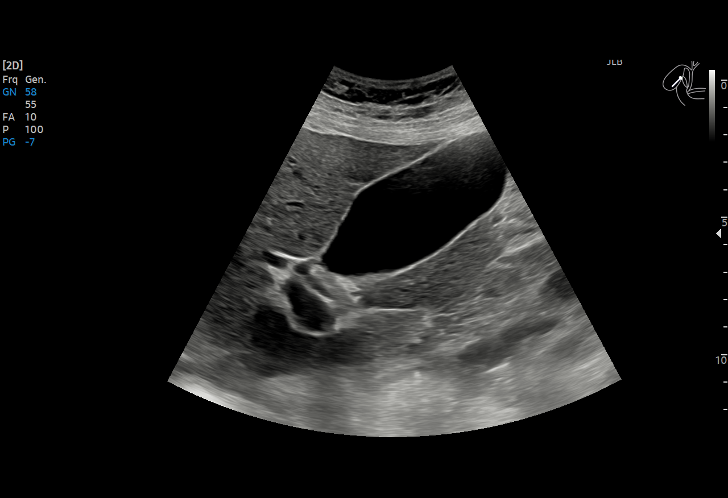
[im 14/46]
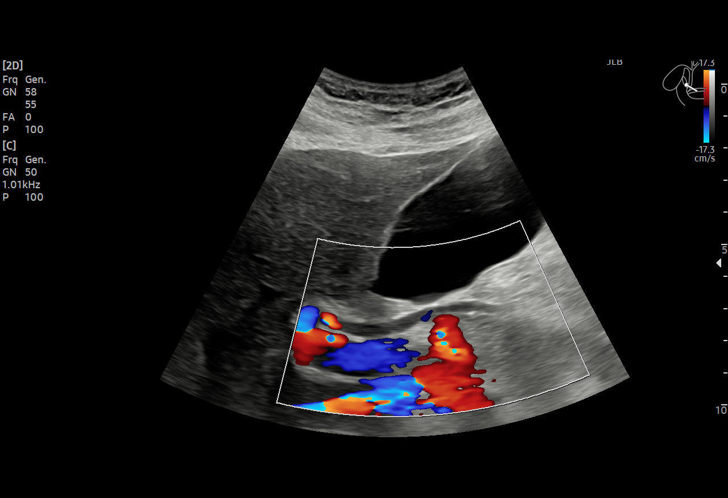
[im 17/46]
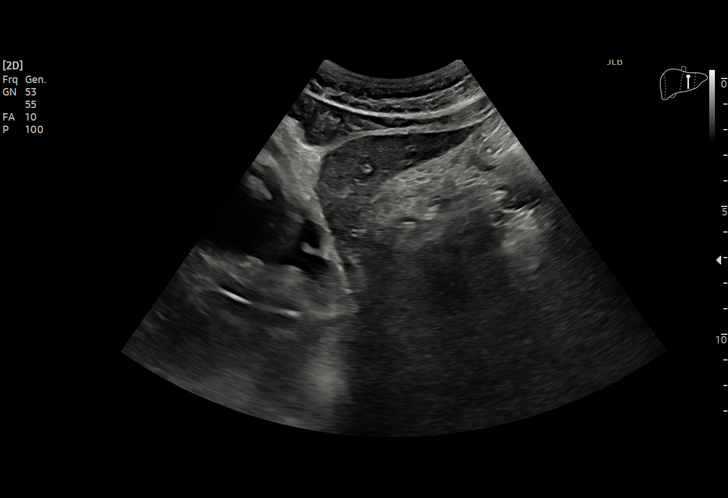
[im 19/46]
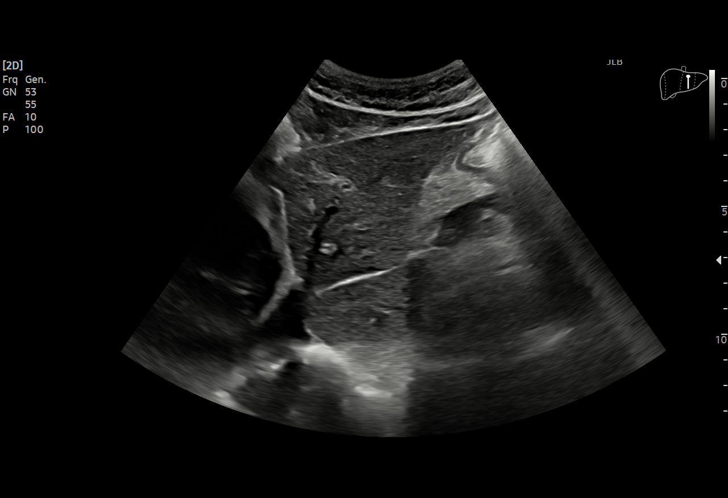
[im 23/46]
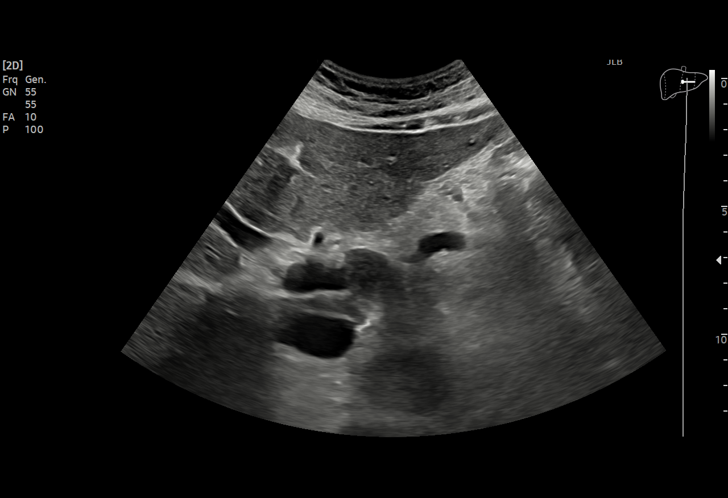
[im 27/46]
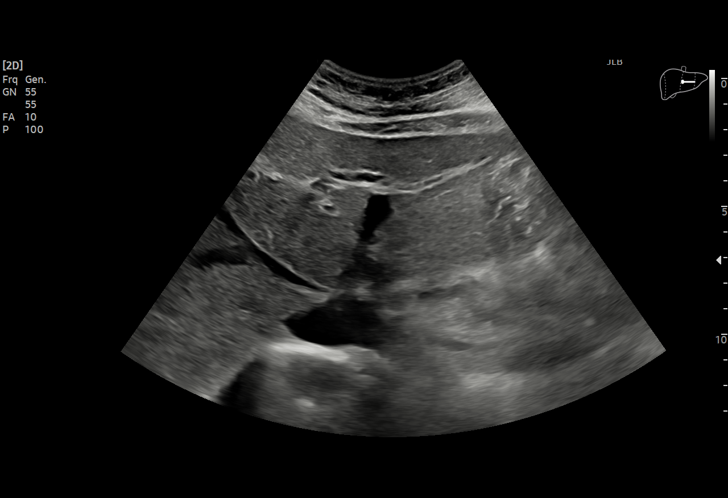
[im 29/46]
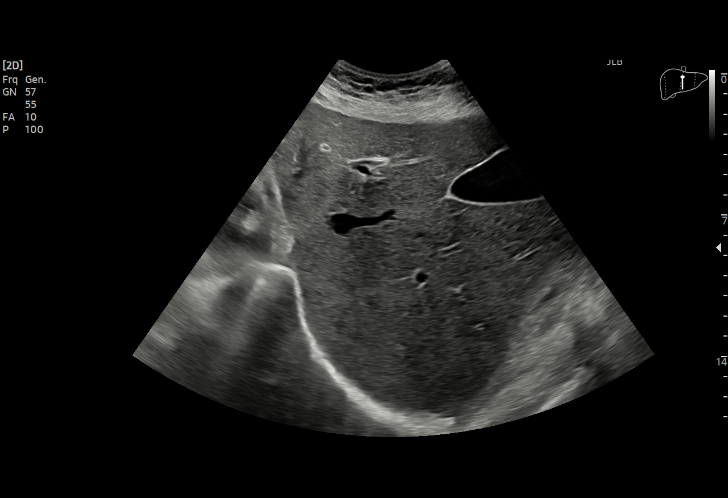
[im 32/46]
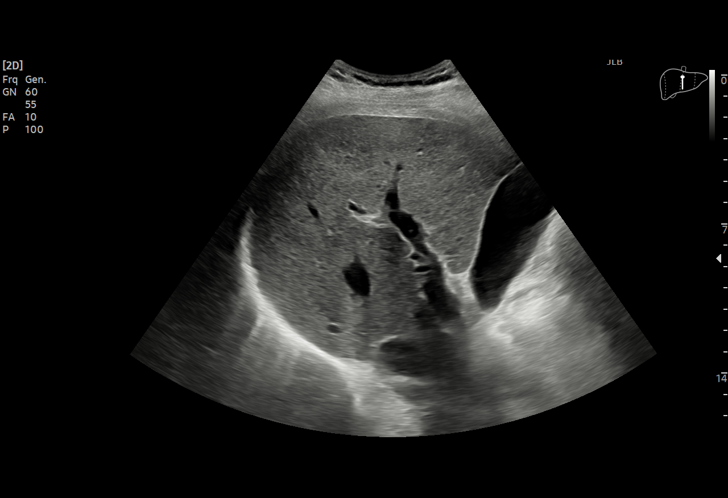
[im 36/46]
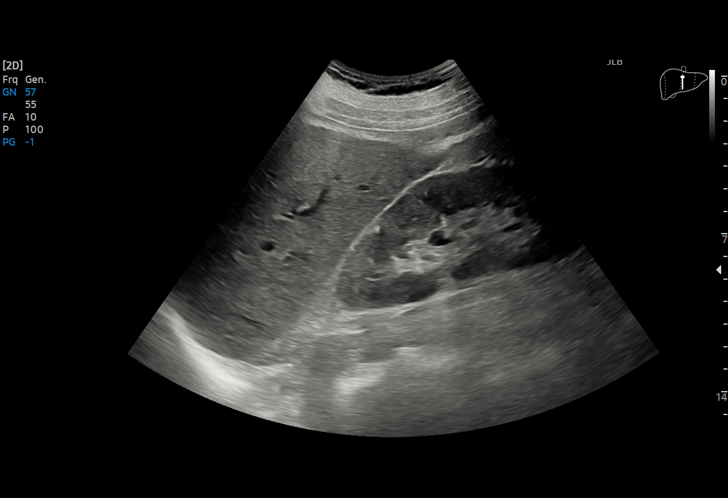
[im 38/46]
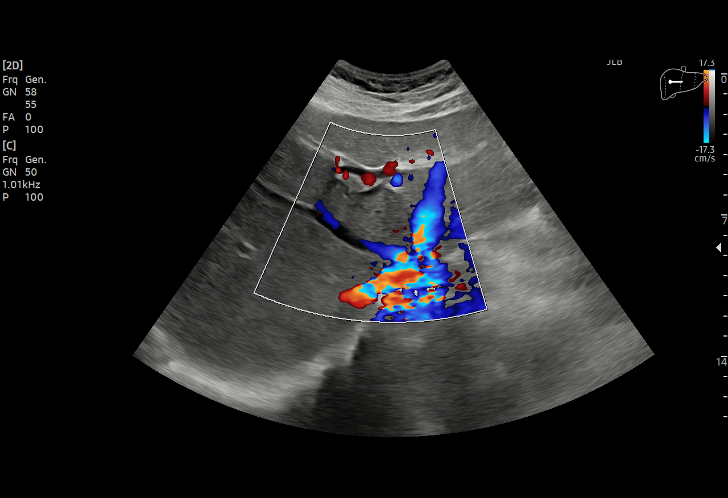
[im 42/46]
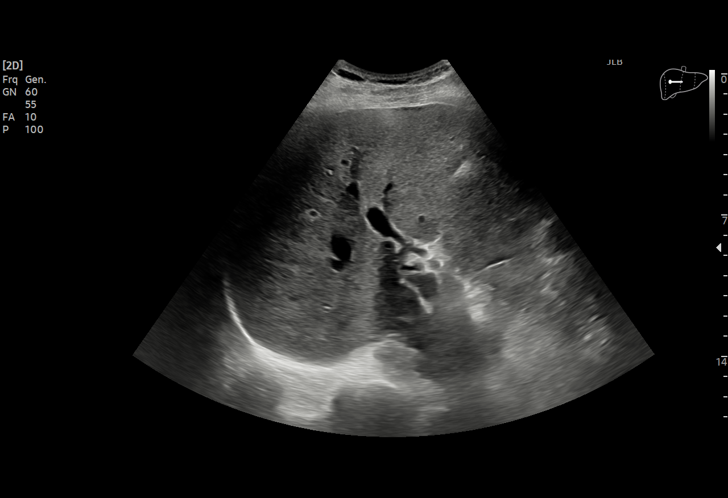
[im 46/46]
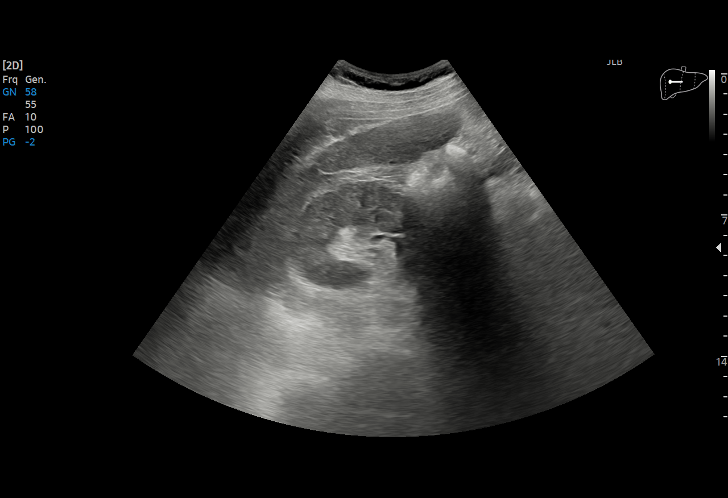

[15 of 25 positions shown; findings below may reference images not displayed]

FINDINGS: Gallbladder:

No gallstones or wall thickening visualized. No sonographic Murphy
sign noted by sonographer.

Common bile duct:

Diameter: 5 mm, normal

Liver:

No focal lesion identified. Within normal limits in parenchymal
echogenicity. Portal vein is patent on color Doppler imaging with
normal direction of blood flow towards the liver.

Other: None.
IMPRESSION: Normal examination. No evidence of cholelithiasis or acute
cholecystitis.

## 2021-10-09 ENCOUNTER — Other Ambulatory Visit: Payer: Self-pay | Admitting: Cardiovascular Disease

## 2021-10-25 ENCOUNTER — Other Ambulatory Visit: Payer: Self-pay | Admitting: Cardiovascular Disease

## 2021-10-29 ENCOUNTER — Other Ambulatory Visit: Payer: Self-pay | Admitting: Cardiovascular Disease

## 2021-12-19 ENCOUNTER — Encounter: Admission: RE | Payer: Self-pay | Source: Home / Self Care

## 2021-12-19 ENCOUNTER — Ambulatory Visit: Admission: RE | Admit: 2021-12-19 | Payer: Medicare Other | Source: Home / Self Care

## 2021-12-19 SURGERY — COLONOSCOPY WITH PROPOFOL
Anesthesia: General

## 2022-01-27 ENCOUNTER — Encounter: Payer: Self-pay | Admitting: Cardiovascular Disease

## 2022-01-28 ENCOUNTER — Telehealth: Payer: Self-pay

## 2022-01-28 NOTE — Telephone Encounter (Signed)
LVM to schedule follow up appointment

## 2022-02-04 ENCOUNTER — Telehealth: Payer: Self-pay | Admitting: Cardiovascular Disease

## 2022-02-04 NOTE — Telephone Encounter (Signed)
I called and spoke with Patty at Dr. Theodore Demark office to follow up on a possible clearance that is needed for the patient's upcoming surgery.  She advised the patient was seen by Dr. Rudene Christians on 01/21/22 and does have plans for a left carpal tunnel release surgery in the office in July.  Per Chong Sicilian, she was not certain that the patient would require a cardiac clearance as the procedure was to be done in the office, but she will follow up on this.  I have asked that any request for surgical clearance be faxed to our office at [phone].

## 2022-02-04 NOTE — Telephone Encounter (Signed)
Pt states that he is having a procedure done next month. Pt says that the office doing the procedure needs for Dr. Kirke Corin to call regarding Clearance. Please advise  Dr. Rosita Kea - L[facility] Dolly - [phone]

## 2022-03-31 ENCOUNTER — Other Ambulatory Visit: Payer: Self-pay | Admitting: Cardiovascular Disease

## 2022-04-10 ENCOUNTER — Ambulatory Visit (INDEPENDENT_AMBULATORY_CARE_PROVIDER_SITE_OTHER): Payer: Medicare Other | Admitting: Cardiovascular Disease

## 2022-04-10 ENCOUNTER — Encounter: Payer: Self-pay | Admitting: Cardiovascular Disease

## 2022-04-10 VITALS — BP 150/82 | HR 68 | Ht 68.0 in | Wt 193.0 lb

## 2022-04-10 DIAGNOSIS — I10 Essential (primary) hypertension: Secondary | ICD-10-CM | POA: Diagnosis not present

## 2022-04-10 DIAGNOSIS — I341 Nonrheumatic mitral (valve) prolapse: Secondary | ICD-10-CM | POA: Diagnosis not present

## 2022-04-10 DIAGNOSIS — I2510 Atherosclerotic heart disease of native coronary artery without angina pectoris: Secondary | ICD-10-CM

## 2022-04-10 DIAGNOSIS — E785 Hyperlipidemia, unspecified: Secondary | ICD-10-CM

## 2022-04-10 NOTE — Patient Instructions (Signed)
Medication Instructions:  Your physician recommends that you continue on your current medications as directed. Please refer to the Current Medication list given to you today.  *If you need a refill on your cardiac medications before your next appointment, please call your pharmacy*   Lab Work: None ordered If you have labs (blood work) drawn today and your tests are completely normal, you will receive your results only by: MyChart Message (if you have MyChart) OR A paper copy in the mail If you have any lab test that is abnormal or we need to change your treatment, we will call you to review the results.   Testing/Procedures: Your physician has requested that you have an echocardiogram. Echocardiography is a painless test that uses sound waves to create images of your heart. It provides your doctor with information about the size and shape of your heart and how well your heart's chambers and valves are working. This procedure takes approximately one hour. There are no restrictions for this procedure. (To be scheduled in Oct 2023)   Follow-Up: At [facility], you and your health needs are our priority.  As part of our continuing mission to provide you with exceptional heart care, we have created designated Provider Care Teams.  These Care Teams include your primary Cardiologist (physician) and Advanced Practice Providers (APPs -  Physician Assistants and Nurse Practitioners) who all work together to provide you with the care you need, when you need it.  We recommend signing up for the patient portal called "MyChart".  Sign up information is provided on this After Visit Summary.  MyChart is used to connect with patients for Virtual Visits (Telemedicine).  Patients are able to view lab/test results, encounter notes, upcoming appointments, etc.  Non-urgent messages can be sent to your provider as well.   To learn more about what you can do with MyChart, go to [email or web address].    Your  physician wants you to follow-up in: 1 year You will receive a reminder letter in the mail two months in advance. If you don't receive a letter, please call our office to schedule the follow-up appointment.   The format for your next appointment:   In Person  Provider:   You may see Lorine Bears, MD or one of the following Advanced Practice Providers on your designated Care Team:   Nicolasa Ducking, NP Eula Listen, PA-C Cadence Fransico Michael, [place]   Other Instructions N/A  Important Information About Sugar

## 2022-04-10 NOTE — Progress Notes (Signed)
Cardiology Office Note   Date:  04/10/2022   ID:  Connor Tyler, DOB 08/25/1946, MRN [id number]  PCP:  Lynnea Ferrier, MD  Cardiologist:   Lorine Bears, MD   Chief Complaint  Patient presents with   Follow-up    12 month follow up-No new Cardiac concerns      History of Present Illness: Connor Tyler is a 76 y.o. male who presents for a follow up visit regarding coronary artery disease and moderate regurgitation due to mitral valve prolapse.   He has prolonged history of mitral regurgitation and a heart murmur. He was evaluated in 2011 at the [facility] clinic. It was concluded that his mitral regurgitation was moderate and not severe and thus surgery was not needed. Cardiac catheterization showed mild three-vessel coronary artery disease at that time. Other medical problems include hypertension.  He had non-ST elevation myocardial infarction in March of 2021 at [facility].  Troponin was minimally elevated.  Cardiac catheterization showed severe stenosis affecting the right coronary artery and OM.  He underwent PCI and drug-eluting stent placement to both RCA and OM. Ejection fraction was normal.  Most recent echocardiogram in October of 2021 showed normal LV systolic function, mild to moderate mitral regurgitation, mild aortic regurgitation and no evidence of pulmonary hypertension.  He has been doing well with no chest pain, shortness of breath or palpitations.  Most recent labs showed mildly elevated liver enzymes but he does admit to increased alcohol intake.  He has been taking his medications regularly.  Past Medical History:  Diagnosis Date   Allergic rhinitis    Anemia    Asthma    Coronary artery disease 11/11   Diffuse mild 3 vessell CAD by cardiac cath @ the [facility]   ED (erectile dysfunction)    GERD (gastroesophageal reflux disease)    Heart murmur    History of MRSA infection    Hypertension    Mitral regurgitation    Due to mitral  valve prolapse   Myocardial infarction (HCC) 11/10/2019   DES to RCA and OM    Past Surgical History:  Procedure Laterality Date   ANAL FISSURE REPAIR     CARDIAC CATHETERIZATION     [facility]   CATARACT EXTRACTION W/PHACO Left 01/25/2018   Procedure: CATARACT EXTRACTION PHACO AND INTRAOCULAR LENS PLACEMENT (IOC);  Surgeon: Galen Manila, MD;  Location: [facility] ORS;  Service: Ophthalmology;  Laterality: Left;  [place] 00:49 AP% 13.3 CDE 6.60 Fluid pack lot # [id number]   CATARACT EXTRACTION W/PHACO Right 11/12/2020   Procedure: CATARACT EXTRACTION PHACO AND INTRAOCULAR LENS PLACEMENT (IOC) RIGHT 16.73 01:37.2;  Surgeon: Galen Manila, MD;  Location: [facility];  Service: Ophthalmology;  Laterality: Right;   CATARACT EXTRACTION W/PHACO Right    COLONOSCOPY     COLONOSCOPY WITH PROPOFOL N/A 09/14/2018   Procedure: COLONOSCOPY WITH PROPOFOL;  Surgeon: Scot Jun, MD;  Location: [facility] ENDOSCOPY;  Service: Endoscopy;  Laterality: N/A;   ESOPHAGOGASTRODUODENOSCOPY (EGD) WITH PROPOFOL N/A 09/14/2018   Procedure: ESOPHAGOGASTRODUODENOSCOPY (EGD) WITH PROPOFOL;  Surgeon: Scot Jun, MD;  Location: [facility] ENDOSCOPY;  Service: Endoscopy;  Laterality: N/A;  IV ZOSYN   TONSILLECTOMY     wisdom teeth resected       Current Outpatient Medications  Medication Sig Dispense Refill   albuterol (VENTOLIN HFA) 108 (90 Base) MCG/ACT inhaler Inhale 2 puffs into the lungs every 4 (four) hours as needed for wheezing or shortness of breath.  amLODipine (NORVASC) 5 MG tablet Take 1 tablet (5 mg total) by mouth daily. 90 tablet 3   aspirin EC 81 MG tablet Take 81 mg by mouth daily.      atorvastatin (LIPITOR) 40 MG tablet TAKE 1 TABLET EVERY DAY (DOSE DECREASE) 90 tablet 0   carvedilol (COREG) 6.25 MG tablet TAKE 1 TABLET TWICE DAILY 180 tablet 1   cetirizine (ZYRTEC) 10 MG tablet Take 10 mg by mouth daily.      Coenzyme Q10 10 MG capsule Take 10 mg by mouth daily.      losartan  (COZAAR) 100 MG tablet Take 100 mg by mouth daily.     nitroGLYCERIN (NITROSTAT) 0.4 MG SL tablet Place 1 tablet (0.4 mg total) under the tongue every 5 (five) minutes as needed for chest pain. 25 tablet 3   pantoprazole (PROTONIX) 40 MG tablet TAKE 1 TABLET EVERY DAY 90 tablet 1   phenylephrine-shark liver oil-mineral oil-petrolatum (PREPARATION H) 0.25-3-14-71.9 % rectal ointment Place 1 application rectally 2 (two) times daily as needed for hemorrhoids.     sildenafil (VIAGRA) 100 MG tablet Take 50-100 mg by mouth daily as needed.     vitamin B-12 (CYANOCOBALAMIN) 500 MCG tablet Take 500 mcg by mouth daily.     vitamin C (ASCORBIC ACID) 500 MG tablet Take 500 mg by mouth daily.     No current facility-administered medications for this visit.    Allergies:   Lisinopril    Social History:  The patient  reports that he quit smoking about 51 years ago. His smoking use included cigarettes. He has a 13.00 pack-year smoking history. He has never used smokeless tobacco. He reports current alcohol use of about 7.0 standard drinks of alcohol per week. He reports that he does not use drugs.   Family History:  The patient's family history includes AAA (abdominal aortic aneurysm) in his father; Dementia in his mother; Heart attack in his father; Stroke in his mother.    ROS:  Please see the history of present illness.   Otherwise, review of systems are positive for none.   All other systems are reviewed and negative.    PHYSICAL EXAM: VS:  BP (!) 150/82 (BP Location: Left Arm, Patient Position: Sitting, Cuff Size: Normal)   Pulse 68   Ht 5\' 8"  (1.727 m)   Wt 193 lb (87.5 kg)   SpO2 97%   BMI 29.35 kg/m  , BMI Body mass index is 29.35 kg/m. GEN: Well nourished, well developed, in no acute distress  HEENT: normal  Neck: no JVD, carotid bruits, or masses Cardiac: RRR; no  rubs, or gallops,no edema . There is 2/6 holosystolic murmur at the apex radiating to the axilla Respiratory:  clear to  auscultation bilaterally, normal work of breathing GI: soft, nontender, nondistended, + BS MS: no deformity or atrophy  Skin: warm and dry, no rash Neuro:  Strength and sensation are intact Psych: euthymic mood, full affect   EKG:  EKG is ordered today. The ekg ordered today demonstrates normal sinus rhythm with no significant ST or T wave changes.   Recent Labs: No results found for requested labs within last 365 days.    Lipid Panel    Component Value Date/Time   CHOL 89 (L) 12/28/2019 1223   TRIG 38 12/28/2019 1223   HDL 41 12/28/2019 1223   CHOLHDL 2.2 12/28/2019 1223   LDLCALC 37 12/28/2019 1223      Wt Readings from Last 3 Encounters:  04/10/22 193  lb (87.5 kg)  01/09/21 185 lb 6 oz (84.1 kg)  11/12/20 180 lb (81.6 kg)        ASSESSMENT AND PLAN:  1.   Coronary artery disease involving native coronary arteries without angina: He is doing extremely well with no anginal symptoms.  Continue medical therapy.  Continue aspirin indefinitely.   2. Mitral regurgitation due to mitral valve prolapse: I requested a repeat echocardiogram to be done in October to follow-up on severity of mitral regurgitation.  3. Essential hypertension: Blood pressure is mildly elevated but his blood pressure is more controlled at home.  This could be related to increased alcohol intake recently.  Monitor blood pressure for now.  4. Hyperlipidemia: I reviewed most recent labs which showed an LDL of 67.  Continue atorvastatin 40 mg daily.  5.  Abnormal liver enzymes: Most recent labs in June showed an AST of 59 and an ALT of 80.  These were previously normal.  I suspect that this is likely due to increased alcohol intake.  If the abnormalities persist, we could consider switching atorvastatin to rosuvastatin.   Disposition:   FU with me in 12 months  Signed,  Lorine Bears, MD  04/10/2022 11:46 AM    [facility]

## 2022-06-15 ENCOUNTER — Other Ambulatory Visit: Payer: Self-pay | Admitting: Cardiovascular Disease

## 2022-06-15 NOTE — Telephone Encounter (Signed)
Rx refill sent to pharmacy. 

## 2022-06-16 ENCOUNTER — Ambulatory Visit: Payer: Medicare Other | Attending: Cardiovascular Disease

## 2022-06-16 DIAGNOSIS — I341 Nonrheumatic mitral (valve) prolapse: Secondary | ICD-10-CM

## 2022-06-16 DIAGNOSIS — E785 Hyperlipidemia, unspecified: Secondary | ICD-10-CM | POA: Diagnosis not present

## 2022-06-16 DIAGNOSIS — I080 Rheumatic disorders of both mitral and aortic valves: Secondary | ICD-10-CM | POA: Insufficient documentation

## 2022-06-16 DIAGNOSIS — I2510 Atherosclerotic heart disease of native coronary artery without angina pectoris: Secondary | ICD-10-CM | POA: Insufficient documentation

## 2022-06-16 DIAGNOSIS — I10 Essential (primary) hypertension: Secondary | ICD-10-CM | POA: Insufficient documentation

## 2022-06-16 DIAGNOSIS — I252 Old myocardial infarction: Secondary | ICD-10-CM | POA: Insufficient documentation

## 2022-06-16 DIAGNOSIS — Z87891 Personal history of nicotine dependence: Secondary | ICD-10-CM | POA: Insufficient documentation

## 2022-06-16 LAB — ECHOCARDIOGRAM COMPLETE
AR max vel: 3.67 cm2
AV Area VTI: 4.77 cm2
AV Area mean vel: 3.48 cm2
AV Mean grad: 3 mmHg
AV Peak grad: 4.8 mmHg
Ao pk vel: 1.1 m/s
Area-P 1/2: 5.97 cm2
Calc EF: 54.6 %
S' Lateral: 3.6 cm
Single Plane A2C EF: 52.7 %
Single Plane A4C EF: 56.8 %

## 2022-06-22 ENCOUNTER — Telehealth: Payer: Self-pay | Admitting: Cardiovascular Disease

## 2022-06-22 NOTE — Telephone Encounter (Signed)
Patient made aware of results and verbalized understanding.  

## 2022-06-22 NOTE — Telephone Encounter (Signed)
Patient is returning call in regards to results. Transferred to Lisa, RN.  

## 2022-12-18 ENCOUNTER — Other Ambulatory Visit: Payer: Self-pay | Admitting: *Deleted

## 2022-12-18 ENCOUNTER — Other Ambulatory Visit: Payer: Self-pay

## 2022-12-18 MED ORDER — CARVEDILOL 6.25 MG PO TABS: 6.2500 mg | ORAL_TABLET | Freq: Two times a day (BID) | ORAL | 0 refills | Status: DC

## 2022-12-18 MED ORDER — PANTOPRAZOLE SODIUM 40 MG PO TBEC: 40.0000 mg | DELAYED_RELEASE_TABLET | Freq: Every day | ORAL | 0 refills | Status: DC

## 2022-12-18 MED ORDER — ATORVASTATIN CALCIUM 40 MG PO TABS: ORAL_TABLET | ORAL | 0 refills | Status: DC

## 2023-03-01 ENCOUNTER — Other Ambulatory Visit: Payer: Self-pay | Admitting: Cardiovascular Disease

## 2023-05-31 ENCOUNTER — Other Ambulatory Visit: Payer: Self-pay | Admitting: Cardiovascular Disease

## 2023-05-31 NOTE — Telephone Encounter (Signed)
Please contact pt for future appointment. Pt overdue for 12 month f/u. Pt needing refills.

## 2023-07-02 NOTE — Telephone Encounter (Signed)
Left voicemail to schedule 12 month follow up appointment with Dr. Kirke Corin or APP

## 2023-07-22 ENCOUNTER — Other Ambulatory Visit: Payer: Self-pay | Admitting: Cardiovascular Disease

## 2023-09-15 ENCOUNTER — Telehealth: Payer: Self-pay | Admitting: Cardiovascular Disease

## 2023-09-15 MED ORDER — PANTOPRAZOLE SODIUM 40 MG PO TBEC: 40.0000 mg | DELAYED_RELEASE_TABLET | Freq: Every day | ORAL | 0 refills | Status: DC

## 2023-09-15 MED ORDER — ATORVASTATIN CALCIUM 40 MG PO TABS: 40.0000 mg | ORAL_TABLET | Freq: Every day | ORAL | 0 refills | Status: DC

## 2023-09-15 NOTE — Telephone Encounter (Signed)
*  STAT* If patient is at the pharmacy, call can be transferred to refill team.   1. Which medications need to be refilled? (please list name of each medication and dose if known) atorvastatin  (LIPITOR) 40 MG tablet  pantoprazole  (PROTONIX ) 40 MG tablet  2. Which pharmacy/location (including street and city if local pharmacy) is medication to be sent to?[facility] - [place], [place] - [address]   3. Do they need a 30 day or 90 day supply? 90 day

## 2023-09-15 NOTE — Telephone Encounter (Signed)
 Requested Prescriptions   Signed Prescriptions Disp Refills   atorvastatin  (LIPITOR) 40 MG tablet 90 tablet 0    Sig: Take 1 tablet (40 mg total) by mouth daily.    Authorizing Provider: DARRON GRASS A    Ordering User: Bethania Schlotzhauer  C   pantoprazole  (PROTONIX ) 40 MG tablet 90 tablet 0    Sig: Take 1 tablet (40 mg total) by mouth daily.    Authorizing Provider: DARRON GRASS A    Ordering User: [email or web address], Latrenda Irani  C

## 2023-09-24 ENCOUNTER — Ambulatory Visit: Payer: Medicare Other | Attending: Cardiovascular Disease | Admitting: Cardiovascular Disease

## 2023-09-24 ENCOUNTER — Encounter: Payer: Self-pay | Admitting: Cardiovascular Disease

## 2023-09-24 ENCOUNTER — Ambulatory Visit: Payer: Medicare Other

## 2023-09-24 VITALS — BP 154/84 | HR 75 | Ht 68.0 in | Wt 195.6 lb

## 2023-09-24 DIAGNOSIS — I10 Essential (primary) hypertension: Secondary | ICD-10-CM | POA: Diagnosis present

## 2023-09-24 DIAGNOSIS — J45909 Unspecified asthma, uncomplicated: Secondary | ICD-10-CM | POA: Insufficient documentation

## 2023-09-24 DIAGNOSIS — E785 Hyperlipidemia, unspecified: Secondary | ICD-10-CM | POA: Diagnosis present

## 2023-09-24 DIAGNOSIS — I2510 Atherosclerotic heart disease of native coronary artery without angina pectoris: Secondary | ICD-10-CM | POA: Insufficient documentation

## 2023-09-24 DIAGNOSIS — I341 Nonrheumatic mitral (valve) prolapse: Secondary | ICD-10-CM | POA: Diagnosis present

## 2023-09-24 DIAGNOSIS — R002 Palpitations: Secondary | ICD-10-CM | POA: Insufficient documentation

## 2023-09-24 DIAGNOSIS — D649 Anemia, unspecified: Secondary | ICD-10-CM | POA: Insufficient documentation

## 2023-09-24 MED ORDER — AMLODIPINE BESYLATE 5 MG PO TABS: 5.0000 mg | ORAL_TABLET | Freq: Every day | ORAL | 3 refills | Status: DC

## 2023-09-24 MED ORDER — CARVEDILOL 6.25 MG PO TABS: 6.2500 mg | ORAL_TABLET | Freq: Two times a day (BID) | ORAL | 3 refills | Status: DC

## 2023-09-24 MED ORDER — ATORVASTATIN CALCIUM 40 MG PO TABS: 40.0000 mg | ORAL_TABLET | Freq: Every day | ORAL | 3 refills | Status: DC

## 2023-09-24 NOTE — Progress Notes (Signed)
Cardiology Office Note   Date:  09/24/2023   ID:  Connor Tyler, DOB 12-05-1945, MRN [id number]  PCP:  Connor Ferrier, MD  Cardiologist:   Connor Bears, MD   Chief Complaint  Patient presents with   Follow-up    Patient denies new or acute cardiac problems/concerns today.        History of Present Illness: Connor Tyler is a 78 y.o. male who presents for a follow up visit regarding coronary artery disease and moderate regurgitation due to mitral valve prolapse.   He has prolonged history of mitral regurgitation and a heart murmur. He was evaluated in 2011 at the [facility] clinic. It was concluded that his mitral regurgitation was moderate and not severe and thus surgery was not needed. Cardiac catheterization showed mild three-vessel coronary artery disease at that time. Other medical problems include hypertension.  He had non-ST elevation myocardial infarction in March of 2021 at [facility].  Troponin was minimally elevated.  Cardiac catheterization showed severe stenosis affecting the right coronary artery and OM.  He underwent PCI and drug-eluting stent placement to both RCA and OM. Ejection fraction was normal.  Most recent echocardiogram in October 2023 showed normal LV systolic function with mild mitral regurgitation.  He has been doing well overall with no chest pain or shortness of breath.  However, he did notice intermittent palpitations recently.  No syncope or presyncope.  Past Medical History:  Diagnosis Date   Allergic rhinitis    Anemia    Asthma    Coronary artery disease 11/11   Diffuse mild 3 vessell CAD by cardiac cath @ the [facility]   ED (erectile dysfunction)    GERD (gastroesophageal reflux disease)    Heart murmur    History of MRSA infection    Hypertension    Mitral regurgitation    Due to mitral valve prolapse   Myocardial infarction (HCC) 11/10/2019   DES to RCA and OM    Past Surgical History:  Procedure Laterality  Date   ANAL FISSURE REPAIR     CARDIAC CATHETERIZATION     [facility]   CATARACT EXTRACTION W/PHACO Left 01/25/2018   Procedure: CATARACT EXTRACTION PHACO AND INTRAOCULAR LENS PLACEMENT (IOC);  Surgeon: Galen Manila, MD;  Location: [facility] ORS;  Service: Ophthalmology;  Laterality: Left;  [place] 00:49 AP% 13.3 CDE 6.60 Fluid pack lot # [id number] H   CATARACT EXTRACTION W/PHACO Right 11/12/2020   Procedure: CATARACT EXTRACTION PHACO AND INTRAOCULAR LENS PLACEMENT (IOC) RIGHT 16.73 01:37.2;  Surgeon: Galen Manila, MD;  Location: [facility];  Service: Ophthalmology;  Laterality: Right;   CATARACT EXTRACTION W/PHACO Right    COLONOSCOPY     COLONOSCOPY WITH PROPOFOL N/A 09/14/2018   Procedure: COLONOSCOPY WITH PROPOFOL;  Surgeon: Scot Jun, MD;  Location: [facility] ENDOSCOPY;  Service: Endoscopy;  Laterality: N/A;   ESOPHAGOGASTRODUODENOSCOPY (EGD) WITH PROPOFOL N/A 09/14/2018   Procedure: ESOPHAGOGASTRODUODENOSCOPY (EGD) WITH PROPOFOL;  Surgeon: Scot Jun, MD;  Location: [facility] ENDOSCOPY;  Service: Endoscopy;  Laterality: N/A;  IV ZOSYN   TONSILLECTOMY     wisdom teeth resected       Current Outpatient Medications  Medication Sig Dispense Refill   albuterol (VENTOLIN HFA) 108 (90 Base) MCG/ACT inhaler Inhale 2 puffs into the lungs every 4 (four) hours as needed for wheezing or shortness of breath.      amLODipine (NORVASC) 5 MG tablet Take 1 tablet (5 mg total) by mouth daily. 90 tablet 3  aspirin EC 81 MG tablet Take 81 mg by mouth daily.      atorvastatin (LIPITOR) 40 MG tablet Take 1 tablet (40 mg total) by mouth daily. 90 tablet 0   carvedilol (COREG) 6.25 MG tablet Take 1 tablet (6.25 mg total) by mouth 2 (two) times daily. Overdue yearly follow up.  PLEASE CALL OFFICE TO SCHEDULE APPOINTMENT PRIOR TO NEXT REFILL (first attempt) 60 tablet 0   cetirizine (ZYRTEC) 10 MG tablet Take 10 mg by mouth daily.      Coenzyme Q10 10 MG capsule Take 10 mg by mouth daily.       losartan (COZAAR) 100 MG tablet Take 100 mg by mouth daily.     nitroGLYCERIN (NITROSTAT) 0.4 MG SL tablet Place 1 tablet (0.4 mg total) under the tongue every 5 (five) minutes as needed for chest pain. 25 tablet 3   pantoprazole (PROTONIX) 40 MG tablet Take 1 tablet (40 mg total) by mouth daily. 90 tablet 0   phenylephrine-shark liver oil-mineral oil-petrolatum (PREPARATION H) 0.25-3-14-71.9 % rectal ointment Place 1 application rectally 2 (two) times daily as needed for hemorrhoids.     sildenafil (VIAGRA) 100 MG tablet Take 50-100 mg by mouth daily as needed.     vitamin B-12 (CYANOCOBALAMIN) 500 MCG tablet Take 500 mcg by mouth daily.     vitamin C (ASCORBIC ACID) 500 MG tablet Take 500 mg by mouth daily.     No current facility-administered medications for this visit.    Allergies:   Lisinopril    Social History:  The patient  reports that he quit smoking about 53 years ago. His smoking use included cigarettes. He started smoking about 66 years ago. He has a 13 pack-year smoking history. He has never used smokeless tobacco. He reports current alcohol use of about 7.0 standard drinks of alcohol per week. He reports that he does not use drugs.   Family History:  The patient's family history includes AAA (abdominal aortic aneurysm) in his father; Dementia in his mother; Heart attack in his father; Stroke in his mother.    ROS:  Please see the history of present illness.   Otherwise, review of systems are positive for none.   All other systems are reviewed and negative.    PHYSICAL EXAM: VS:  BP (!) 154/84 (BP Location: Left Arm, Patient Position: Sitting, Cuff Size: Large)   Pulse 75   Ht 5\' 8"  (1.727 m)   Wt 195 lb 9.6 oz (88.7 kg)   SpO2 98%   BMI 29.74 kg/m  , BMI Body mass index is 29.74 kg/m. GEN: Well nourished, well developed, in no acute distress  HEENT: normal  Neck: no JVD, carotid bruits, or masses Cardiac: RRR; no  rubs, or gallops,no edema . There is 2/6 holosystolic  murmur at the apex radiating to the axilla Respiratory:  clear to auscultation bilaterally, normal work of breathing GI: soft, nontender, nondistended, + BS MS: no deformity or atrophy  Skin: warm and dry, no rash Neuro:  Strength and sensation are intact Psych: euthymic mood, full affect   EKG:  EKG is ordered today. The ekg ordered today demonstrates normal sinus rhythm with no significant ST or T wave changes.   Recent Labs: No results found for requested labs within last 365 days.    Lipid Panel    Component Value Date/Time   CHOL 89 (L) 12/28/2019 1223   TRIG 38 12/28/2019 1223   HDL 41 12/28/2019 1223   CHOLHDL 2.2 12/28/2019 1223  LDLCALC 37 12/28/2019 1223      Wt Readings from Last 3 Encounters:  09/24/23 195 lb 9.6 oz (88.7 kg)  04/10/22 193 lb (87.5 kg)  01/09/21 185 lb 6 oz (84.1 kg)        ASSESSMENT AND PLAN:  1.   Coronary artery disease involving native coronary arteries without angina: He is doing extremely well with no anginal symptoms.  Continue medical therapy.  Continue aspirin indefinitely.   2. Mitral regurgitation due to mitral valve prolapse: Most recent echocardiogram in October 2023 showed mild regurgitation.  3. Essential hypertension: Blood pressure is mildly elevated but he has not taken his blood pressure medications yet.  Continue to monitor for now and consider increasing the dose of carvedilol.  4. Hyperlipidemia: I reviewed most recent lipid profile done recently which showed an LDL of 59.  Continue atorvastatin 40 mg daily.  Liver enzymes were normal.  5.  Palpitations: His EKG shows sinus rhythm.  However, during auscultation, he had a brief run of irregular tachycardia and then went back to normal rhythm.  I am concerned about possible paroxysmal atrial fibrillation.  I requested a 2-week ZIO monitor.   Disposition:   FU with me in 6 months  Signed,  Connor Bears, MD  09/24/2023 8:23 AM    [facility]

## 2023-09-24 NOTE — Patient Instructions (Signed)
Medication Instructions:  No changes *If you need a refill on your cardiac medications before your next appointment, please call your pharmacy*   Lab Work: None ordered If you have labs (blood work) drawn today and your tests are completely normal, you will receive your results only by: MyChart Message (if you have MyChart) OR A paper copy in the mail If you have any lab test that is abnormal or we need to change your treatment, we will call you to review the results.   Testing/Procedures: None ordered   Follow-Up: At [facility], you and your health needs are our priority.  As part of our continuing mission to provide you with exceptional heart care, we have created designated Provider Care Teams.  These Care Teams include your primary Cardiologist (physician) and Advanced Practice Providers (APPs -  Physician Assistants and Nurse Practitioners) who all work together to provide you with the care you need, when you need it.  We recommend signing up for the patient portal called "MyChart".  Sign up information is provided on this After Visit Summary.  MyChart is used to connect with patients for Virtual Visits (Telemedicine).  Patients are able to view lab/test results, encounter notes, upcoming appointments, etc.  Non-urgent messages can be sent to your provider as well.   To learn more about what you can do with MyChart, go to [email or web address].    Your next appointment:   6 month(s)  Provider:   You may see Lorine Bears, MD or one of the following Advanced Practice Providers on your designated Care Team:   Nicolasa Ducking, NP Eula Listen, PA-C Cadence Fransico Michael, PA-C Charlsie Quest, NP Carlos Levering, NP    Other Instructions Connor Tyler- Long Term Monitor Instructions  Your physician has requested you wear a ZIO patch monitor for 14 days.  This is a single patch monitor. [employer] supplies one patch monitor per enrollment. Additional stickers are not available.  Please do not apply patch if you will be having a Nuclear Stress Test,  Echocardiogram, Cardiac CT, MRI, or Chest Xray during the period you would be wearing the  monitor. The patch cannot be worn during these tests. You cannot remove and re-apply the  ZIO XT patch monitor.  Your ZIO patch monitor will be mailed 3 day USPS to your address on file. It may take 3-5 days  to receive your monitor after you have been enrolled.  Once you have received your monitor, please review the enclosed instructions. Your monitor  has already been registered assigning a specific monitor serial # to you.  Billing and Patient Assistance Program Information  We have supplied [employer] with any of your insurance information on file for billing purposes. [employer] offers a sliding scale Patient Assistance Program for patients that do not have  insurance, or whose insurance does not completely cover the cost of the ZIO monitor.  You must apply for the Patient Assistance Program to qualify for this discounted rate.  To apply, please call [employer] at [phone], select option 4, select option 2, ask to apply for  Patient Assistance Program. Meredeth Ide will ask your household income, and how many people  are in your household. They will quote your out-of-pocket cost based on that information.  [employer] will also be able to set up a 45-month, interest-free payment plan if needed.  Applying the monitor   Shave hair from upper left chest.  Hold abrader disc by orange tab. Rub abrader in 40 strokes over the upper left chest  as  indicated in your monitor instructions.  Clean area with 4 enclosed alcohol pads. Let dry.  Apply patch as indicated in monitor instructions. Patch will be placed under collarbone on left  side of chest with arrow pointing upward.  Rub patch adhesive wings for 2 minutes. Remove white label marked "1". Remove the white  label marked "2". Rub patch adhesive wings for 2 additional minutes.  While  looking in a mirror, press and release button in center of patch. A small green light will  flash 3-4 times. This will be your only indicator that the monitor has been turned on.  Do not shower for the first 24 hours. You may shower after the first 24 hours.  Press the button if you feel a symptom. You will hear a small click. Record Date, Time and  Symptom in the Patient Logbook.  When you are ready to remove the patch, follow instructions on the last 2 pages of Patient  Logbook. Stick patch monitor onto the last page of Patient Logbook.  Place Patient Logbook in the blue and white box. Use locking tab on box and tape box closed  securely. The blue and white box has prepaid postage on it. Please place it in the mailbox as  soon as possible. Your physician should have your test results approximately 7 days after the  monitor has been mailed back to [facility].  Call [facility] Customer Care at [phone] if you have questions regarding  your ZIO XT patch monitor. Call them immediately if you see an orange light blinking on your  monitor.  If your monitor falls off in less than 4 days, contact our Monitor department at [phone].  If your monitor becomes loose or falls off after 4 days call [employer] at [phone] for  suggestions on securing your monitor

## 2023-09-29 DIAGNOSIS — R002 Palpitations: Secondary | ICD-10-CM | POA: Diagnosis not present

## 2023-10-29 ENCOUNTER — Other Ambulatory Visit: Payer: Self-pay | Admitting: *Deleted

## 2023-10-29 DIAGNOSIS — I340 Nonrheumatic mitral (valve) insufficiency: Secondary | ICD-10-CM

## 2023-10-29 MED ORDER — CARVEDILOL 12.5 MG PO TABS: 12.5000 mg | ORAL_TABLET | Freq: Two times a day (BID) | ORAL | 1 refills | Status: DC

## 2023-11-01 ENCOUNTER — Ambulatory Visit: Payer: Medicare Other | Attending: Cardiovascular Disease

## 2023-11-01 DIAGNOSIS — I340 Nonrheumatic mitral (valve) insufficiency: Secondary | ICD-10-CM

## 2023-11-01 LAB — ECHOCARDIOGRAM COMPLETE
AV Mean grad: 3 mm[Hg]
AV Peak grad: 4.8 mm[Hg]
Ao pk vel: 1.1 m/s
Area-P 1/2: 2.76 cm2
S' Lateral: 3.9 cm

## 2023-11-03 ENCOUNTER — Ambulatory Visit: Payer: Medicare Other | Attending: Nurse Practitioner | Admitting: Nurse Practitioner

## 2023-11-03 ENCOUNTER — Encounter: Payer: Self-pay | Admitting: Nurse Practitioner

## 2023-11-03 VITALS — BP 162/84 | HR 76 | Ht 68.0 in | Wt 195.0 lb

## 2023-11-03 DIAGNOSIS — I471 Supraventricular tachycardia, unspecified: Secondary | ICD-10-CM

## 2023-11-03 DIAGNOSIS — R002 Palpitations: Secondary | ICD-10-CM

## 2023-11-03 DIAGNOSIS — E785 Hyperlipidemia, unspecified: Secondary | ICD-10-CM

## 2023-11-03 DIAGNOSIS — I340 Nonrheumatic mitral (valve) insufficiency: Secondary | ICD-10-CM

## 2023-11-03 DIAGNOSIS — I2510 Atherosclerotic heart disease of native coronary artery without angina pectoris: Secondary | ICD-10-CM

## 2023-11-03 DIAGNOSIS — I10 Essential (primary) hypertension: Secondary | ICD-10-CM | POA: Diagnosis present

## 2023-11-03 DIAGNOSIS — I341 Nonrheumatic mitral (valve) prolapse: Secondary | ICD-10-CM | POA: Diagnosis present

## 2023-11-03 NOTE — Patient Instructions (Signed)
 Medication Instructions:  No changes *If you need a refill on your cardiac medications before your next appointment, please call your pharmacy*   Lab Work: None ordered If you have labs (blood work) drawn today and your tests are completely normal, you will receive your results only by: MyChart Message (if you have MyChart) OR A paper copy in the mail If you have any lab test that is abnormal or we need to change your treatment, we will call you to review the results.   Testing/Procedures: None ordered   Follow-Up: At [facility], you and your health needs are our priority.  As part of our continuing mission to provide you with exceptional heart care, we have created designated Provider Care Teams.  These Care Teams include your primary Cardiologist (physician) and Advanced Practice Providers (APPs -  Physician Assistants and Nurse Practitioners) who all work together to provide you with the care you need, when you need it.  We recommend signing up for the patient portal called "MyChart".  Sign up information is provided on this After Visit Summary.  MyChart is used to connect with patients for Virtual Visits (Telemedicine).  Patients are able to view lab/test results, encounter notes, upcoming appointments, etc.  Non-urgent messages can be sent to your provider as well.   To learn more about what you can do with MyChart, go to [email or web address].    Your next appointment:   12 month(s)  Provider:   You may see Lorine Bears, MD or one of the following Advanced Practice Providers on your designated Care Team:   Nicolasa Ducking, NP

## 2023-11-03 NOTE — Progress Notes (Signed)
 Office Visit    Patient Name: Connor Tyler Date of Encounter: 11/03/2023  Primary Care Provider:  Lynnea Ferrier, MD Primary Cardiologist:  Lorine Bears, MD  Chief Complaint    78 y.o. male with a history of nonobstructive CAD, mitral valve prolapse, mitral regurgitation, hypertension, hyperlipidemia, and GERD, who presents for follow-up related to palpitations.  Past Medical History  Subjective   Past Medical History:  Diagnosis Date   Allergic rhinitis    Anemia    Aortic insufficiency    a. 10/2023 Echo: Mild AI.   Asthma    Coronary artery disease    a. 07/2010 [facility]): Diffuse mild 3 vessell CAD; b. 01/2019 MV: low risk; c. 11/2019 NSTEMI/PCI: Severe RCA & OM dzs s/p DES to both locations.   ED (erectile dysfunction)    GERD (gastroesophageal reflux disease)    Heart murmur    History of MRSA infection    Hypertension    Mitral regurgitation    a. Due to mitral valve prolapse; b. 10/2023 Echo: EF 55-60%, no rwma, mild LVH, nl RV size/fxn, mild MR/AI.   Myocardial infarction (HCC) 11/10/2019   DES to RCA and OM   PSVT (paroxysmal supraventricular tachycardia) (HCC)    a. 10/2023 Zio: Predominantly sinus rhythm, avg 79 (46-194). 8 beats NSVT. 33 SVT runs, fastest 194 x 5 beats, longest 13.5 secs @ 124.   Past Surgical History:  Procedure Laterality Date   ANAL FISSURE REPAIR     CARDIAC CATHETERIZATION     [facility]   CATARACT EXTRACTION W/PHACO Left 01/25/2018   Procedure: CATARACT EXTRACTION PHACO AND INTRAOCULAR LENS PLACEMENT (IOC);  Surgeon: Galen Manila, MD;  Location: [facility] ORS;  Service: Ophthalmology;  Laterality: Left;  [place] 00:49 AP% 13.3 CDE 6.60 Fluid pack lot # [id number]   CATARACT EXTRACTION W/PHACO Right 11/12/2020   Procedure: CATARACT EXTRACTION PHACO AND INTRAOCULAR LENS PLACEMENT (IOC) RIGHT 16.73 01:37.2;  Surgeon: Galen Manila, MD;  Location: [facility];  Service: Ophthalmology;  Laterality: Right;    CATARACT EXTRACTION W/PHACO Right    COLONOSCOPY     COLONOSCOPY WITH PROPOFOL N/A 09/14/2018   Procedure: COLONOSCOPY WITH PROPOFOL;  Surgeon: Scot Jun, MD;  Location: [facility] END[facility]COPY;  Service: Endoscopy;  Laterality: N/A;   ESOPHAGOGASTRODUODENOSCOPY (EGD) WITH PROPOFOL N/A 09/14/2018   Procedure: ESOPHAGOGASTRODUODENOSCOPY (EGD) WITH PROPOFOL;  Surgeon: Scot Jun, MD;  Location: [facility] ENDOSCOPY;  Service: Endoscopy;  Laterality: N/A;  IV ZOSYN   TONSILLECTOMY     wisdom teeth resected      Allergies  Allergies  Allergen Reactions   Lisinopril Cough      History of Present Illness      78 y.o. y/o male with a history of nonobstructive CAD, mitral valve prolapse, mitral regurgitation, hypertension, hyperlipidemia, and GERD.  He was previously evaluated at [facility] clinic in 2011 due to mitral regurgitation and mitral valve prolapse.  It was determined that his mitral regurgitation was moderate and not severe, and thus surgery was not needed.  Diagnostic catheterization at the time showed mild, three-vessel CAD.  In March 2021, he was on vacation in [facility], when he suffered a non-STEMI.  Diagnostic catheterization showed severe stenosis of the right coronary artery and obtuse marginal.  He underwent PCI and drug-eluting stent placement to both RCA and OM.  EF was normal.  He has been followed with serial echocardiograms over the years with stable, mild much regurgitation.  In January 2025, he reported  palpitations.  ZIO monitoring showed predominantly sinus rhythm with 33 SVT runs up to 194 bpm with the longest run lasting 13-1/2 seconds.  Rare PACs and PVCs were noted.  This was followed by an echocardiogram which showed an EF of 55 to 60% without regional wall motion abnormalities, normal RV size and function, mild MR, and mild AI.  Carvedilol was titrated to 12.5 mg twice daily.    Since his last visit, [name] has mostly felt well.  He notices brief "speed-ups" of  his heart occurring couple of times a day, lasting a few seconds, resolving spontaneously.  He says it is not particularly bothersome.  He is not sure he ever really noticed it before, but has been paying more attention since her recent monitoring.  Other than palpitations, he does not experience presyncope, syncope, chest pain, or dyspnea during the episodes.  He has not been exercising as much as he would like to but is looking forward to the weather turning warmer.  He he is planning on playing golf later today and hopes to be playing at least once a week as the weather warms up.  He recently noted some dyspnea exertion when walking up 3 flights of steps but says he recovers quickly.  He denies chest pain and does not experience dyspnea with normal activity or walking on flat surfaces.  He denies PND, orthopnea, edema, or early satiety. Objective  Home Medications    Current Outpatient Medications  Medication Sig Dispense Refill   albuterol (VENTOLIN HFA) 108 (90 Base) MCG/ACT inhaler Inhale 2 puffs into the lungs every 4 (four) hours as needed for wheezing or shortness of breath.      amLODipine (NORVASC) 5 MG tablet Take 1 tablet (5 mg total) by mouth daily. 90 tablet 3   aspirin EC 81 MG tablet Take 81 mg by mouth daily.      atorvastatin (LIPITOR) 40 MG tablet Take 1 tablet (40 mg total) by mouth daily. 90 tablet 3   carvedilol (COREG) 12.5 MG tablet Take 1 tablet (12.5 mg total) by mouth 2 (two) times daily. 180 tablet 1   cetirizine (ZYRTEC) 10 MG tablet Take 10 mg by mouth daily.      Coenzyme Q10 10 MG capsule Take 10 mg by mouth daily.      cyanocobalamin 1000 MCG tablet Take 1,000 mcg by mouth daily.     losartan (COZAAR) 100 MG tablet Take 100 mg by mouth daily.     nitroGLYCERIN (NITROSTAT) 0.4 MG SL tablet Place 1 tablet (0.4 mg total) under the tongue every 5 (five) minutes as needed for chest pain. 25 tablet 3   pantoprazole (PROTONIX) 40 MG tablet Take 1 tablet (40 mg total) by  mouth daily. 90 tablet 0   phenylephrine-shark liver oil-mineral oil-petrolatum (PREPARATION H) 0.25-3-14-71.9 % rectal ointment Place 1 application rectally 2 (two) times daily as needed for hemorrhoids.     sildenafil (VIAGRA) 100 MG tablet Take 50-100 mg by mouth daily as needed.     vitamin C (ASCORBIC ACID) 500 MG tablet Take 500 mg by mouth daily.     No current facility-administered medications for this visit.     Physical Exam    VS:  BP (!) 162/84   Pulse 76   Ht 5\' 8"  (1.727 m)   Wt 195 lb (88.5 kg)   SpO2 95%   BMI 29.65 kg/m  , BMI Body mass index is 29.65 kg/m.     Vitals:  11/03/23 0857 11/03/23 0922  BP: (!) 142/82 (!) 162/84  Pulse: 76   SpO2: 95%       GEN: Well nourished, well developed, in no acute distress. HEENT: normal. Neck: Supple, no JVD, carotid bruits, or masses. Cardiac: RRR, 2/6 systolic murmur heard along the left sternal border and apex.  No rubs or gallops.  No clubbing, cyanosis, edema.  Radials 2+/PT 2+ and equal bilaterally.  Respiratory:  Respirations regular and unlabored, clear to auscultation bilaterally. GI: Soft, nontender, nondistended, BS + x 4. MS: no deformity or atrophy. Skin: warm and dry, no rash. Neuro:  Strength and sensation are intact. Psych: Normal affect.  Accessory Clinical Findings    Labs dated September 16, 2023 from [facility] Everywhere:  Total cholesterol 123, triglyceride 79, HDL 48.1, LDL 59 Hemoglobin 13.4, hematocrit 37.8, WBC 5.1, platelets 232 Sodium 147, potassium 3.8, chloride 109, CO2 28.4, BUN 21, creatinine 1.0, glucose 93 Calcium 9.5, albumin 4.6, total protein 6.7 Total bilirubin 0.5, alkaline phosphatase 92, AST 22, ALT 23 TSH 4.64 PSA 1.29    Assessment & Plan    1.  Palpitations/PSVT:  recent zio w/ 33 brief runs of PSVT.  Echo with normal LV function and mild MR/AI.  Carvedilol increased to 12.5 mg twice daily following monitoring.  Patient is not really sure he is seen much benefit to titrating  carvedilol but also notes that he was never particularly symptomatic prior to findings on monitor.  He has been paying more attention to his heart rhythm since wearing the monitor and does experience brief "speed-ups" of his heart a few times a day.  These are typically brief and consistent with what was found on the monitor.  We discussed potential for titrating carvedilol further and at this time, he will continue to monitor.  Encouraged caffeine avoidance and anticoagulation.  2.  Mitral regurgitation due to mitral valve prolapse: Echo earlier this month with mild MR normal LV function.  3.  Coronary artery disease: Status post non-STEMI and RCA and obtuse marginal stenting in 2021.  He has done well without chest pain.  He notes that he has not been exercising and is looking forward to whether warming up.  He plans to play golf today.  Encouraged to 150 minutes of moderate activity weekly.  He remains on aspirin, statin, beta-blocker, ARB.  4.  Primary hypertension: Blood pressure is elevated today on 2 readings however, consistent with prior visit, he is yet to take his morning medicines.  He checks his pressures at home and notes that he is frequently in the 1 teens though he has noted pressures in the 140s as well.  Encouraged him to monitor blood pressure over the next 2 weeks and contact us with trends.  If he remains elevated and provided heart rate allows, would look to titrate carvedilol to 18.75 mg twice daily in the setting of #1.  Otherwise continue ARB and amlodipine.  Blood  5.  Hyperlipidemia: LDL of 59 in January with normal LFTs.  He remains on statin therapy.  6.  Disposition: Follow-up in 1 year or sooner if necessary.  Patient to contact us with blood pressure recordings.  Nicolasa Ducking, NP 11/03/2023, 9:23 AM

## 2023-11-26 ENCOUNTER — Other Ambulatory Visit: Payer: Self-pay | Admitting: Cardiovascular Disease

## 2023-12-08 ENCOUNTER — Other Ambulatory Visit: Payer: Self-pay

## 2023-12-08 MED ORDER — AMLODIPINE BESYLATE 5 MG PO TABS: 5.0000 mg | ORAL_TABLET | Freq: Every day | ORAL | 3 refills | Status: AC

## 2023-12-23 ENCOUNTER — Other Ambulatory Visit: Payer: Self-pay

## 2023-12-23 MED ORDER — CARVEDILOL 12.5 MG PO TABS: 12.5000 mg | ORAL_TABLET | Freq: Two times a day (BID) | ORAL | 3 refills | Status: AC

## 2024-05-01 ENCOUNTER — Telehealth: Payer: Self-pay | Admitting: *Deleted

## 2024-05-01 NOTE — Telephone Encounter (Signed)
 Patient has been made aware and will stop the medication. He will call back if he develops GERD symptoms.   Darron Deatrice LABOR, MD  Estelle Olam GRADE, RN We have been prescribing pantoprazole  40 mg once daily to the patient.  It is preferred that he does not take this medication long-term if he is not having any GERD symptoms.  If he has no symptoms, I suggest stopping the medication.
